# Patient Record
Sex: Female | Born: 1960
Health system: Southern US, Community
[De-identification: ages and names within clinical notes are randomized; demographics above are authoritative.]

## PROBLEM LIST (undated history)

## (undated) DIAGNOSIS — Z972 Presence of dental prosthetic device (complete) (partial): Secondary | ICD-10-CM

## (undated) DIAGNOSIS — K219 Gastro-esophageal reflux disease without esophagitis: Secondary | ICD-10-CM

## (undated) DIAGNOSIS — I10 Essential (primary) hypertension: Secondary | ICD-10-CM

## (undated) DIAGNOSIS — I6523 Occlusion and stenosis of bilateral carotid arteries: Secondary | ICD-10-CM

## (undated) DIAGNOSIS — W57XXXA Bitten or stung by nonvenomous insect and other nonvenomous arthropods, initial encounter: Secondary | ICD-10-CM

## (undated) DIAGNOSIS — F419 Anxiety disorder, unspecified: Secondary | ICD-10-CM

## (undated) DIAGNOSIS — R42 Dizziness and giddiness: Secondary | ICD-10-CM

## (undated) DIAGNOSIS — J439 Emphysema, unspecified: Secondary | ICD-10-CM

## (undated) DIAGNOSIS — E7800 Pure hypercholesterolemia, unspecified: Secondary | ICD-10-CM

## (undated) HISTORY — DX: Occlusion and stenosis of bilateral carotid arteries: I65.23

## (undated) HISTORY — PX: HEMORRHOID SURGERY: SHX153

## (undated) HISTORY — DX: Bitten or stung by nonvenomous insect and other nonvenomous arthropods, initial encounter: W57.XXXA

## (undated) HISTORY — DX: Anxiety disorder, unspecified: F41.9

## (undated) HISTORY — PX: ABDOMINAL HYSTERECTOMY: SHX81

## (undated) HISTORY — DX: Emphysema, unspecified: J43.9

---

## 2004-04-03 ENCOUNTER — Emergency Department: Payer: Self-pay | Admitting: Emergency Medicine

## 2006-01-17 ENCOUNTER — Ambulatory Visit: Payer: Self-pay | Admitting: Unknown Physician Specialty

## 2006-02-16 ENCOUNTER — Ambulatory Visit: Payer: Self-pay

## 2006-11-21 ENCOUNTER — Ambulatory Visit: Payer: Self-pay | Admitting: Emergency Medicine

## 2006-11-23 ENCOUNTER — Ambulatory Visit: Payer: Self-pay | Admitting: Internal Medicine

## 2006-12-03 ENCOUNTER — Ambulatory Visit: Payer: Self-pay | Admitting: Family Medicine

## 2007-02-21 ENCOUNTER — Ambulatory Visit: Payer: Self-pay | Admitting: Family Medicine

## 2009-09-30 ENCOUNTER — Ambulatory Visit: Payer: Self-pay | Admitting: Family Medicine

## 2010-03-17 ENCOUNTER — Emergency Department: Payer: Self-pay | Admitting: Emergency Medicine

## 2011-09-28 ENCOUNTER — Emergency Department: Payer: Self-pay | Admitting: *Deleted

## 2011-09-28 ENCOUNTER — Ambulatory Visit: Payer: Self-pay | Admitting: Family Medicine

## 2012-04-16 ENCOUNTER — Ambulatory Visit: Payer: Self-pay | Admitting: Family Medicine

## 2012-08-12 ENCOUNTER — Ambulatory Visit: Payer: Self-pay | Admitting: Internal Medicine

## 2012-08-13 ENCOUNTER — Ambulatory Visit: Payer: Self-pay | Admitting: Family Medicine

## 2013-11-14 ENCOUNTER — Ambulatory Visit: Payer: Self-pay | Admitting: Gastroenterology

## 2015-01-07 ENCOUNTER — Encounter: Payer: Self-pay | Admitting: *Deleted

## 2015-01-07 ENCOUNTER — Ambulatory Visit
Admission: EM | Admit: 2015-01-07 | Discharge: 2015-01-07 | Disposition: A | Discharge status: Home / Self Care | Payer: Medicare Other | Attending: Internal Medicine | Admitting: Internal Medicine

## 2015-01-07 DIAGNOSIS — L719 Rosacea, unspecified: Secondary | ICD-10-CM

## 2015-01-07 HISTORY — DX: Essential (primary) hypertension: I10

## 2015-01-07 MED ORDER — MUPIROCIN CALCIUM 2 % EX CREA: 1.0000 "application " | TOPICAL_CREAM | Freq: Every day | CUTANEOUS | Status: DC

## 2015-01-07 MED ORDER — METRONIDAZOLE 0.75 % EX LOTN
5.0000 mL | TOPICAL_LOTION | Freq: Every evening | CUTANEOUS | Status: DC | PRN
Start: 2015-01-07 — End: 2015-04-23

## 2015-01-07 MED ORDER — DOXYCYCLINE HYCLATE 100 MG PO CAPS: 100.0000 mg | ORAL_CAPSULE | Freq: Two times a day (BID) | ORAL | Status: DC

## 2015-01-07 NOTE — Discharge Instructions (Signed)
Rosacea Rosacea is a long-term (chronic) condition that affects the skin of the face (cheeks, nose, brow, and chin) and sometimes the eyes. Rosacea causes the blood vessels near the surface of the skin to enlarge, resulting in redness. This condition usually begins after age 55. It occurs most often in light-skinned women. Without treatment, rosacea tends to get worse over time. There is no cure for rosacea, but treatment can help control your symptoms. CAUSES  The cause is unknown. It is thought that some people may inherit a tendency to develop rosacea. Certain triggers can make your rosacea worse, including:  Hot baths.  Exercise.  Sunlight.  Very hot or cold temperatures.  Hot or spicy foods and drinks.  Drinking alcohol.  Stress.  Taking blood pressure medicine.  Long-term use of topical steroids on the face. SYMPTOMS   Redness of the face.  Red bumps or pimples on the face.  Red, enlarged nose (rhinophyma).  Blushing easily.  Red lines on the skin.  Irritated or burning feeling in the eyes.  Swollen eyelids. DIAGNOSIS  Your caregiver can usually tell what is wrong by asking about your symptoms and performing a physical exam. TREATMENT  Avoiding triggers is an important part of treatment. You will also need to see a skin specialist (dermatologist) who can develop a treatment plan for you. The goals of treatment are to control your condition and to improve the appearance of your skin. It may take several weeks or months of treatment before you notice an improvement in your skin. Even after your skin improves, you will likely need to continue treatment to prevent your rosacea from coming back. Treatment methods may include:  Using sunscreen or sunblock daily to protect the skin.  Antibiotic medicine, such as metronidazole, applied directly to the skin.  Antibiotics taken by mouth. This is usually prescribed if you have eye problems from your rosacea.  Laser surgery  to improve the appearance of the skin. This surgery can reduce the appearance of red lines on the skin and can remove excess tissue from the nose to reduce its size. HOME CARE INSTRUCTIONS  Avoid things that seem to trigger your flare-ups.  If you are given antibiotics, take them as directed. Finish them even if you start to feel better.  Use a gentle facial cleanser that does not contain alcohol.  You may use a mild facial moisturizer.  Use a sunscreen or sunblock with SPF 30 or greater.  Wear a green-tinted foundation powder to conceal redness, if needed. Choose cosmetics that are noncomedogenic. This means they do not block your pores.  If your eyelids are affected, apply warm compresses to the eyelids. Do this up to 4 times a day or as directed by your caregiver. SEEK MEDICAL CARE IF:  Your skin problems get worse.  You feel depressed.  You lose your appetite.  You have trouble concentrating.  You have problems with your eyes, such as redness or itching. MAKE SURE YOU:  Understand these instructions.  Will watch your condition.  Will get help right away if you are not doing well or get worse. Document Released: 07/20/2004 Document Revised: 12/12/2011 Document Reviewed: 05/23/2011 [facility] Patient Information 2015 [place], [place]. This information is not intended to replace advice given to you by your health care provider. Make sure you discuss any questions you have with your health care provider.

## 2015-01-07 NOTE — ED Provider Notes (Signed)
CSN: [id number]     Arrival date & time 01/07/15  1709 History   First MD Initiated Contact with Patient 01/07/15 1750     Chief Complaint  Patient presents with  . Rash   (Consider location/radiation/quality/duration/timing/severity/associated sxs/prior Treatment) HPI   54 yo F presents concerned about rash noted on her cheeks increasing over the past few weeks. Under a lot of stress with issues her daughter is coping with. Used to be a smoker but stopped years ago. Stays inside a lot. Has been":picking at the bumps" on her face, everything has increased as the daughter's stress has increased. Not drinking enough water Past Medical History  Diagnosis Date  . Hypertension    History reviewed. No pertinent past surgical history. History reviewed. No pertinent family history. History  Substance Use Topics  . Smoking status: Former Research scientist (life sciences)  . Smokeless tobacco: Not on file  . Alcohol Use: No   OB History    No data available     Review of Systems Constitutional -afebrile Eyes-denies visual changes ENT- normal voice,denies sore throat CV-denies chest pain Resp-denies SOB GI- negative for nausea,vomiting, diarrhea GU- negative for dysuria MSK- negative for back pain, ambulatory Skin- see HPI Neuro- negative headache,focal weakness or numbness   Allergies  Penicillins  Home Medications   Prior to Admission medications   Medication Sig Start Date End Date Taking? Authorizing Provider  busPIRone (BUSPAR) 5 MG tablet Take 5 mg by mouth as needed.   Yes Historical Provider, MD  doxycycline (VIBRAMYCIN) 100 MG capsule Take 1 capsule (100 mg total) by mouth 2 (two) times daily. 01/07/15   Jan Fireman, PA-C  METRONIDAZOLE, TOPICAL, 0.75 % LOTN Apply 5 mLs topically at bedtime as needed. 01/07/15   Jan Fireman, PA-C  mupirocin cream (BACTROBAN) 2 % Apply 1 application topically daily. Very small amount to areas of infection once daily- fill RX with ointment ! 01/07/15   Jan Fireman,  PA-C   BP 132/81 mmHg  Pulse 91  Temp(Src) 97.7 F (36.5 C) (Oral)  Ht 5\' 2"  (1.575 m)  Wt 125 lb (56.7 kg)  BMI 22.86 kg/m2  SpO2 98% Physical Exam   Constitutional -alert and oriented,well appearing and in no acute distress Head-atraumatic, normocephalic Eyes- conjunctiva normal, EOMI ,conjugate gaze Nose- no congestion or rhinorrhea Mouth/throat- mucous membranes moist , Neck- supple without glandular enlargement CV- regular rate, grossly normal heart sounds,  Resp-no distress, normal respiratory effort, GI- ,no distention GU- not examined MSK- no tender, normal ROM, all extremities, ambulatory, self-care Neuro- normal speech and language, no gross focal neurological deficit appreciated,  Skin-warm,dry ,intact; facial erythema, mild malar rash noted both cheeks, reports tiny pustules she has been squeezing, right nasolabial fold mild inflammation; has infected inclusion cyst left upper arm that has been pinched and stuck with needles Psych-mood and affect grossly normal; speech and behavior grossly normal- habit of frequently touching face ED Course  Procedures (including critical care time) Labs Review Labs Reviewed - No data to display  Imaging Review No results found.   MDM   1. Rosacea    Plan: 1.  diagnosis reviewed with patient-discussed Rosacea and adult acne aggravated by handtouch contamination and picking at it. Informational handouts reviewed and given to patient. Gentle face washing with non-irritating facial washes. May use mupirocin specifically on pustules and on lesion left upper arm.  2. Rx as per orders;oral Rx for a week and topical metronidazole lotion fot face with clean hands QHS risks, benefits, potential  side effects reviewed with patient 3. Recommend supportive treatment with increased water-no soda-improved sleep hygiene-wash pillowcases twice weekly-keep hands off face  4.Coordinate FU with PCP so referral can be made to dermatology and she can  have a chain of care. 4. F/u prn if symptoms worsen or don't improve   Discharge Medication List as of 01/07/2015  6:17 PM    START taking these medications   Details  doxycycline (VIBRAMYCIN) 100 MG capsule Take 1 capsule (100 mg total) by mouth 2 (two) times daily., Starting 01/07/2015, Until Discontinued, Print    METRONIDAZOLE, TOPICAL, 0.75 % LOTN Apply 5 mLs topically at bedtime as needed., Starting 01/07/2015, Until Discontinued, Print    mupirocin cream (BACTROBAN) 2 % Apply 1 application topically daily. Very small amount to areas of infection once daily- fill RX with ointment !, Starting 01/07/2015, Until Discontinued, Print         Jan Fireman, PA-C 01/09/15 1959

## 2015-01-07 NOTE — ED Notes (Signed)
Pt states "rash started about 1 week ago, on right cheek, felt a knot under skin before it came to surface"

## 2015-03-05 DIAGNOSIS — M531 Cervicobrachial syndrome: Secondary | ICD-10-CM | POA: Diagnosis not present

## 2015-03-05 DIAGNOSIS — M9902 Segmental and somatic dysfunction of thoracic region: Secondary | ICD-10-CM | POA: Diagnosis not present

## 2015-03-05 DIAGNOSIS — M9901 Segmental and somatic dysfunction of cervical region: Secondary | ICD-10-CM | POA: Diagnosis not present

## 2015-03-05 DIAGNOSIS — G540 Brachial plexus disorders: Secondary | ICD-10-CM | POA: Diagnosis not present

## 2015-03-12 DIAGNOSIS — M9902 Segmental and somatic dysfunction of thoracic region: Secondary | ICD-10-CM | POA: Diagnosis not present

## 2015-03-12 DIAGNOSIS — M9901 Segmental and somatic dysfunction of cervical region: Secondary | ICD-10-CM | POA: Diagnosis not present

## 2015-03-12 DIAGNOSIS — G540 Brachial plexus disorders: Secondary | ICD-10-CM | POA: Diagnosis not present

## 2015-03-12 DIAGNOSIS — M531 Cervicobrachial syndrome: Secondary | ICD-10-CM | POA: Diagnosis not present

## 2015-03-15 DIAGNOSIS — G540 Brachial plexus disorders: Secondary | ICD-10-CM | POA: Diagnosis not present

## 2015-03-15 DIAGNOSIS — M531 Cervicobrachial syndrome: Secondary | ICD-10-CM | POA: Diagnosis not present

## 2015-03-15 DIAGNOSIS — M9902 Segmental and somatic dysfunction of thoracic region: Secondary | ICD-10-CM | POA: Diagnosis not present

## 2015-03-15 DIAGNOSIS — M9901 Segmental and somatic dysfunction of cervical region: Secondary | ICD-10-CM | POA: Diagnosis not present

## 2015-03-17 DIAGNOSIS — M9901 Segmental and somatic dysfunction of cervical region: Secondary | ICD-10-CM | POA: Diagnosis not present

## 2015-03-17 DIAGNOSIS — M9902 Segmental and somatic dysfunction of thoracic region: Secondary | ICD-10-CM | POA: Diagnosis not present

## 2015-03-17 DIAGNOSIS — G540 Brachial plexus disorders: Secondary | ICD-10-CM | POA: Diagnosis not present

## 2015-03-17 DIAGNOSIS — M531 Cervicobrachial syndrome: Secondary | ICD-10-CM | POA: Diagnosis not present

## 2015-03-24 DIAGNOSIS — M531 Cervicobrachial syndrome: Secondary | ICD-10-CM | POA: Diagnosis not present

## 2015-03-24 DIAGNOSIS — G540 Brachial plexus disorders: Secondary | ICD-10-CM | POA: Diagnosis not present

## 2015-03-24 DIAGNOSIS — M9901 Segmental and somatic dysfunction of cervical region: Secondary | ICD-10-CM | POA: Diagnosis not present

## 2015-03-24 DIAGNOSIS — M9902 Segmental and somatic dysfunction of thoracic region: Secondary | ICD-10-CM | POA: Diagnosis not present

## 2015-04-23 ENCOUNTER — Ambulatory Visit (INDEPENDENT_AMBULATORY_CARE_PROVIDER_SITE_OTHER): Payer: Medicare Other | Admitting: Family Medicine

## 2015-04-23 ENCOUNTER — Encounter: Payer: Self-pay | Admitting: Family Medicine

## 2015-04-23 VITALS — BP 111/75 | HR 82 | Temp 98.2°F | Ht 60.25 in | Wt 143.2 lb

## 2015-04-23 DIAGNOSIS — B349 Viral infection, unspecified: Secondary | ICD-10-CM

## 2015-04-23 DIAGNOSIS — M542 Cervicalgia: Secondary | ICD-10-CM | POA: Diagnosis not present

## 2015-04-23 DIAGNOSIS — E785 Hyperlipidemia, unspecified: Secondary | ICD-10-CM | POA: Diagnosis not present

## 2015-04-23 DIAGNOSIS — Z720 Tobacco use: Secondary | ICD-10-CM

## 2015-04-23 DIAGNOSIS — Z5181 Encounter for therapeutic drug level monitoring: Secondary | ICD-10-CM

## 2015-04-23 DIAGNOSIS — I10 Essential (primary) hypertension: Secondary | ICD-10-CM

## 2015-04-23 DIAGNOSIS — R635 Abnormal weight gain: Secondary | ICD-10-CM

## 2015-04-23 DIAGNOSIS — M25551 Pain in right hip: Secondary | ICD-10-CM | POA: Diagnosis not present

## 2015-04-23 DIAGNOSIS — K5909 Other constipation: Secondary | ICD-10-CM | POA: Diagnosis not present

## 2015-04-23 MED ORDER — VARENICLINE TARTRATE 0.5 MG X 11 & 1 MG X 42 PO MISC: ORAL | Status: DC

## 2015-04-23 MED ORDER — NAPROXEN 375 MG PO TABS: 375.0000 mg | ORAL_TABLET | Freq: Two times a day (BID) | ORAL | Status: DC

## 2015-04-23 MED ORDER — BUSPIRONE HCL 5 MG PO TABS: 2.5000 mg | ORAL_TABLET | Freq: Three times a day (TID) | ORAL | Status: DC | PRN

## 2015-04-23 MED ORDER — LISINOPRIL-HYDROCHLOROTHIAZIDE 10-12.5 MG PO TABS: 0.5000 | ORAL_TABLET | Freq: Every day | ORAL | Status: DC

## 2015-04-23 NOTE — Patient Instructions (Addendum)
Try to use PLAIN allergy medicine without the decongestant Avoid: phenylephrine, phenylpropanolamine, and pseudoephredine Try vitamin C (orange juice if not diabetic or vitamin C tablets) and drink green tea to help your immune system during your illness Get plenty of rest and hydration  Return for a complete physical in about a month (or when convenient)  You can have the xray of your neck done at [facility]  Use the new anti-inflammatory for neck and hip discomfort; take it every day for the first week, and then just as needed  Try turmeric as a natural anti-inflammatory (for pain and arthritis). It comes in capsules where you buy aspirin and fish oil, but also as a spice where you buy pepper and garlic powder.  Do start Chantix and then pick a quit date about 7-8 days after and stick with it; when you get near the end of the starter pack, call us and we'll get you the continuing pack prescription (most people take Chantix for about 3 months)  Smoking Cessation, Tips for Success If you are ready to quit smoking, congratulations! You have chosen to help yourself be healthier. Cigarettes bring nicotine, tar, carbon monoxide, and other irritants into your body. Your lungs, heart, and blood vessels will be able to work better without these poisons. There are many different ways to quit smoking. Nicotine gum, nicotine patches, a nicotine inhaler, or nicotine nasal spray can help with physical craving. Hypnosis, support groups, and medicines help break the habit of smoking. WHAT THINGS CAN I DO TO MAKE QUITTING EASIER?  Here are some tips to help you quit for good:  Pick a date when you will quit smoking completely. Tell all of your friends and family about your plan to quit on that date.  Do not try to slowly cut down on the number of cigarettes you are smoking. Pick a quit date and quit smoking completely starting on that day.  Throw away all cigarettes.   Clean and remove  all ashtrays from your home, work, and car.  On a card, write down your reasons for quitting. Carry the card with you and read it when you get the urge to smoke.  Cleanse your body of nicotine. Drink enough water and fluids to keep your urine clear or pale yellow. Do this after quitting to flush the nicotine from your body.  Learn to predict your moods. Do not let a bad situation be your excuse to have a cigarette. Some situations in your life might tempt you into wanting a cigarette.  Never have "just one" cigarette. It leads to wanting another and another. Remind yourself of your decision to quit.  Change habits associated with smoking. If you smoked while driving or when feeling stressed, try other activities to replace smoking. Stand up when drinking your coffee. Brush your teeth after eating. Sit in a different chair when you read the paper. Avoid alcohol while trying to quit, and try to drink fewer caffeinated beverages. Alcohol and caffeine may urge you to smoke.  Avoid foods and drinks that can trigger a desire to smoke, such as sugary or spicy foods and alcohol.  Ask people who smoke not to smoke around you.  Have something planned to do right after eating or having a cup of coffee. For example, plan to take a walk or exercise.  Try a relaxation exercise to calm you down and decrease your stress. Remember, you may be tense and nervous for the first 2 weeks after  you quit, but this will pass.  Find new activities to keep your hands busy. Play with a pen, coin, or rubber band. Doodle or draw things on paper.  Brush your teeth right after eating. This will help cut down on the craving for the taste of tobacco after meals. You can also try mouthwash.   Use oral substitutes in place of cigarettes. Try using lemon drops, carrots, cinnamon sticks, or chewing gum. Keep them handy so they are available when you have the urge to smoke.  When you have the urge to smoke, try deep  breathing.  Designate your home as a nonsmoking area.  If you are a heavy smoker, ask your health care provider about a prescription for nicotine chewing gum. It can ease your withdrawal from nicotine.  Reward yourself. Set aside the cigarette money you save and buy yourself something nice.  Look for support from others. Join a support group or smoking cessation program. Ask someone at home or at work to help you with your plan to quit smoking.  Always ask yourself, "Do I need this cigarette or is this just a reflex?" Tell yourself, "Today, I choose not to smoke," or "I do not want to smoke." You are reminding yourself of your decision to quit.  Do not replace cigarette smoking with electronic cigarettes (commonly called e-cigarettes). The safety of e-cigarettes is unknown, and some may contain harmful chemicals.  If you relapse, do not give up! Plan ahead and think about what you will do the next time you get the urge to smoke. HOW WILL I FEEL WHEN I QUIT SMOKING? You may have symptoms of withdrawal because your body is used to nicotine (the addictive substance in cigarettes). You may crave cigarettes, be irritable, feel very hungry, cough often, get headaches, or have difficulty concentrating. The withdrawal symptoms are only temporary. They are strongest when you first quit but will go away within 10-14 days. When withdrawal symptoms occur, stay in control. Think about your reasons for quitting. Remind yourself that these are signs that your body is healing and getting used to being without cigarettes. Remember that withdrawal symptoms are easier to treat than the major diseases that smoking can cause.  Even after the withdrawal is over, expect periodic urges to smoke. However, these cravings are generally short lived and will go away whether you smoke or not. Do not smoke! WHAT RESOURCES ARE AVAILABLE TO HELP ME QUIT SMOKING? Your health care provider can direct you to community resources or  hospitals for support, which may include:  Group support.  Education.  Hypnosis.  Therapy.   This information is not intended to replace advice given to you by your health care provider. Make sure you discuss any questions you have with your health care provider.   Document Released: 03/10/2004 Document Revised: 07/03/2014 Document Reviewed: 11/28/2012 Elsevier Interactive Patient Education [employer].
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
## 2015-04-24 LAB — CBC WITH DIFFERENTIAL/PLATELET
BASOS: 1 %
Basophils Absolute: 0.1 10*3/uL (ref 0.0–0.2)
EOS (ABSOLUTE): 0.2 10*3/uL (ref 0.0–0.4)
EOS: 3 %
HEMATOCRIT: 44.2 % (ref 34.0–46.6)
HEMOGLOBIN: 15.4 g/dL (ref 11.1–15.9)
IMMATURE GRANS (ABS): 0 10*3/uL (ref 0.0–0.1)
IMMATURE GRANULOCYTES: 0 %
LYMPHS: 42 %
Lymphocytes Absolute: 3.9 10*3/uL — ABNORMAL HIGH (ref 0.7–3.1)
MCH: 28.9 pg (ref 26.6–33.0)
MCHC: 34.8 g/dL (ref 31.5–35.7)
MCV: 83 fL (ref 79–97)
MONOCYTES: 12 %
Monocytes Absolute: 1.1 10*3/uL — ABNORMAL HIGH (ref 0.1–0.9)
NEUTROS PCT: 42 %
Neutrophils Absolute: 3.8 10*3/uL (ref 1.4–7.0)
Platelets: 235 10*3/uL (ref 150–379)
RBC: 5.32 x10E6/uL — ABNORMAL HIGH (ref 3.77–5.28)
RDW: 14.2 % (ref 12.3–15.4)
WBC: 9.1 10*3/uL (ref 3.4–10.8)

## 2015-04-24 LAB — LIPID PANEL W/O CHOL/HDL RATIO
Cholesterol, Total: 277 mg/dL — ABNORMAL HIGH (ref 100–199)
HDL: 45 mg/dL (ref >39)
LDL CALC: 186 mg/dL — AB (ref 0–99)
Triglycerides: 231 mg/dL — ABNORMAL HIGH (ref 0–149)
VLDL CHOLESTEROL CAL: 46 mg/dL — AB (ref 5–40)

## 2015-04-24 LAB — TSH: TSH: 1.09 u[IU]/mL (ref 0.450–4.500)

## 2015-04-24 LAB — COMPREHENSIVE METABOLIC PANEL
ALT: 45 IU/L — AB (ref 0–32)
AST: 27 IU/L (ref 0–40)
Albumin/Globulin Ratio: 1.8 (ref 1.1–2.5)
Albumin: 4.3 g/dL (ref 3.5–5.5)
Alkaline Phosphatase: 132 IU/L — ABNORMAL HIGH (ref 39–117)
BUN/Creatinine Ratio: 22 (ref 9–23)
BUN: 17 mg/dL (ref 6–24)
Bilirubin Total: 0.4 mg/dL (ref 0.0–1.2)
CO2: 24 mmol/L (ref 18–29)
Calcium: 9.2 mg/dL (ref 8.7–10.2)
Chloride: 102 mmol/L (ref 97–106)
Creatinine, Ser: 0.79 mg/dL (ref 0.57–1.00)
GFR calc Af Amer: 97 mL/min/{1.73_m2} (ref >59)
GFR, EST NON AFRICAN AMERICAN: 85 mL/min/{1.73_m2} (ref >59)
Globulin, Total: 2.4 g/dL (ref 1.5–4.5)
Glucose: 91 mg/dL (ref 65–99)
Potassium: 4.1 mmol/L (ref 3.5–5.2)
Sodium: 141 mmol/L (ref 136–144)
Total Protein: 6.7 g/dL (ref 6.0–8.5)

## 2015-04-28 DIAGNOSIS — Z5181 Encounter for therapeutic drug level monitoring: Secondary | ICD-10-CM | POA: Insufficient documentation

## 2015-04-28 DIAGNOSIS — E785 Hyperlipidemia, unspecified: Secondary | ICD-10-CM | POA: Insufficient documentation

## 2015-04-28 DIAGNOSIS — R635 Abnormal weight gain: Secondary | ICD-10-CM | POA: Insufficient documentation

## 2015-04-28 DIAGNOSIS — Z720 Tobacco use: Secondary | ICD-10-CM | POA: Insufficient documentation

## 2015-04-28 DIAGNOSIS — K5900 Constipation, unspecified: Secondary | ICD-10-CM | POA: Insufficient documentation

## 2015-04-28 DIAGNOSIS — I10 Essential (primary) hypertension: Secondary | ICD-10-CM | POA: Insufficient documentation

## 2015-04-28 DIAGNOSIS — M25559 Pain in unspecified hip: Secondary | ICD-10-CM | POA: Insufficient documentation

## 2015-04-28 NOTE — Assessment & Plan Note (Signed)
Check thyroid studies 

## 2015-04-28 NOTE — Assessment & Plan Note (Signed)
Imaging ordered, suspect arthritis; no radiculopathy, trial of anti-inflammatory

## 2015-04-28 NOTE — Assessment & Plan Note (Signed)
She is interested in quitting smoking; discussed options; will start Chantix; Rx sent; see AVS

## 2015-04-28 NOTE — Assessment & Plan Note (Signed)
Fiber and hydration; check TSH

## 2015-04-28 NOTE — Assessment & Plan Note (Signed)
Check lipid panel; decrease sat fats

## 2015-04-28 NOTE — Assessment & Plan Note (Signed)
Check creatinine, K+ on the combo anti-hypertensives

## 2015-04-28 NOTE — Assessment & Plan Note (Signed)
DASH guidelines; continue combo ACE-I/thiazide; weight loss; healthy eating

## 2015-04-28 NOTE — Assessment & Plan Note (Signed)
Will start with anti-inflammatory; pass on xrays at this time; reassess; may be trochanteric bursitis

## 2015-04-30 ENCOUNTER — Ambulatory Visit
Admission: RE | Admit: 2015-04-30 | Discharge: 2015-04-30 | Disposition: A | Discharge status: Home / Self Care | Payer: Medicare Other | Source: Ambulatory Visit | Attending: Family Medicine | Admitting: Family Medicine

## 2015-04-30 DIAGNOSIS — M542 Cervicalgia: Secondary | ICD-10-CM | POA: Insufficient documentation

## 2015-05-03 ENCOUNTER — Telehealth: Payer: Self-pay | Admitting: Family Medicine

## 2015-05-03 DIAGNOSIS — M542 Cervicalgia: Secondary | ICD-10-CM

## 2015-05-03 DIAGNOSIS — E785 Hyperlipidemia, unspecified: Secondary | ICD-10-CM

## 2015-05-03 DIAGNOSIS — D72821 Monocytosis (symptomatic): Secondary | ICD-10-CM

## 2015-05-03 DIAGNOSIS — D72820 Lymphocytosis (symptomatic): Secondary | ICD-10-CM

## 2015-05-03 MED ORDER — ATORVASTATIN CALCIUM 20 MG PO TABS: 20.0000 mg | ORAL_TABLET | Freq: Every day | ORAL | Status: DC

## 2015-05-03 NOTE — Telephone Encounter (Signed)
Please let patient know lab results Her cholesterol is quite high; total 277 and LDL 186; for these numbers, I will recommend starting medicine; I sent in the Rx, but if she wants to wait and talk with me first, I understand; we'll see her later this month at appt A few of her white blood cells are up a little, so we'll recheck that at her appt; viruses, illness can cause those to go up Two liver enzymes are a little up, but my guess is likely fatty and related to her cholesterol; I hope that lowering her cholesterol will actually help her liver tests come back down; if she starts the cholesterol medicine and develops ANY abdominal pain, nausea, jaundice, then stop it right away and seek medical attention Her other tests are okay

## 2015-05-03 NOTE — Assessment & Plan Note (Signed)
LDL 186

## 2015-05-03 NOTE — Assessment & Plan Note (Signed)
With limited ROM; MRI C-spine

## 2015-05-03 NOTE — Telephone Encounter (Signed)
Patient notified. She is willing to start the cholesterol med.

## 2015-05-18 ENCOUNTER — Telehealth (HOSPITAL_COMMUNITY): Payer: Self-pay | Admitting: Family Medicine

## 2015-05-19 ENCOUNTER — Ambulatory Visit: Payer: Medicare Other

## 2015-05-25 ENCOUNTER — Ambulatory Visit (INDEPENDENT_AMBULATORY_CARE_PROVIDER_SITE_OTHER): Payer: Medicare Other | Admitting: Family Medicine

## 2015-05-25 ENCOUNTER — Encounter: Payer: Self-pay | Admitting: Family Medicine

## 2015-05-25 VITALS — BP 148/90 | HR 80 | Temp 98.0°F | Ht 60.5 in | Wt 145.0 lb

## 2015-05-25 DIAGNOSIS — Z720 Tobacco use: Secondary | ICD-10-CM | POA: Diagnosis not present

## 2015-05-25 DIAGNOSIS — I10 Essential (primary) hypertension: Secondary | ICD-10-CM | POA: Diagnosis not present

## 2015-05-25 DIAGNOSIS — M25551 Pain in right hip: Secondary | ICD-10-CM | POA: Diagnosis not present

## 2015-05-25 DIAGNOSIS — Z1239 Encounter for other screening for malignant neoplasm of breast: Secondary | ICD-10-CM | POA: Diagnosis not present

## 2015-05-25 DIAGNOSIS — I8393 Asymptomatic varicose veins of bilateral lower extremities: Secondary | ICD-10-CM

## 2015-05-25 DIAGNOSIS — I8390 Asymptomatic varicose veins of unspecified lower extremity: Secondary | ICD-10-CM | POA: Insufficient documentation

## 2015-05-25 DIAGNOSIS — E663 Overweight: Secondary | ICD-10-CM | POA: Diagnosis not present

## 2015-05-25 DIAGNOSIS — Z0000 Encounter for general adult medical examination without abnormal findings: Secondary | ICD-10-CM

## 2015-05-25 NOTE — Patient Instructions (Addendum)
Your goal blood pressure is less than 140 mmHg on top. Try to follow the DASH guidelines (DASH stands for Dietary Approaches to Stop Hypertension) Try to limit the sodium in your diet.  Ideally, consume less than 1.5 grams (less than 1,571m) per day. Do not add salt when cooking or at the table.  Check the sodium amount on labels when shopping, and choose items lower in sodium when given a choice. Avoid or limit foods that already contain a lot of sodium. Eat a diet rich in fruits and vegetables and whole grains. Monitor your pressure and call me if not to goal Please do call to schedule your mammogram; the number [facility] is [phone] I do recommend yearly flu shots; for individuals who don't want flu shots, try to practice excellent hand hygiene, and avoid nursing homes, day cares, and hospitals during peak flu season; taking vitamin C daily during flu/cold season may help boost your immune system too  Do take 800 to 1000 iu vitamin D3 daily  Stop Chantix  Pick a quit date to give up all cigarettes  We'll have you see the vascular doctor  We'll see what the MRI shows  If you decide to see physical therapy or an orthopaedist about your hip and leg, just let me know and there will be ann open invitation  Health Maintenance, Female Adopting a healthy lifestyle and getting preventive care can go a long way to promote health and wellness. Talk with your health care provider about what schedule of regular examinations is right for you. This is a good chance for you to check in with your provider about disease prevention and staying healthy. In between checkups, there are plenty of things you can do on your own. Experts have done a lot of research about which lifestyle changes and preventive measures are most likely to keep you healthy. Ask your health care provider for more information. WEIGHT AND DIET  Eat a healthy  diet  Be sure to include plenty of vegetables, fruits, low-fat dairy products, and lean protein.  Do not eat a lot of foods high in solid fats, added sugars, or salt.  Get regular exercise. This is one of the most important things you can do for your health.  Most adults should exercise for at least 150 minutes each week. The exercise should increase your heart rate and make you sweat (moderate-intensity exercise).  Most adults should also do strengthening exercises at least twice a week. This is in addition to the moderate-intensity exercise.  Maintain a healthy weight  Body mass index (BMI) is a measurement that can be used to identify possible weight problems. It estimates body fat based on height and weight. Your health care provider can help determine your BMI and help you achieve or maintain a healthy weight.  For females 55years of age and older:   A BMI below 18.5 is considered underweight.  A BMI of 18.5 to 24.9 is normal.  A BMI of 25 to 29.9 is considered overweight.  A BMI of 30 and above is considered obese.  Watch levels of cholesterol and blood lipids  You should start having your blood tested for lipids and cholesterol at 55years of age, then have this test every 5 years.  You may need to have your cholesterol levels checked more often if:  Your lipid or cholesterol levels are high.  You are older than 55  years of age.  You are at high risk for heart disease.  CANCER SCREENING   Lung Cancer  Lung cancer screening is recommended for adults 55-76 years old who are at high risk for lung cancer because of a history of smoking.  A yearly low-dose CT scan of the lungs is recommended for people who:  Currently smoke.  Have quit within the past 15 years.  Have at least a 30-pack-year history of smoking. A pack year is smoking an average of one pack of cigarettes a day for 1 year.  Yearly screening should continue until it has been 15 years since you  quit.  Yearly screening should stop if you develop a health problem that would prevent you from having lung cancer treatment.  Breast Cancer  Practice breast self-awareness. This means understanding how your breasts normally appear and feel.  It also means doing regular breast self-exams. Let your health care provider know about any changes, no matter how small.  If you are in your 55s or 30s, you should have a clinical breast exam (CBE) by a health care provider every 1-3 years as part of a regular health exam.  If you are 55 or older, have a CBE every year. Also consider having a breast X-ray (mammogram) every year.  If you have a family history of breast cancer, talk to your health care provider about genetic screening.  If you are at high risk for breast cancer, talk to your health care provider about having an MRI and a mammogram every year.  Breast cancer gene (BRCA) assessment is recommended for women who have family members with BRCA-related cancers. BRCA-related cancers include:  Breast.  Ovarian.  Tubal.  Peritoneal cancers.  Results of the assessment will determine the need for genetic counseling and BRCA1 and BRCA2 testing. Cervical Cancer Your health care provider may recommend that you be screened regularly for cancer of the pelvic organs (ovaries, uterus, and vagina). This screening involves a pelvic examination, including checking for microscopic changes to the surface of your cervix (Pap test). You may be encouraged to have this screening done every 3 years, beginning at age 55.  For women ages 55-65, health care providers may recommend pelvic exams and Pap testing every 3 years, or they may recommend the Pap and pelvic exam, combined with testing for human papilloma virus (HPV), every 5 years. Some types of HPV increase your risk of cervical cancer. Testing for HPV may also be done on women of any age with unclear Pap test results.  Other health care providers may  not recommend any screening for nonpregnant women who are considered low risk for pelvic cancer and who do not have symptoms. Ask your health care provider if a screening pelvic exam is right for you.  If you have had past treatment for cervical cancer or a condition that could lead to cancer, you need Pap tests and screening for cancer for at least 20 years after your treatment. If Pap tests have been discontinued, your risk factors (such as having a new sexual partner) need to be reassessed to determine if screening should resume. Some women have medical problems that increase the chance of getting cervical cancer. In these cases, your health care provider may recommend more frequent screening and Pap tests. Colorectal Cancer  This type of cancer can be detected and often prevented.  Routine colorectal cancer screening usually begins at 55 years of age and continues through 55 years of age.  Your health care  provider may recommend screening at an earlier age if you have risk factors for colon cancer.  Your health care provider may also recommend using home test kits to check for hidden blood in the stool.  A small camera at the end of a tube can be used to examine your colon directly (sigmoidoscopy or colonoscopy). This is done to check for the earliest forms of colorectal cancer.  Routine screening usually begins at age 43.  Direct examination of the colon should be repeated every 5-10 years through 55 years of age. However, you may need to be screened more often if early forms of precancerous polyps or small growths are found. Skin Cancer  Check your skin from head to toe regularly.  Tell your health care provider about any new moles or changes in moles, especially if there is a change in a mole's shape or color.  Also tell your health care provider if you have a mole that is larger than the size of a pencil eraser.  Always use sunscreen. Apply sunscreen liberally and repeatedly  throughout the day.  Protect yourself by wearing long sleeves, pants, a wide-brimmed hat, and sunglasses whenever you are outside. HEART DISEASE, DIABETES, AND HIGH BLOOD PRESSURE   High blood pressure causes heart disease and increases the risk of stroke. High blood pressure is more likely to develop in:  People who have blood pressure in the high end of the normal range (130-139/85-89 mm Hg).  People who are overweight or obese.  People who are African American.  If you are 66-87 years of age, have your blood pressure checked every 3-5 years. If you are 42 years of age or older, have your blood pressure checked every year. You should have your blood pressure measured twice--once when you are at a hospital or clinic, and once when you are not at a hospital or clinic. Record the average of the two measurements. To check your blood pressure when you are not at a hospital or clinic, you can use:  An automated blood pressure machine at a pharmacy.  A home blood pressure monitor.  If you are between 69 years and 19 years old, ask your health care provider if you should take aspirin to prevent strokes.  Have regular diabetes screenings. This involves taking a blood sample to check your fasting blood sugar level.  If you are at a normal weight and have a low risk for diabetes, have this test once every three years after 55 years of age.  If you are overweight and have a high risk for diabetes, consider being tested at a younger age or more often. PREVENTING INFECTION  Hepatitis B  If you have a higher risk for hepatitis B, you should be screened for this virus. You are considered at high risk for hepatitis B if:  You were born in a country where hepatitis B is common. Ask your health care provider which countries are considered high risk.  Your parents were born in a high-risk country, and you have not been immunized against hepatitis B (hepatitis B vaccine).  You have HIV or  AIDS.  You use needles to inject street drugs.  You live with someone who has hepatitis B.  You have had sex with someone who has hepatitis B.  You get hemodialysis treatment.  You take certain medicines for conditions, including cancer, organ transplantation, and autoimmune conditions. Hepatitis C  Blood testing is recommended for:  Everyone born from [birth year] through 1966.  Anyone with  known risk factors for hepatitis C. Sexually transmitted infections (STIs)  You should be screened for sexually transmitted infections (STIs) including gonorrhea and chlamydia if:  You are sexually active and are younger than 55 years of age.  You are older than 55 years of age and your health care provider tells you that you are at risk for this type of infection.  Your sexual activity has changed since you were last screened and you are at an increased risk for chlamydia or gonorrhea. Ask your health care provider if you are at risk.  If you do not have HIV, but are at risk, it may be recommended that you take a prescription medicine daily to prevent HIV infection. This is called pre-exposure prophylaxis (PrEP). You are considered at risk if:  You are sexually active and do not regularly use condoms or know the HIV status of your partner(s).  You take drugs by injection.  You are sexually active with a partner who has HIV. Talk with your health care provider about whether you are at high risk of being infected with HIV. If you choose to begin PrEP, you should first be tested for HIV. You should then be tested every 3 months for as long as you are taking PrEP.  PREGNANCY   If you are premenopausal and you may become pregnant, ask your health care provider about preconception counseling.  If you may become pregnant, take 400 to 800 micrograms (mcg) of folic acid every day.  If you want to prevent pregnancy, talk to your health care provider about birth control (contraception). OSTEOPOROSIS AND  MENOPAUSE   Osteoporosis is a disease in which the bones lose minerals and strength with aging. This can result in serious bone fractures. Your risk for osteoporosis can be identified using a bone density scan.  If you are 17 years of age or older, or if you are at risk for osteoporosis and fractures, ask your health care provider if you should be screened.  Ask your health care provider whether you should take a calcium or vitamin D supplement to lower your risk for osteoporosis.  Menopause may have certain physical symptoms and risks.  Hormone replacement therapy may reduce some of these symptoms and risks. Talk to your health care provider about whether hormone replacement therapy is right for you.  HOME CARE INSTRUCTIONS   Schedule regular health, dental, and eye exams.  Stay current with your immunizations.   Do not use any tobacco products including cigarettes, chewing tobacco, or electronic cigarettes.  If you are pregnant, do not drink alcohol.  If you are breastfeeding, limit how much and how often you drink alcohol.  Limit alcohol intake to no more than 1 drink per day for nonpregnant women. One drink equals 12 ounces of beer, 5 ounces of wine, or 1 ounces of hard liquor.  Do not use street drugs.  Do not share needles.  Ask your health care provider for help if you need support or information about quitting drugs.  Tell your health care provider if you often feel depressed.  Tell your health care provider if you have ever been abused or do not feel safe at home.   This information is not intended to replace advice given to you by your health care provider. Make sure you discuss any questions you have with your health care provider.   Document Released: 12/26/2010 Document Revised: 07/03/2014 Document Reviewed: 05/14/2013 Elsevier Interactive Patient Education [employer].

## 2015-05-25 NOTE — Assessment & Plan Note (Addendum)
Yearly chest CT, explained low dose CT scan for lung cancer screening; encouraged smoking cessation; not tolerating Chantix, so stop this

## 2015-05-25 NOTE — Progress Notes (Signed)
Patient ID: Allison Pham, female   DOB: [birth year], 54 y.o.   MRN: [id number]   Subjective:   Allison Pham is a 54 y.o. female here for a complete physical exam  Interim issues since last visit: no medical excitement  USPSTF grade A and B recommendations Alcohol: yes, just socially Depression:  Depression screen [facility] 2/9 05/25/2015 04/23/2015  Decreased Interest 1 2  Down, Depressed, Hopeless 1 0  PHQ - 2 Score 2 2  Altered sleeping - 2  Tired, decreased energy - 3  Change in appetite - 0  Feeling bad or failure about yourself  - 0  Trouble concentrating - 0  Moving slowly or fidgety/restless - 0  Suicidal thoughts - 0  PHQ-9 Score - 7  Difficult doing work/chores - Somewhat difficult  little bit of issues; on full disability because of her nerves; not crazy about medicine; tries to walk, takes a shower Hypertension: higher than ideal; did not take medicine today Obesity: gaining weight, not as active as before; some weight gain Tobacco use: now on Chantix, still smoking; others smoke in the home too HIV, hep B, hep C: declined today; has had testing in the past STD testing and prevention (chl/gon/syphilis): declined, asx Lipids: done Glucose: done Colorectal cancer: it's been a while, 2013 per chart Breast cancer: due BRCA gene screening: no ovarian cancer; no breast cancer in family Intimate partner violence:no Cervical cancer screening: still has ovaries, s/p hysterectomy (bleeding) Lung cancer: ordered chest CT Osteoporosis: lower risk Fall prevention/vitamin D: recommend 800 to 1000 iu vit D AAA: n/a Aspirin: recommended Diet: recommended healthy diet Exercise: not active Skin cancer: avoid tanning beds  Pain in the right hip down the leg; no back pain; hurts in the left leg from varicose veins; real bad; she went to the specialist, but had trouble with the receptionist She used to have parking sticker for varicose veins; she says it is true that she cannot  walk 200 feet without stopping to rest She also has issues with her stomach She also has something on the tip of her index finger  Past Medical History  Diagnosis Date  . Hypertension   . Anxiety    Past Surgical History  Procedure Laterality Date  . Abdominal hysterectomy      Partial  . Hemorrhoid surgery     Family History  Problem Relation Age of Onset  . Cancer Mother     Colon  . Hypertension Mother   . Hypertension Father   . Heart attack Father   . Heart disease Father   . Hypertension Sister   . COPD Sister   . Hypertension Brother   . Heart disease Brother   . Cancer Maternal Aunt   . Cancer Maternal Uncle   . Diabetes Neg Hx   . Stroke Neg Hx    Social History  Substance Use Topics  . Smoking status: Current Some Day Smoker -- 1.00 packs/day    Types: Cigarettes  . Smokeless tobacco: Never Used  . Alcohol Use: No   Review of Systems  Constitutional: Positive for unexpected weight change.  HENT: Negative for hearing loss.   Eyes: Negative for visual disturbance.  Respiratory: Positive for cough (just a little little bit).   Cardiovascular: Positive for chest pain (brief electric shocks in the chest, just a second).  Gastrointestinal: Negative for blood in stool.  Endocrine: Negative for polydipsia and polyuria.  Genitourinary: Negative for hematuria.  Neurological: Negative for tremors.  Hematological: Negative  for adenopathy. Does not bruise/bleed easily.  Psychiatric/Behavioral: Negative for self-injury. The patient is nervous/anxious.     Objective:   Filed Vitals:   05/25/15 1003  BP: 148/90  Pulse: 80  Temp: 98 F (36.7 C)  Height: 5' 0.5" (1.537 m)  Weight: 145 lb (65.772 kg)  SpO2: 99%   Body mass index is 27.84 kg/(m^2). Wt Readings from Last 3 Encounters:  05/25/15 145 lb (65.772 kg)  04/23/15 143 lb 3.2 oz (64.955 kg)  01/07/15 125 lb (56.7 kg)   Physical Exam  Constitutional: She appears well-developed and well-nourished.   HENT:  Head: Normocephalic and atraumatic.  Right Ear: Hearing, tympanic membrane, external ear and ear canal normal.  Left Ear: Hearing, tympanic membrane, external ear and ear canal normal.  Eyes: Conjunctivae and EOM are normal. Right eye exhibits no hordeolum. Left eye exhibits no hordeolum. No scleral icterus.  Neck: Carotid bruit is not present. No thyromegaly present.  Cardiovascular: Normal rate, regular rhythm, S1 normal, S2 normal and normal heart sounds.   No extrasystoles are present.  Pulmonary/Chest: Effort normal and breath sounds normal. No respiratory distress. Right breast exhibits no inverted nipple, no mass, no nipple discharge, no skin change and no tenderness. Left breast exhibits no inverted nipple, no mass, no nipple discharge, no skin change and no tenderness. Breasts are symmetrical.  Abdominal: Soft. Normal appearance and bowel sounds are normal. She exhibits no distension, no abdominal bruit, no pulsatile midline mass and no mass. There is no hepatosplenomegaly. There is no tenderness. No hernia.  Musculoskeletal: Normal range of motion. She exhibits no edema.  Lymphadenopathy:       Head (right side): No submandibular adenopathy present.       Head (left side): No submandibular adenopathy present.    She has no cervical adenopathy.    She has no axillary adenopathy.  Neurological: She is alert. She displays no tremor. No cranial nerve deficit. She exhibits normal muscle tone. Gait normal.  Reflex Scores:      Patellar reflexes are 2+ on the right side and 2+ on the left side. Skin: Skin is warm and dry. Lesion (verrucous lesion tip of index finger) noted. No bruising and no ecchymosis noted. No cyanosis. No pallor.  Psychiatric: Her speech is normal and behavior is normal. Thought content normal. Her mood appears not anxious. She does not exhibit a depressed mood.    Assessment/Plan:   Problem List Items Addressed This Visit      Cardiovascular and Mediastinum    Essential hypertension, benign    Not quite to goal today; encouraged smoking cessation, weight loss, DASH guidelines; patient to monitor her BP and notify me if not under threshold      Varicose vein of leg    Refer back to vascular specialist      Relevant Orders   Ambulatory referral to Vascular Surgery     Other   Tobacco abuse    Yearly chest CT, explained low dose CT scan for lung cancer screening; encouraged smoking cessation; not tolerating Chantix, so stop this      Relevant Orders   CT CHEST LUNG CA SCREEN LOW DOSE W/O CM   Hip pain    And leg pain; she declined offer for work-up, referral to ortho for this; she wants to wait for the MRI to come back and then decide what to do      Breast cancer screening    SBE taught and encouraged; CBE done today; yearly mammograms (  or patient may opt for mammo every two years)      Relevant Orders   MM DIGITAL SCREENING BILATERAL   Preventative health care - Primary    USPSTF grade A and B recommendations reviewed with patient; age-appropriate recommendations, preventive care, screening tests, etc discussed and encouraged; healthy living encouraged; see AVS for patient education given to patient      Overweight (BMI 25.0-29.9)    Encouraged modest weight loss         Follow up plan: Return in about 1 year (around 05/24/2016) for complete physical, and also 12 weeks after Nov 7th visit and fasting labs.  An after-visit summary was printed and given to the patient at [facility].  Please see the patient instructions which may contain other information and recommendations beyond what is mentioned above in the assessment and plan.  Orders Placed This Encounter  Procedures  . MM DIGITAL SCREENING BILATERAL  . CT CHEST LUNG CA SCREEN LOW DOSE W/O CM  . Ambulatory referral to Vascular Surgery

## 2015-05-29 DIAGNOSIS — E663 Overweight: Secondary | ICD-10-CM | POA: Insufficient documentation

## 2015-05-29 DIAGNOSIS — Z0000 Encounter for general adult medical examination without abnormal findings: Secondary | ICD-10-CM | POA: Insufficient documentation

## 2015-05-29 NOTE — Assessment & Plan Note (Signed)
Not quite to goal today; encouraged smoking cessation, weight loss, DASH guidelines; patient to monitor her BP and notify me if not under threshold

## 2015-05-29 NOTE — Assessment & Plan Note (Signed)
And leg pain; she declined offer for work-up, referral to ortho for this; she wants to wait for the MRI to come back and then decide what to do

## 2015-05-29 NOTE — Assessment & Plan Note (Signed)
Encouraged modest weight loss 

## 2015-05-29 NOTE — Assessment & Plan Note (Signed)
Refer back to vascular specialist

## 2015-05-29 NOTE — Assessment & Plan Note (Signed)
USPSTF grade A and B recommendations reviewed with patient; age-appropriate recommendations, preventive care, screening tests, etc discussed and encouraged; healthy living encouraged; see AVS for patient education given to patient  

## 2015-05-29 NOTE — Assessment & Plan Note (Signed)
SBE taught and encouraged; CBE done today; yearly mammograms (or patient may opt for mammo every two years)

## 2015-06-09 ENCOUNTER — Other Ambulatory Visit: Payer: Self-pay | Admitting: Family Medicine

## 2015-06-09 DIAGNOSIS — M25551 Pain in right hip: Secondary | ICD-10-CM

## 2015-06-09 DIAGNOSIS — Z5181 Encounter for therapeutic drug level monitoring: Secondary | ICD-10-CM

## 2015-06-09 DIAGNOSIS — E785 Hyperlipidemia, unspecified: Secondary | ICD-10-CM

## 2015-06-09 DIAGNOSIS — M542 Cervicalgia: Secondary | ICD-10-CM

## 2015-06-09 NOTE — Telephone Encounter (Signed)
Pt came in stated she needs refill on Lisinopril, Atorvastatin,Naproxen, Buspirone. Pharm is [facility]. Thanks.

## 2015-06-09 NOTE — Telephone Encounter (Signed)
Routing to provider. Patient does not need rx for Atorvastatin, it was refilled on 05/03/15 for 90 with 1 refill.

## 2015-06-10 ENCOUNTER — Ambulatory Visit: Payer: Medicare Other

## 2015-06-10 MED ORDER — BUSPIRONE HCL 5 MG PO TABS: 2.5000 mg | ORAL_TABLET | Freq: Three times a day (TID) | ORAL | Status: DC | PRN

## 2015-06-10 MED ORDER — NAPROXEN 375 MG PO TABS: 375.0000 mg | ORAL_TABLET | Freq: Two times a day (BID) | ORAL | Status: DC

## 2015-06-10 NOTE — Telephone Encounter (Signed)
Patient notified

## 2015-06-10 NOTE — Telephone Encounter (Signed)
Please ask the patient to come by this week or next for fasting lipids and liver enzyme check (this should be done about 6 weeks after we started the cholesterol medicine)

## 2015-06-10 NOTE — Assessment & Plan Note (Signed)
Recheck liver function after 6 weeks of statin use

## 2015-06-10 NOTE — Assessment & Plan Note (Signed)
Check lipids after she's bee on statin for 6 weeks

## 2015-06-28 ENCOUNTER — Telehealth: Payer: Self-pay | Admitting: Family Medicine

## 2015-06-28 NOTE — Telephone Encounter (Signed)
I received a note from Burgess Estelle, RN at the cancer center regarding the low dose chest CT As follows: Allison Pham 11-06-2060, only has a 15 pack year history. (insurance requires at least 80). --------------------------- Alwyn Ren, Can you teach me how to cancel this test? Thank you

## 2015-06-30 ENCOUNTER — Ambulatory Visit
Admission: RE | Admit: 2015-06-30 | Discharge: 2015-06-30 | Disposition: A | Discharge status: Home / Self Care | Payer: Medicare Other | Source: Ambulatory Visit | Attending: Family Medicine | Admitting: Family Medicine

## 2015-06-30 DIAGNOSIS — M542 Cervicalgia: Secondary | ICD-10-CM | POA: Diagnosis not present

## 2015-06-30 DIAGNOSIS — M5021 Other cervical disc displacement,  high cervical region: Secondary | ICD-10-CM | POA: Insufficient documentation

## 2015-06-30 DIAGNOSIS — M47812 Spondylosis without myelopathy or radiculopathy, cervical region: Secondary | ICD-10-CM | POA: Diagnosis not present

## 2015-07-08 ENCOUNTER — Telehealth: Payer: Self-pay

## 2015-07-08 DIAGNOSIS — M47812 Spondylosis without myelopathy or radiculopathy, cervical region: Secondary | ICD-10-CM | POA: Insufficient documentation

## 2015-07-08 MED ORDER — GABAPENTIN 100 MG PO CAPS: ORAL_CAPSULE | ORAL | Status: DC

## 2015-07-08 NOTE — Telephone Encounter (Signed)
Discussed MRI; offered PT, pain clinic for consideration of injections, and gabapentin; she'll take me up on the gabapentin, but not PT or pain clinic for now; open invitation in the chart; she may call for that and we'll make referrals when she is ready She is trying to quit smoking; encouragement given

## 2015-07-08 NOTE — Telephone Encounter (Signed)
She would like her MRI results from last week.

## 2015-07-26 ENCOUNTER — Other Ambulatory Visit: Payer: Self-pay

## 2015-07-26 MED ORDER — LISINOPRIL-HYDROCHLOROTHIAZIDE 10-12.5 MG PO TABS: 0.5000 | ORAL_TABLET | Freq: Every day | ORAL | Status: DC

## 2015-07-26 NOTE — Telephone Encounter (Signed)
approved

## 2015-07-26 NOTE — Telephone Encounter (Signed)
Patient would like rx changed to a 90 day supply.

## 2015-07-27 ENCOUNTER — Ambulatory Visit: Payer: Medicare Other | Admitting: Family Medicine

## 2015-08-04 ENCOUNTER — Other Ambulatory Visit: Payer: Self-pay | Admitting: Family Medicine

## 2015-08-04 MED ORDER — GABAPENTIN 300 MG PO CAPS: 300.0000 mg | ORAL_CAPSULE | Freq: Every day | ORAL | Status: DC

## 2015-08-04 NOTE — Telephone Encounter (Signed)
Sending new Rx for 300 mg strength

## 2015-08-05 ENCOUNTER — Other Ambulatory Visit: Payer: Self-pay

## 2015-08-05 DIAGNOSIS — M542 Cervicalgia: Secondary | ICD-10-CM

## 2015-08-05 DIAGNOSIS — M25551 Pain in right hip: Secondary | ICD-10-CM

## 2015-08-05 MED ORDER — NAPROXEN 375 MG PO TABS: 375.0000 mg | ORAL_TABLET | Freq: Two times a day (BID) | ORAL | Status: DC

## 2015-08-05 NOTE — Telephone Encounter (Signed)
Patient requested to get a 90 day supply on her Naproxen. A qty of 180.

## 2015-08-05 NOTE — Telephone Encounter (Signed)
Rx approved

## 2015-09-01 ENCOUNTER — Other Ambulatory Visit: Payer: Self-pay | Admitting: Family Medicine

## 2015-09-01 NOTE — Telephone Encounter (Signed)
rx approved

## 2015-09-14 ENCOUNTER — Telehealth: Payer: Self-pay | Admitting: Family Medicine

## 2015-09-14 NOTE — Telephone Encounter (Signed)
Please let Allison Pham know that we'd like to see patient for a lab appointment here in the office to recheck cholesterol and liver enzymes  Please schedule a visit with lab in the next week Fasting?  Yes please Thank you, Dr. Sanda Klein

## 2015-09-16 ENCOUNTER — Encounter: Payer: Self-pay | Admitting: Family Medicine

## 2015-09-16 NOTE — Telephone Encounter (Signed)
3 vm no answer sending out letter today.

## 2015-10-14 ENCOUNTER — Encounter: Payer: Self-pay | Admitting: Emergency Medicine

## 2015-10-14 ENCOUNTER — Emergency Department
Admission: EM | Admit: 2015-10-14 | Discharge: 2015-10-14 | Disposition: A | Discharge status: Home / Self Care | Payer: Medicare Other | Attending: Emergency Medicine | Admitting: Emergency Medicine

## 2015-10-14 DIAGNOSIS — R21 Rash and other nonspecific skin eruption: Secondary | ICD-10-CM | POA: Diagnosis present

## 2015-10-14 DIAGNOSIS — S30861A Insect bite (nonvenomous) of abdominal wall, initial encounter: Secondary | ICD-10-CM

## 2015-10-14 DIAGNOSIS — Z8679 Personal history of other diseases of the circulatory system: Secondary | ICD-10-CM | POA: Insufficient documentation

## 2015-10-14 DIAGNOSIS — Y999 Unspecified external cause status: Secondary | ICD-10-CM | POA: Diagnosis not present

## 2015-10-14 DIAGNOSIS — Y929 Unspecified place or not applicable: Secondary | ICD-10-CM | POA: Diagnosis not present

## 2015-10-14 DIAGNOSIS — Y939 Activity, unspecified: Secondary | ICD-10-CM | POA: Diagnosis not present

## 2015-10-14 DIAGNOSIS — Z79899 Other long term (current) drug therapy: Secondary | ICD-10-CM | POA: Diagnosis not present

## 2015-10-14 DIAGNOSIS — W57XXXA Bitten or stung by nonvenomous insect and other nonvenomous arthropods, initial encounter: Secondary | ICD-10-CM | POA: Insufficient documentation

## 2015-10-14 DIAGNOSIS — F17210 Nicotine dependence, cigarettes, uncomplicated: Secondary | ICD-10-CM | POA: Diagnosis not present

## 2015-10-14 DIAGNOSIS — I10 Essential (primary) hypertension: Secondary | ICD-10-CM | POA: Diagnosis not present

## 2015-10-14 DIAGNOSIS — E785 Hyperlipidemia, unspecified: Secondary | ICD-10-CM | POA: Insufficient documentation

## 2015-10-14 HISTORY — DX: Pure hypercholesterolemia, unspecified: E78.00

## 2015-10-14 MED ORDER — DOXYCYCLINE HYCLATE 100 MG PO TABS
100.0000 mg | ORAL_TABLET | Freq: Once | ORAL | Status: AC
  Administered 2015-10-14: 100 mg via ORAL
  Filled 2015-10-14: qty 1

## 2015-10-14 MED ORDER — DOXYCYCLINE HYCLATE 100 MG PO TABS: 100.0000 mg | ORAL_TABLET | Freq: Two times a day (BID) | ORAL | Status: AC

## 2015-10-14 NOTE — Discharge Instructions (Signed)
Tick Bite Information Ticks are insects that attach themselves to the skin and draw blood for food. There are various types of ticks. Common types include wood ticks and deer ticks. Most ticks live in shrubs and grassy areas. Ticks can climb onto your body when you make contact with leaves or grass where the tick is waiting. The most common places on the body for ticks to attach themselves are the scalp, neck, armpits, waist, and groin. Most tick bites are harmless, but sometimes ticks carry germs that cause diseases. These germs can be spread to a person during the tick's feeding process. The chance of a disease spreading through a tick bite depends on:   The type of tick.  Time of year.   How long the tick is attached.   Geographic location.  HOW CAN YOU PREVENT TICK BITES? Take these steps to help prevent tick bites when you are outdoors:  Wear protective clothing. Long sleeves and long pants are best.   Wear white clothes so you can see ticks more easily.  Tuck your pant legs into your socks.   If walking on a trail, stay in the middle of the trail to avoid brushing against bushes.  Avoid walking through areas with long grass.  Put insect repellent on all exposed skin and along boot tops, pant legs, and sleeve cuffs.   Check clothing, hair, and skin repeatedly and before going inside.   Brush off any ticks that are not attached.  Take a shower or bath as soon as possible after being outdoors.  WHAT IS THE PROPER WAY TO REMOVE A TICK? Ticks should be removed as soon as possible to help prevent diseases caused by tick bites. 1. If latex gloves are available, put them on before trying to remove a tick.  2. Using fine-point tweezers, grasp the tick as close to the skin as possible. You may also use curved forceps or a tick removal tool. Grasp the tick as close to its head as possible. Avoid grasping the tick on its body. 3. Pull gently with steady upward pressure until  the tick lets go. Do not twist the tick or jerk it suddenly. This may break off the tick's head or mouth parts. 4. Do not squeeze or crush the tick's body. This could force disease-carrying fluids from the tick into your body.  5. After the tick is removed, wash the bite area and your hands with soap and water or other disinfectant such as alcohol. 6. Apply a small amount of antiseptic cream or ointment to the bite site.  7. Wash and disinfect any instruments that were used.  Do not try to remove a tick by applying a hot match, petroleum jelly, or fingernail polish to the tick. These methods do not work and may increase the chances of disease being spread from the tick bite.  WHEN SHOULD YOU SEEK MEDICAL CARE? Contact your health care provider if you are unable to remove a tick from your skin or if a part of the tick breaks off and is stuck in the skin.  After a tick bite, you need to be aware of signs and symptoms that could be related to diseases spread by ticks. Contact your health care provider if you develop any of the following in the days or weeks after the tick bite:  Unexplained fever.  Rash. A circular rash that appears days or weeks after the tick bite may indicate the possibility of Lyme disease. The rash may resemble   a target with a bull's-eye and may occur at a different part of your body than the tick bite.  Redness and swelling in the area of the tick bite.   Tender, swollen lymph glands.   Diarrhea.   Weight loss.   Cough.   Fatigue.   Muscle, joint, or bone pain.   Abdominal pain.   Headache.   Lethargy or a change in your level of consciousness.  Difficulty walking or moving your legs.   Numbness in the legs.   Paralysis.  Shortness of breath.   Confusion.   Repeated vomiting.    This information is not intended to replace advice given to you by your health care provider. Make sure you discuss any questions you have with your health  care provider.   Document Released: 06/09/2000 Document Revised: 07/03/2014 Document Reviewed: 11/20/2012 Elsevier Interactive Patient Education 2016 [employer]  

## 2015-10-14 NOTE — ED Notes (Signed)
During her son's MS assessment, she brought forth to his attention she had a rash on her belly and per MD input is seeking treatment for possible [place] spotted fever.  Pt is presenting AOx4 and in no pain.

## 2015-10-14 NOTE — ED Provider Notes (Signed)
[facility] Emergency Department Provider Note  ____________________________________________  Time seen: 2:00 AM  I have reviewed the triage vital signs and the nursing notes.   HISTORY  Chief Complaint Rash      HPI Allison Pham is a 55 y.o. female Lib[place] Maw with history of recent tick bite approximately 2 days ago that she removed a tick from her abdominal wall with resultant rash to the area. Patient denies any fever no joint pain no headache or dizziness.     Past Medical History  Diagnosis Date  . Hypertension   . Anxiety   . Hypercholesteremia     Patient Active Problem List   Diagnosis Date Noted  . Spondylosis of cervical spine 07/08/2015  . Preventative health care 05/29/2015  . Overweight (BMI 25.0-29.9) 05/29/2015  . Breast cancer screening 05/25/2015  . Varicose vein of leg 05/25/2015  . Lymphocytosis 05/03/2015  . Monocytosis 05/03/2015  . Essential hypertension, benign 04/28/2015  . Tobacco abuse 04/28/2015  . Hip pain 04/28/2015  . Medication monitoring encounter 04/28/2015  . Abnormal weight gain 04/28/2015  . Constipation 04/28/2015  . Dyslipidemia 04/28/2015  . Cervical spine pain 04/23/2015    Past Surgical History  Procedure Laterality Date  . Abdominal hysterectomy      Partial  . Hemorrhoid surgery      Current Outpatient Rx  Name  Route  Sig  Dispense  Refill  . atorvastatin (LIPITOR) 20 MG tablet   Oral   Take 1 tablet (20 mg total) by mouth at bedtime.   90 tablet   3   . busPIRone (BUSPAR) 5 MG tablet      Take 0.5-1 tablets (2.5-5 mg total) by mouth 3 (three) times daily asneeded.   90 tablet   2   . doxycycline (VIBRA-TABS) 100 MG tablet   Oral   Take 1 tablet (100 mg total) by mouth 2 (two) times daily.   20 tablet   0   . gabapentin (NEURONTIN) 300 MG capsule   Oral   Take 1 capsule (300 mg total) by mouth at bedtime.   30 capsule   2   . lisinopril-hydrochlorothiazide  (PRINZIDE,ZESTORETIC) 10-12.5 MG tablet   Oral   Take 0.5 tablets by mouth daily.   45 tablet   1   . naproxen (NAPROSYN) 375 MG tablet   Oral   Take 1 tablet (375 mg total) by mouth 2 (two) times daily with a meal. If needed for neck or hip pain   180 tablet   0     90 day supply is okay     Allergies Penicillins  Family History  Problem Relation Age of Onset  . Cancer Mother     Colon  . Hypertension Mother   . Hypertension Father   . Heart attack Father   . Heart disease Father   . Hypertension Sister   . COPD Sister   . Hypertension Brother   . Heart disease Brother   . Cancer Maternal Aunt   . Cancer Maternal Uncle   . Diabetes Neg Hx   . Stroke Neg Hx     Social History Social History  Substance Use Topics  . Smoking status: Current Some Day Smoker -- 1.00 packs/day    Types: Cigarettes  . Smokeless tobacco: Never Used  . Alcohol Use: No    Review of Systems  Constitutional: Negative for fever. Eyes: Negative for visual changes. ENT: Negative for sore throat. Cardiovascular: Negative for chest pain.  Respiratory: Negative for shortness of breath. Gastrointestinal: Negative for abdominal pain, vomiting and diarrhea. Genitourinary: Negative for dysuria. Musculoskeletal: Negative for back pain. Skin: Positive for rash. Neurological: Negative for headaches, focal weakness or numbness.   10-point ROS otherwise negative.  ____________________________________________   PHYSICAL EXAM:  VITAL SIGNS: ED Triage Vitals  Enc Vitals Group     BP 10/14/15 0233 152/108 mmHg     Pulse Rate 10/14/15 0233 77     Resp 10/14/15 0233 19     Temp 10/14/15 0233 98.3 F (36.8 C)     Temp Source 10/14/15 0233 Oral     SpO2 10/14/15 0233 97 %     Weight --      Height --      Head Cir --      Peak Flow --      Pain Score --      Pain Loc --      Pain Edu? --      Excl. in [place]? --      Constitutional: Alert and oriented. Well appearing and in no  distress. Eyes: Conjunctivae are normal. PERRL. Normal extraocular movements. ENT   Head: Normocephalic and atraumatic.   Nose: No congestion/rhinnorhea.   Mouth/Throat: Mucous membranes are moist.   Neck: No stridor. Hematological/Lymphatic/Immunilogical: No cervical lymphadenopathy. Cardiovascular: Normal rate, regular rhythm. Normal and symmetric distal pulses are present in all extremities. No murmurs, rubs, or gallops. Respiratory: Normal respiratory effort without tachypnea nor retractions. Breath sounds are clear and equal bilaterally. No wheezes/rales/rhonchi. Gastrointestinal: Soft and nontender. No distention. There is no CVA tenderness. Genitourinary: deferred Musculoskeletal: Nontender with normal range of motion in all extremities. No joint effusions.  No lower extremity tenderness nor edema. Neurologic:  Normal speech and language. No gross focal neurologic deficits are appreciated. Speech is normal.  Skin:  Skin is warm, dry and intact. Discreet maculopapular rash noted to the patient's abdomen diffusely. Psychiatric: Mood and affect are normal. Speech and behavior are normal. Patient exhibits appropriate insight and judgment.     INITIAL IMPRESSION / ASSESSMENT AND PLAN / ED COURSE  Pertinent labs & imaging results that were available during my care of the patient were reviewed by me and considered in my medical decision making (see chart for details).  Strip physical exam concerning for possible [facility] spotted fever such patient received doxycycline emergency department will be prescribed same for home  ____________________________________________   FINAL CLINICAL IMPRESSION(S) / ED DIAGNOSES  Final diagnoses:  Tick bite of abdomen, initial encounter  [facility] spotted fever    Gregor Hams, MD 10/14/15 [phone]

## 2015-10-29 ENCOUNTER — Ambulatory Visit
Admission: RE | Admit: 2015-10-29 | Discharge: 2015-10-29 | Disposition: A | Discharge status: Home / Self Care | Payer: Medicare Other | Source: Ambulatory Visit | Attending: Family Medicine | Admitting: Family Medicine

## 2015-10-29 ENCOUNTER — Encounter: Payer: Self-pay | Admitting: Family Medicine

## 2015-10-29 ENCOUNTER — Ambulatory Visit (INDEPENDENT_AMBULATORY_CARE_PROVIDER_SITE_OTHER): Payer: Medicare Other | Admitting: Family Medicine

## 2015-10-29 VITALS — BP 120/80 | HR 95 | Temp 98.7°F | Resp 14 | Wt 147.7 lb

## 2015-10-29 DIAGNOSIS — W57XXXD Bitten or stung by nonvenomous insect and other nonvenomous arthropods, subsequent encounter: Secondary | ICD-10-CM | POA: Diagnosis not present

## 2015-10-29 DIAGNOSIS — M79645 Pain in left finger(s): Secondary | ICD-10-CM | POA: Diagnosis not present

## 2015-10-29 DIAGNOSIS — R209 Unspecified disturbances of skin sensation: Secondary | ICD-10-CM | POA: Diagnosis not present

## 2015-10-29 DIAGNOSIS — L309 Dermatitis, unspecified: Secondary | ICD-10-CM

## 2015-10-29 DIAGNOSIS — R208 Other disturbances of skin sensation: Secondary | ICD-10-CM

## 2015-10-29 DIAGNOSIS — S30861D Insect bite (nonvenomous) of abdominal wall, subsequent encounter: Secondary | ICD-10-CM | POA: Diagnosis not present

## 2015-10-29 DIAGNOSIS — R234 Changes in skin texture: Secondary | ICD-10-CM

## 2015-10-29 DIAGNOSIS — W57XXXA Bitten or stung by nonvenomous insect and other nonvenomous arthropods, initial encounter: Secondary | ICD-10-CM

## 2015-10-29 DIAGNOSIS — S30861A Insect bite (nonvenomous) of abdominal wall, initial encounter: Secondary | ICD-10-CM | POA: Insufficient documentation

## 2015-10-29 MED ORDER — TRIAMCINOLONE ACETONIDE 0.5 % EX OINT: 1.0000 "application " | TOPICAL_OINTMENT | Freq: Two times a day (BID) | CUTANEOUS | Status: DC

## 2015-10-29 NOTE — Assessment & Plan Note (Signed)
Unusual, symmetric; no known cause; will check for scleroderma

## 2015-10-29 NOTE — Progress Notes (Signed)
BP 120/80 mmHg  Pulse 95  Temp(Src) 98.7 F (37.1 C) (Oral)  Resp 14  Wt 147 lb 11.2 oz (66.996 kg)  SpO2 96%   Subjective:    Patient ID: Allison Pham, female    DOB: Oct 07, 1960, 55 y.o.   MRN: [id number]  HPI: Allison Pham is a 55 y.o. female  Chief Complaint  Patient presents with  . Insect Bite    tick bite 2 weeks ago, went to er and was given antibiotic doxy.  Pt still has rash that itches   She was bitten by a tick on April 14th; went to the ER; they gave her doxy, no blood testing She took the doxycycline really made her dizzy; finished out the pills; still has bad rash; red and fine bumps on   Both index fingers have hardening at the pads; she says it hurts real bad; no recollection of injury; no autoimmune disease in the family; legs hurt, but not bad; neck eased up; medicine helped; she thinks that there is something in the tips of her fingers  Depression screen [facility] 2/9 10/29/2015 05/25/2015 04/23/2015  Decreased Interest 0 1 2  Down, Depressed, Hopeless 0 1 0  PHQ - 2 Score 0 2 2  Altered sleeping - - 2  Tired, decreased energy - - 3  Change in appetite - - 0  Feeling bad or failure about yourself  - - 0  Trouble concentrating - - 0  Moving slowly or fidgety/restless - - 0  Suicidal thoughts - - 0  PHQ-9 Score - - 7  Difficult doing work/chores - - Somewhat difficult    GAD 7 : Generalized Anxiety Score 04/23/2015  Nervous, Anxious, on Edge 1  Control/stop worrying 2  Worry too much - different things 2  Trouble relaxing 2  Restless 0  Easily annoyed or irritable 0  Afraid - awful might happen 0  Total GAD 7 Score 7  Anxiety Difficulty Not difficult at all    Relevant past medical, surgical, family and social history reviewed Past Medical History  Diagnosis Date  . Hypertension   . Anxiety   . Hypercholesteremia   . Tick bite    Past Surgical History  Procedure Laterality Date  . Abdominal hysterectomy      Partial  . Hemorrhoid surgery      Family History  Problem Relation Age of Onset  . Cancer Mother     Colon  . Hypertension Mother   . Hypertension Father   . Heart attack Father   . Heart disease Father   . Hypertension Sister   . COPD Sister   . Hypertension Brother   . Heart disease Brother   . Cancer Maternal Aunt   . Cancer Maternal Uncle   . Diabetes Neg Hx   . Stroke Neg Hx    Interim medical history since last visit reviewed. Allergies and medications reviewed  Review of Systems Per HPI unless specifically indicated above     Objective:    BP 120/80 mmHg  Pulse 95  Temp(Src) 98.7 F (37.1 C) (Oral)  Resp 14  Wt 147 lb 11.2 oz (66.996 kg)  SpO2 96%  Wt Readings from Last 3 Encounters:  10/29/15 147 lb 11.2 oz (66.996 kg)  05/25/15 145 lb (65.772 kg)  04/23/15 143 lb 3.2 oz (64.955 kg)    Physical Exam  Constitutional: She appears well-developed and well-nourished.  HENT:  Head: Normocephalic and atraumatic.  Cardiovascular: Normal rate and regular  rhythm.   Pulmonary/Chest: Effort normal and breath sounds normal.  Musculoskeletal:       Right hand: She exhibits deformity (thickening, callus appearance to tip/pad of index finger).       Left hand: She exhibits deformity (thickening, callus appearance to tip/pad of index finger).  Skin: Rash (fine erythematous rash on the abdomen; eschar lateral aspect of rash mid-abdomen; no vesicles; no drainage; not confluent) noted.  Psychiatric: She has a normal mood and affect.      Assessment & Plan:   Problem List Items Addressed This Visit      Musculoskeletal and Integument   Tick bite of abdomen - Primary    Patient desiring labs; will get get bloodwork; she has completed course of doxy; important to use tick repellent      Relevant Orders   Rocky mtn spotted fvr abs pnl(IgG+IgM)   Lyme Disease, IgM, Early Test w/ Rflx   Lyme Aby, Western Blot IgG & IgM w/bands   Dermatitis    Not typical target lesion; will treat with TAC 0.5%  ointment        Other   Skin texture changes    Unusual, symmetric; no known cause; will check for scleroderma      Relevant Orders   Anti-Scleroderma Antibody   ANA w/Reflex if Positive    Other Visit Diagnoses    Sensation of foreign body in finger        xrays to r/o foreign body, also testing for scleroderma    Relevant Orders    DG Finger Index Left    DG Finger Index Right       Follow up plan: No Follow-up on file.  An after-visit summary was printed and given to the patient at [facility].  Please see the patient instructions which may contain other information and recommendations beyond what is mentioned above in the assessment and plan.  Meds ordered this encounter  Medications  . triamcinolone ointment (KENALOG) 0.5 %    Sig: Apply 1 application topically 2 (two) times daily. If needed on rash; too strong for face, under arms, and groin    Dispense:  30 g    Refill:  0   Orders Placed This Encounter  Procedures  . DG Finger Index Left  . DG Finger Index Right  . Rocky mtn spotted fvr abs pnl(IgG+IgM)  . Lyme Disease, IgM, Early Test w/ Rflx  . Lyme Aby, Western Blot IgG & IgM w/bands  . Anti-Scleroderma Antibody  . ANA w/Reflex if Positive

## 2015-10-29 NOTE — Assessment & Plan Note (Signed)
Patient desiring labs; will get get bloodwork; she has completed course of doxy; important to use tick repellent

## 2015-10-29 NOTE — Patient Instructions (Addendum)
Please have labs and xrays done today Use the ointment on the belly if needed Use claritin or allegra or benadryl if needed for itching Tick and mosquito repellent when outdoors Make sure pets are treated for ticks too

## 2015-10-29 NOTE — Assessment & Plan Note (Signed)
Not typical target lesion; will treat with TAC 0.5% ointment

## 2015-11-01 ENCOUNTER — Other Ambulatory Visit: Payer: Self-pay | Admitting: Family Medicine

## 2015-11-01 ENCOUNTER — Telehealth: Payer: Self-pay | Admitting: Family Medicine

## 2015-11-01 ENCOUNTER — Ambulatory Visit
Admission: RE | Admit: 2015-11-01 | Discharge: 2015-11-01 | Disposition: A | Discharge status: Home / Self Care | Payer: Medicare Other | Source: Ambulatory Visit | Attending: Family Medicine | Admitting: Family Medicine

## 2015-11-01 DIAGNOSIS — Z1231 Encounter for screening mammogram for malignant neoplasm of breast: Secondary | ICD-10-CM | POA: Diagnosis not present

## 2015-11-01 DIAGNOSIS — R928 Other abnormal and inconclusive findings on diagnostic imaging of breast: Secondary | ICD-10-CM

## 2015-11-01 NOTE — Telephone Encounter (Signed)
Abnormal mammo; additional images ordered as requested

## 2015-11-02 LAB — ANA W/REFLEX IF POSITIVE: Anti Nuclear Antibody(ANA): NEGATIVE

## 2015-11-02 LAB — LYME AB/WESTERN BLOT REFLEX
LYME DISEASE AB, QUANT, IGM: <0.8 index (ref 0.00–0.79)
Lyme IgG/IgM Ab: <0.91 {ISR} (ref 0.00–0.90)

## 2015-11-02 LAB — ANTI-SCLERODERMA ANTIBODY: Scleroderma SCL-70: <0.2 AI (ref 0.0–0.9)

## 2015-11-02 LAB — ROCKY MTN SPOTTED FVR ABS PNL(IGG+IGM)
RMSF IGG: NEGATIVE
RMSF IgM: 0.58 index (ref 0.00–0.89)

## 2015-11-03 ENCOUNTER — Other Ambulatory Visit: Payer: Self-pay

## 2015-11-03 DIAGNOSIS — S60559A Superficial foreign body of unspecified hand, initial encounter: Secondary | ICD-10-CM

## 2015-11-15 ENCOUNTER — Ambulatory Visit
Admission: RE | Admit: 2015-11-15 | Discharge: 2015-11-15 | Disposition: A | Discharge status: Home / Self Care | Payer: Medicare Other | Source: Ambulatory Visit | Attending: Family Medicine | Admitting: Family Medicine

## 2015-11-15 DIAGNOSIS — N63 Unspecified lump in breast: Secondary | ICD-10-CM | POA: Insufficient documentation

## 2015-11-15 DIAGNOSIS — R928 Other abnormal and inconclusive findings on diagnostic imaging of breast: Secondary | ICD-10-CM | POA: Diagnosis not present

## 2015-11-22 ENCOUNTER — Telehealth: Payer: Self-pay | Admitting: Family Medicine

## 2015-11-22 NOTE — Telephone Encounter (Signed)
Please call pt; she has some overdue labs that are getting ready to expire; fasting cholesterol and liver tests; please ask her to have these done in the next week

## 2015-11-23 ENCOUNTER — Other Ambulatory Visit: Payer: Self-pay

## 2015-11-23 NOTE — Telephone Encounter (Signed)
Pt.notified

## 2015-11-30 ENCOUNTER — Telehealth: Payer: Self-pay | Admitting: Family Medicine

## 2015-11-30 ENCOUNTER — Other Ambulatory Visit: Payer: Self-pay | Admitting: Family Medicine

## 2015-11-30 DIAGNOSIS — Z5181 Encounter for therapeutic drug level monitoring: Secondary | ICD-10-CM | POA: Diagnosis not present

## 2015-11-30 DIAGNOSIS — E785 Hyperlipidemia, unspecified: Secondary | ICD-10-CM | POA: Diagnosis not present

## 2015-11-30 MED ORDER — GABAPENTIN 300 MG PO CAPS
300.0000 mg | ORAL_CAPSULE | Freq: Every day | ORAL | Status: DC
Start: 2015-11-30 — End: 2016-05-23

## 2015-11-30 MED ORDER — BUSPIRONE HCL 5 MG PO TABS: 2.5000 mg | ORAL_TABLET | Freq: Three times a day (TID) | ORAL | Status: DC

## 2015-11-30 MED ORDER — LISINOPRIL-HYDROCHLOROTHIAZIDE 10-12.5 MG PO TABS: 0.5000 | ORAL_TABLET | Freq: Every day | ORAL | Status: DC

## 2015-11-30 NOTE — Telephone Encounter (Signed)
Pt states got it done this am

## 2015-11-30 NOTE — Telephone Encounter (Signed)
I will not be able to send the cholesterol medicine until I see the lab results; need to make sure liver is okay and dose is correct; please ask her to have those done tomorrow if not done already I sent the other refills as requested Thank you

## 2015-11-30 NOTE — Telephone Encounter (Signed)
Requesting refill on atorvastatin 20mg , lisinopril 10-12.5mg , busprone 5mg , and gabapentin. Please send to [facility]. Requesting a 110m supply.

## 2015-12-01 ENCOUNTER — Other Ambulatory Visit: Payer: Self-pay | Admitting: Family Medicine

## 2015-12-01 DIAGNOSIS — E785 Hyperlipidemia, unspecified: Secondary | ICD-10-CM

## 2015-12-01 DIAGNOSIS — Z5181 Encounter for therapeutic drug level monitoring: Secondary | ICD-10-CM

## 2015-12-01 LAB — HEPATIC FUNCTION PANEL
ALBUMIN: 4.3 g/dL (ref 3.5–5.5)
ALT: 25 IU/L (ref 0–32)
AST: 20 IU/L (ref 0–40)
Alkaline Phosphatase: 100 IU/L (ref 39–117)
BILIRUBIN, DIRECT: 0.16 mg/dL (ref 0.00–0.40)
Bilirubin Total: 0.7 mg/dL (ref 0.0–1.2)
TOTAL PROTEIN: 7.1 g/dL (ref 6.0–8.5)

## 2015-12-01 LAB — LIPID PANEL W/O CHOL/HDL RATIO
Cholesterol, Total: 285 mg/dL — ABNORMAL HIGH (ref 100–199)
HDL: 56 mg/dL (ref >39)
LDL CALC: 204 mg/dL — AB (ref 0–99)
Triglycerides: 127 mg/dL (ref 0–149)
VLDL CHOLESTEROL CAL: 25 mg/dL (ref 5–40)

## 2015-12-01 NOTE — Assessment & Plan Note (Signed)
Start atorvastatin 40 mg and recheck labs in 6 weeks

## 2015-12-01 NOTE — Assessment & Plan Note (Signed)
Check sgpt in 6 weeks 

## 2015-12-09 ENCOUNTER — Telehealth: Payer: Self-pay | Admitting: Family Medicine

## 2015-12-09 NOTE — Telephone Encounter (Signed)
Checking on lab results. Please return call

## 2015-12-09 NOTE — Telephone Encounter (Signed)
Result Notes     Notes Recorded by Arnetha Courser, MD on 12/01/2015 at 4:45 PM Please let pt know her labs are back; we really appreciate her having these done; her liver enzymes are normal (great news); her cholesterol, though, is really high; her LDL (the "bad" cholesterol) should be under 130 and hers is 204; I'd like to increase the atorvastatin to 40 mg and will send in that prescription; please recheck lipids (fasting) and recheck one liver test in 6 weeks; of course, decrease saturated fats (bacon, sausage, cheese, hamburgers, steak, hot dogs, bologna, etc.); thank you         Reviewed by List     Lolita Rieger, RMA on 12/08/2015 10:30 AM    Arnetha Courser, MD on 12/01/2015 4:47 PM

## 2015-12-10 NOTE — Telephone Encounter (Signed)
Left voice mail

## 2016-01-26 ENCOUNTER — Ambulatory Visit
Admission: EM | Admit: 2016-01-26 | Discharge: 2016-01-26 | Disposition: A | Discharge status: Home / Self Care | Payer: Medicare Other | Attending: Emergency Medicine | Admitting: Emergency Medicine

## 2016-01-26 ENCOUNTER — Encounter: Payer: Self-pay | Admitting: *Deleted

## 2016-01-26 DIAGNOSIS — H8113 Benign paroxysmal vertigo, bilateral: Secondary | ICD-10-CM | POA: Diagnosis not present

## 2016-01-26 DIAGNOSIS — H6593 Unspecified nonsuppurative otitis media, bilateral: Secondary | ICD-10-CM | POA: Diagnosis not present

## 2016-01-26 MED ORDER — MECLIZINE HCL 25 MG PO TABS: 25.0000 mg | ORAL_TABLET | Freq: Four times a day (QID) | ORAL | 0 refills | Status: AC | PRN

## 2016-01-26 MED ORDER — FLUTICASONE PROPIONATE 50 MCG/ACT NA SUSP: 1.0000 | Freq: Two times a day (BID) | NASAL | 0 refills | Status: DC

## 2016-01-26 NOTE — ED Triage Notes (Signed)
Patient started having symptoms of left ear pain one week ago. Additional symptom of dizziness is also present. No history of chronic ear problems.

## 2016-01-26 NOTE — ED Provider Notes (Signed)
CSN: [id number]     Arrival date & time 01/26/16  1448 History   First MD Initiated Contact with Patient 01/26/16 1523     Chief Complaint  Patient presents with  . Otalgia   (Consider location/radiation/quality/duration/timing/severity/associated sxs/prior Treatment) Single caucasian female symptoms started 1 week ago worsening unable to get appt with PCM.  Denied pool/lake swimming, travel, colds, allergy flare.  Vomiting x 1 last week with dizzyness nausea prior to vomiting.      Past Medical History:  Diagnosis Date  . Anxiety   . Hypercholesteremia   . Hypertension   . Tick bite    Past Surgical History:  Procedure Laterality Date  . ABDOMINAL HYSTERECTOMY     Partial  . HEMORRHOID SURGERY     Family History  Problem Relation Age of Onset  . Cancer Mother     Colon  . Hypertension Mother   . Hypertension Father   . Heart attack Father   . Heart disease Father   . Hypertension Sister   . COPD Sister   . Hypertension Brother   . Heart disease Brother   . Cancer Maternal Aunt   . Cancer Maternal Uncle   . Diabetes Neg Hx   . Stroke Neg Hx    Social History  Substance Use Topics  . Smoking status: Current Some Day Smoker    Packs/day: 1.00    Types: Cigarettes  . Smokeless tobacco: Never Used  . Alcohol use No   OB History    No data available     Review of Systems  Constitutional: Negative for activity change, appetite change, chills, diaphoresis, fatigue, fever and unexpected weight change.  HENT: Positive for congestion and ear pain. Negative for dental problem, drooling, ear discharge, facial swelling, hearing loss, mouth sores, nosebleeds, postnasal drip, rhinorrhea, sinus pressure, sneezing, sore throat, tinnitus, trouble swallowing and voice change.   Eyes: Negative for photophobia, pain, discharge, redness, itching and visual disturbance.  Respiratory: Negative for cough, choking, chest tightness, shortness of breath, wheezing and stridor.    Cardiovascular: Negative for chest pain, palpitations and leg swelling.  Gastrointestinal: Positive for nausea and vomiting. Negative for abdominal distention, abdominal pain, blood in stool, constipation and diarrhea.  Endocrine: Negative for cold intolerance and heat intolerance.  Genitourinary: Negative for difficulty urinating, dysuria and hematuria.  Musculoskeletal: Negative for arthralgias, back pain, gait problem, joint swelling, myalgias, neck pain and neck stiffness.  Skin: Negative for color change, pallor, rash and wound.  Allergic/Immunologic: Positive for environmental allergies. Negative for food allergies.  Neurological: Positive for dizziness. Negative for tremors, seizures, syncope, facial asymmetry, speech difficulty, weakness, light-headedness, numbness and headaches.  Hematological: Negative for adenopathy. Does not bruise/bleed easily.  Psychiatric/Behavioral: Negative for agitation, behavioral problems, confusion and sleep disturbance.    Allergies  Penicillins  Home Medications   Prior to Admission medications   Medication Sig Start Date End Date Taking? Authorizing Provider  busPIRone (BUSPAR) 5 MG tablet Take 0.5-1 tablets (2.5-5 mg total) by mouth 3 (three) times daily. 11/30/15  Yes Arnetha Courser, MD  gabapentin (NEURONTIN) 300 MG capsule Take 1 capsule (300 mg total) by mouth at bedtime. 11/30/15  Yes Arnetha Courser, MD  lisinopril-hydrochlorothiazide (PRINZIDE,ZESTORETIC) 10-12.5 MG tablet Take 0.5 tablets by mouth daily. 11/30/15  Yes Arnetha Courser, MD  naproxen (NAPROSYN) 375 MG tablet Take 1 tablet (375 mg total) by mouth 2 (two) times daily with a meal. If needed for neck or hip pain 11/01/15  Yes Melinda P  Lada, MD  fluticasone (FLONASE) 50 MCG/ACT nasal spray Place 1 spray into both nostrils 2 (two) times daily. 01/26/16   Olen Cordial, NP  meclizine (ANTIVERT) 25 MG tablet Take 1 tablet (25 mg total) by mouth 4 (four) times daily as needed for dizziness.  01/26/16 02/26/16  Olen Cordial, NP   Meds Ordered and Administered this Visit  Medications - No data to display  BP 117/76 (BP Location: Left Arm)   Pulse 79   Temp 97.7 F (36.5 C) (Oral)   Resp 18   Ht 5\' 2"  (1.575 m)   Wt 160 lb (72.6 kg)   SpO2 99%   BMI 29.26 kg/m  No data found.   Physical Exam  Constitutional: She is oriented to person, place, and time. She appears well-developed and well-nourished. She is active and cooperative.  Non-toxic appearance. She does not have a sickly appearance. She does not appear ill. No distress.  HENT:  Head: Normocephalic and atraumatic.  Right Ear: Hearing, external ear and ear canal normal. A middle ear effusion is present.  Left Ear: Hearing, external ear and ear canal normal. A middle ear effusion is present.  Nose: Mucosal edema and rhinorrhea present. No nose lacerations, sinus tenderness, nasal deformity, septal deviation or nasal septal hematoma. No epistaxis.  No foreign bodies. Right sinus exhibits no maxillary sinus tenderness and no frontal sinus tenderness. Left sinus exhibits no maxillary sinus tenderness and no frontal sinus tenderness.  Mouth/Throat: Uvula is midline and mucous membranes are normal. Mucous membranes are not pale, not dry and not cyanotic. She does not have dentures. No oral lesions. No trismus in the jaw. Normal dentition. No dental abscesses, uvula swelling, lacerations or dental caries. Posterior oropharyngeal edema and posterior oropharyngeal erythema present. No oropharyngeal exudate or tonsillar abscesses.  Left greater than right air fluid level with opacity; bilateral nasal turbinates edema/erythema with yellow discharge; bilateral allergic shiners; cobblestoning posterior pharynx  Eyes: Conjunctivae, EOM and lids are normal. Pupils are equal, round, and reactive to light. Right eye exhibits no chemosis, no discharge, no exudate and no hordeolum. No foreign body present in the right eye. Left eye exhibits no  chemosis, no discharge, no exudate and no hordeolum. No foreign body present in the left eye. Right conjunctiva is not injected. Right conjunctiva has no hemorrhage. Left conjunctiva is not injected. Left conjunctiva has no hemorrhage. No scleral icterus. Right eye exhibits normal extraocular motion and no nystagmus. Left eye exhibits normal extraocular motion and no nystagmus. Right pupil is round and reactive. Left pupil is round and reactive. Pupils are equal.  Neck: Trachea normal and normal range of motion. Neck supple. No tracheal tenderness, no spinous process tenderness and no muscular tenderness present. No neck rigidity. No tracheal deviation, no edema, no erythema and normal range of motion present. No thyroid mass and no thyromegaly present.  Cardiovascular: Normal rate, regular rhythm, S1 normal, S2 normal, normal heart sounds and intact distal pulses.  PMI is not displaced.  Exam reveals no gallop and no friction rub.   No murmur heard. Pulses:      Radial pulses are 2+ on the right side, and 2+ on the left side.  Pulmonary/Chest: Effort normal and breath sounds normal. No accessory muscle usage or stridor. No respiratory distress. She has no decreased breath sounds. She has no wheezes. She has no rhonchi. She has no rales. She exhibits no tenderness.  Abdominal: Soft. She exhibits no distension.  Musculoskeletal: Normal range of motion. She  exhibits no edema or tenderness.       Right shoulder: Normal.       Left shoulder: Normal.       Right hip: Normal.       Left hip: Normal.       Right knee: Normal.       Left knee: Normal.       Cervical back: Normal.       Right hand: Normal.       Left hand: Normal.  Lymphadenopathy:       Head (right side): No submental, no submandibular, no tonsillar, no preauricular, no posterior auricular and no occipital adenopathy present.       Head (left side): No submental, no submandibular, no tonsillar, no preauricular, no posterior auricular and  no occipital adenopathy present.    She has no cervical adenopathy.       Right cervical: No superficial cervical, no deep cervical and no posterior cervical adenopathy present.      Left cervical: No superficial cervical, no deep cervical and no posterior cervical adenopathy present.  Neurological: She is alert and oriented to person, place, and time. She has normal strength. She is not disoriented. She displays no atrophy and no tremor. No cranial nerve deficit or sensory deficit. She exhibits normal muscle tone. She displays no seizure activity. Coordination and gait normal. GCS eye subscore is 4. GCS verbal subscore is 5. GCS motor subscore is 6.  Skin: Skin is warm, dry and intact. No abrasion, no bruising, no burn, no ecchymosis, no laceration, no lesion, no petechiae and no rash noted. She is not diaphoretic. No cyanosis or erythema. No pallor. Nails show no clubbing.  Psychiatric: She has a normal mood and affect. Her speech is normal and behavior is normal. Judgment and thought content normal. Cognition and memory are normal.  Nursing note and vitals reviewed.   Urgent Care Course   Clinical Course    Procedures (including critical care time)  Labs Review Labs Reviewed - No data to display  Imaging Review No results found.     MDM   1. Otitis media with effusion, bilateral   2. Vertigo, benign paroxysmal, bilateral    Supportive treatment.   No evidence of invasive bacterial infection, non toxic and well hydrated.  This is most likely self limiting viral infection.  I do not see where any further testing or imaging is necessary at this time.   I will suggest supportive care, rest, good hygiene and encourage the patient to take adequate fluids.  The patient is to return to clinic or EMERGENCY ROOM if symptoms worsen or change significantly e.g. ear pain, fever, purulent discharge from ears or bleeding.  Exitcare handout on otitis media with effusion given to patient.  If  drainage from ear(s), fever greater than 100.21F over the next week patient to contact me and I would prescribe cefdinir 300mg  po BID x 10 days #20 RF0  Patient verbalized agreement and understanding of treatment plan.    Discussed with patient otitis media with effusion probably causing vertigo but could also be age.  Meclizine has been taken previously 3 years ago with good results.  Rx given to patient for 25mg  po QID prn.  Avoid driving and alcohol as neurontin and meclizine can cause drowsiness.  Patient did not want work note.  Discussed avoid driving until vertigo resolved as sudden head movements could cause vertigo and unable to control vehicle.   Supportive treatment may take up to  4 doses meclizine per day max 100mg  per 24 hours.   Follow up if aphasia, dysphasia, visual changes, weakness, fall, worst headache of life, incoordination, fever, ear discharge.  Consider ENT evaluation/follow up with PCM if worsening symptoms not controlled with meclizine or needs Rx refill.  Patient verbalized understanding of information/agreed with plan of care and had no further questions at this time.  Patient may use normal saline nasal spray as needed.  Consider antihistamine (meclizine) or nasal steroid use (flonase 1 spray each nostril BID)  Nasal saline 2 sprays each nostril q2h prn congestion.  Avoid triggers if possible.  Shower prior to bedtime if exposed to triggers.  If allergic dust/dust mites recommend mattress/pillow covers/encasements; washing linens, vacuuming, sweeping, dusting weekly.  Call or return to clinic as needed if these symptoms worsen or fail to improve as anticipated.   Exitcare handout on allergic rhinitis given to patient.  Patient verbalized understanding of instructions, agreed with plan of care and had no further questions at this time.  P2:  Avoidance and hand washing.   Olen Cordial, NP 01/26/16 1547

## 2016-01-27 ENCOUNTER — Other Ambulatory Visit: Payer: Self-pay | Admitting: Family Medicine

## 2016-02-13 DIAGNOSIS — L539 Erythematous condition, unspecified: Secondary | ICD-10-CM | POA: Diagnosis not present

## 2016-02-13 DIAGNOSIS — I10 Essential (primary) hypertension: Secondary | ICD-10-CM | POA: Diagnosis not present

## 2016-02-13 DIAGNOSIS — T63444A Toxic effect of venom of bees, undetermined, initial encounter: Secondary | ICD-10-CM | POA: Diagnosis not present

## 2016-02-13 DIAGNOSIS — M7989 Other specified soft tissue disorders: Secondary | ICD-10-CM | POA: Diagnosis not present

## 2016-02-13 DIAGNOSIS — T63441A Toxic effect of venom of bees, accidental (unintentional), initial encounter: Secondary | ICD-10-CM | POA: Diagnosis not present

## 2016-03-07 DIAGNOSIS — L281 Prurigo nodularis: Secondary | ICD-10-CM | POA: Diagnosis not present

## 2016-03-07 DIAGNOSIS — L720 Epidermal cyst: Secondary | ICD-10-CM | POA: Diagnosis not present

## 2016-03-20 ENCOUNTER — Encounter: Payer: Self-pay | Admitting: Family Medicine

## 2016-04-24 ENCOUNTER — Other Ambulatory Visit: Payer: Self-pay | Admitting: Family Medicine

## 2016-04-24 NOTE — Telephone Encounter (Signed)
Patient notified

## 2016-04-24 NOTE — Telephone Encounter (Signed)
Please remind patient to have labs done; thank you

## 2016-04-28 ENCOUNTER — Telehealth: Payer: Self-pay | Admitting: Family Medicine

## 2016-04-28 DIAGNOSIS — R928 Other abnormal and inconclusive findings on diagnostic imaging of breast: Secondary | ICD-10-CM

## 2016-04-28 NOTE — Telephone Encounter (Signed)
6 month breast imaging ordered

## 2016-04-28 NOTE — Assessment & Plan Note (Signed)
Additional images due Nov 2017

## 2016-05-02 ENCOUNTER — Telehealth: Payer: Self-pay | Admitting: Family Medicine

## 2016-05-02 NOTE — Telephone Encounter (Signed)
Patient is requesting a return call from Dr Sanda Klein, she does not wish to speak with the nurse. Patient got test results back from a different dr office and really need to speak to you about this matter.

## 2016-05-02 NOTE — Telephone Encounter (Signed)
Please have the patient STOP the atorvastatin for now until she comes in for her appointment We need to get labs to evaluate her liver and this is cleared by the liver

## 2016-05-02 NOTE — Telephone Encounter (Signed)
Life insurance company says something is wrong with her liver; I explained that I have not seen her for over 6 months and really would like to see her for this to discuss, feel her liver, talk with her, and order labs; she understood

## 2016-05-03 NOTE — Telephone Encounter (Signed)
Patient notified

## 2016-05-04 ENCOUNTER — Encounter: Payer: Self-pay | Admitting: Family Medicine

## 2016-05-15 ENCOUNTER — Ambulatory Visit (INDEPENDENT_AMBULATORY_CARE_PROVIDER_SITE_OTHER): Payer: Medicare Other | Admitting: Family Medicine

## 2016-05-15 ENCOUNTER — Encounter: Payer: Self-pay | Admitting: Family Medicine

## 2016-05-15 VITALS — BP 116/64 | HR 78 | Temp 98.3°F | Resp 14 | Wt 152.0 lb

## 2016-05-15 DIAGNOSIS — R748 Abnormal levels of other serum enzymes: Secondary | ICD-10-CM

## 2016-05-15 DIAGNOSIS — K5909 Other constipation: Secondary | ICD-10-CM

## 2016-05-15 DIAGNOSIS — Z5181 Encounter for therapeutic drug level monitoring: Secondary | ICD-10-CM | POA: Diagnosis not present

## 2016-05-15 DIAGNOSIS — M47812 Spondylosis without myelopathy or radiculopathy, cervical region: Secondary | ICD-10-CM | POA: Diagnosis not present

## 2016-05-15 DIAGNOSIS — I10 Essential (primary) hypertension: Secondary | ICD-10-CM | POA: Diagnosis not present

## 2016-05-15 DIAGNOSIS — Z87891 Personal history of nicotine dependence: Secondary | ICD-10-CM | POA: Insufficient documentation

## 2016-05-15 DIAGNOSIS — D72820 Lymphocytosis (symptomatic): Secondary | ICD-10-CM | POA: Diagnosis not present

## 2016-05-15 DIAGNOSIS — E785 Hyperlipidemia, unspecified: Secondary | ICD-10-CM | POA: Diagnosis not present

## 2016-05-15 LAB — CBC WITH DIFFERENTIAL/PLATELET
BASOS ABS: 53 {cells}/uL (ref 0–200)
Basophils Relative: 1 %
EOS ABS: 265 {cells}/uL (ref 15–500)
EOS PCT: 5 %
HCT: 42.7 % (ref 35.0–45.0)
HEMOGLOBIN: 14.6 g/dL (ref 11.7–15.5)
LYMPHS ABS: 2332 {cells}/uL (ref 850–3900)
Lymphocytes Relative: 44 %
MCH: 28.7 pg (ref 27.0–33.0)
MCHC: 34.2 g/dL (ref 32.0–36.0)
MCV: 84.1 fL (ref 80.0–100.0)
MONO ABS: 530 {cells}/uL (ref 200–950)
MPV: 9.9 fL (ref 7.5–12.5)
Monocytes Relative: 10 %
NEUTROS ABS: 2120 {cells}/uL (ref 1500–7800)
Neutrophils Relative %: 40 %
Platelets: 223 10*3/uL (ref 140–400)
RBC: 5.08 MIL/uL (ref 3.80–5.10)
RDW: 13.9 % (ref 11.0–15.0)
WBC: 5.3 10*3/uL (ref 3.8–10.8)

## 2016-05-15 LAB — COMPLETE METABOLIC PANEL WITH GFR
ALT: 36 U/L — ABNORMAL HIGH (ref 6–29)
AST: 25 U/L (ref 10–35)
Albumin: 4.6 g/dL (ref 3.6–5.1)
Alkaline Phosphatase: 100 U/L (ref 33–130)
BUN: 19 mg/dL (ref 7–25)
CO2: 30 mmol/L (ref 20–31)
Calcium: 9.9 mg/dL (ref 8.6–10.4)
Chloride: 101 mmol/L (ref 98–110)
Creat: 0.8 mg/dL (ref 0.50–1.05)
GFR, EST NON AFRICAN AMERICAN: 83 mL/min (ref >=60)
GFR, Est African American: >89 mL/min (ref >=60)
GLUCOSE: 96 mg/dL (ref 65–99)
POTASSIUM: 4.2 mmol/L (ref 3.5–5.3)
SODIUM: 139 mmol/L (ref 135–146)
Total Bilirubin: 0.8 mg/dL (ref 0.2–1.2)
Total Protein: 7.4 g/dL (ref 6.1–8.1)

## 2016-05-15 LAB — LIPID PANEL
CHOL/HDL RATIO: 4.8 ratio (ref <5.0)
CHOLESTEROL: 260 mg/dL — AB (ref <200)
HDL: 54 mg/dL (ref >50)
LDL Cholesterol: 177 mg/dL — ABNORMAL HIGH (ref <100)
Triglycerides: 147 mg/dL (ref <150)
VLDL: 29 mg/dL (ref <30)

## 2016-05-15 LAB — GAMMA GT: GGT: 205 U/L — AB (ref 7–51)

## 2016-05-15 LAB — TSH: TSH: 1.1 mIU/L

## 2016-05-15 MED ORDER — LISINOPRIL 5 MG PO TABS: 5.0000 mg | ORAL_TABLET | Freq: Every day | ORAL | 1 refills | Status: DC

## 2016-05-15 NOTE — Assessment & Plan Note (Addendum)
Excellent; check liver enzymes; stopping the HCTZ component since she reports dizziness at times; recheck BP in 1-2 weeks

## 2016-05-15 NOTE — Assessment & Plan Note (Signed)
Patient wishes to have cotinine checked; quit smoking 8 months ago

## 2016-05-15 NOTE — Assessment & Plan Note (Signed)
Check fasting labs 

## 2016-05-15 NOTE — Assessment & Plan Note (Signed)
Check CBC 

## 2016-05-15 NOTE — Assessment & Plan Note (Signed)
Last level was elevated in Sept; recheck today

## 2016-05-15 NOTE — Assessment & Plan Note (Signed)
Check electrolytes and creatinine on the BP medicine

## 2016-05-15 NOTE — Patient Instructions (Addendum)
Let's get labs today We'll have you see the cervical spine doctor  If you have not heard anything from my staff in a week about any orders/referrals/studies from today, please contact us here to follow-up [phone] Return in 1-2 weeks for other issues such as constipation, menopause, ear

## 2016-05-15 NOTE — Assessment & Plan Note (Signed)
Check level today along with alk phos isoenzymes

## 2016-05-15 NOTE — Progress Notes (Signed)
BP 116/64   Pulse 78   Temp 98.3 F (36.8 C) (Oral)   Resp 14   Wt 152 lb (68.9 kg)   SpO2 96%   BMI 27.80 kg/m    Subjective:    Patient ID: Allison Pham, female    DOB: 1960-10-13, 55 y.o.   MRN: [id number]  HPI: Allison Pham is a 55 y.o. female  Chief Complaint  Patient presents with  . Paperwork    life insurance  . Neck Pain    referral  . Constipation  . Ear Pain    right   She had some labs done through health insurance company, in Sept; elevated alk phos and GGT Reviewed together in lab section, scanned already No abdominal pain; no nausea, no jaundice Appetite is good No pruritus No blood in the stool, no blood in the urine Stools are normal brown, not tan colored Stools do not have unusual smell; is constipated She has neck pain, that's real bad, otherwise does not feel like her bones ache deeply; some some knee arthritis Fam hx of liver problems: none known Nephew might have had diabetes, but that's the only one No fam hx of Mckeough's disease or need for chelation therapy No known fam hx of celiac disease Mother died from colon cancer She uses naproxen, but does not use any tylenol No exposures to chemicals Drinks alcohol if she goes to hear a band; not something she has to have; never drank heavily previously in her life; no drugs GGT 150, alk phos 150 No hx of liver imaging Has been on gabapentin; she does not take it often, just once in a while; she doesn't want anything that will trigger her; does not like drugs  She went to have her skin on her finger checked out; the derm couldn't get the xrays from here; she used some cream which had a steroid in it, and patient wonders if that affected her blood work; none in the last two months  Depression screen [facility] 2/9 05/15/2016 10/29/2015 05/25/2015 04/23/2015  Decreased Interest 0 0 1 2  Down, Depressed, Hopeless 1 0 1 0  PHQ - 2 Score 1 0 2 2  Altered sleeping - - - 2  Tired, decreased energy - - - 3   Change in appetite - - - 0  Feeling bad or failure about yourself  - - - 0  Trouble concentrating - - - 0  Moving slowly or fidgety/restless - - - 0  Suicidal thoughts - - - 0  PHQ-9 Score - - - 7  Difficult doing work/chores - - - Somewhat difficult   Relevant past medical, surgical, family and social history reviewed Past Medical History:  Diagnosis Date  . Anxiety   . Hypercholesteremia   . Hypertension   . Tick bite    Past Surgical History:  Procedure Laterality Date  . ABDOMINAL HYSTERECTOMY     Partial  . HEMORRHOID SURGERY     Family History  Problem Relation Age of Onset  . Cancer Mother     Colon  . Hypertension Mother   . Hypertension Father   . Heart attack Father   . Heart disease Father   . Hypertension Sister   . COPD Sister   . Hypertension Brother   . Heart disease Brother   . Cancer Maternal Aunt   . Cancer Maternal Uncle   . Diabetes Neg Hx   . Stroke Neg Hx    Social History  Substance Use Topics  . Smoking status: Former Smoker    Packs/day: 1.00    Types: Cigarettes    Quit date: 09/13/2015  . Smokeless tobacco: Never Used  . Alcohol use No   Interim medical history since last visit reviewed. Allergies and medications reviewed  Review of Systems Per HPI unless specifically indicated above     Objective:    BP 116/64   Pulse 78   Temp 98.3 F (36.8 C) (Oral)   Resp 14   Wt 152 lb (68.9 kg)   SpO2 96%   BMI 27.80 kg/m   Wt Readings from Last 3 Encounters:  05/15/16 152 lb (68.9 kg)  01/26/16 160 lb (72.6 kg)  10/29/15 147 lb 11.2 oz (67 kg)    Physical Exam  Constitutional: She appears well-developed and well-nourished. No distress.  HENT:  Head: Normocephalic and atraumatic.  Eyes: EOM are normal. No scleral icterus.  Neck: No thyromegaly present.  Cardiovascular: Normal rate, regular rhythm and normal heart sounds.   No murmur heard. Pulmonary/Chest: Effort normal and breath sounds normal. No respiratory distress.  She has no wheezes.  Abdominal: Soft. Bowel sounds are normal. She exhibits no distension and no mass. There is no tenderness. There is no guarding.  Musculoskeletal: Normal range of motion. She exhibits no edema.  Neurological: She is alert.  Skin: Skin is warm and dry. She is not diaphoretic. No pallor.  Psychiatric: She has a normal mood and affect. Her behavior is normal. Judgment and thought content normal.   Results for orders placed or performed in visit on 11/30/15  Lipid Panel w/o Chol/HDL Ratio  Result Value Ref Range   Cholesterol, Total 285 (H) 100 - 199 mg/dL   Triglycerides 127 0 - 149 mg/dL   HDL 56 >39 mg/dL   VLDL Cholesterol Cal 25 5 - 40 mg/dL   LDL Calculated 204 (H) 0 - 99 mg/dL   Comment: Comment   Hepatic function panel  Result Value Ref Range   Total Protein 7.1 6.0 - 8.5 g/dL   Albumin 4.3 3.5 - 5.5 g/dL   Bilirubin Total 0.7 0.0 - 1.2 mg/dL   Bilirubin, Direct 0.16 0.00 - 0.40 mg/dL   Alkaline Phosphatase 100 39 - 117 IU/L   AST 20 0 - 40 IU/L   ALT 25 0 - 32 IU/L      Assessment & Plan:   Problem List Items Addressed This Visit      Cardiovascular and Mediastinum   Essential hypertension, benign    Excellent; check liver enzymes; stopping the HCTZ component since she reports dizziness at times; recheck BP in 1-2 weeks      Relevant Medications   lisinopril (PRINIVIL,ZESTRIL) 5 MG tablet     Digestive   Constipation    Check TSH; further discussion at f/u      Relevant Orders   TSH     Musculoskeletal and Integument   Spondylosis of cervical spine    Refer to spine surgeon at patient's request      Relevant Orders   Ambulatory referral to Orthopedic Surgery     Other   Smoking history    Patient wishes to have cotinine checked; quit smoking 8 months ago      Medication monitoring encounter    Check electrolytes and creatinine on the BP medicine      Lymphocytosis    Check CBC      Relevant Orders   CBC with  Differential/Platelet   Elevated   serum GGT level    Last level was elevated in Sept; recheck today      Relevant Orders   Gamma GT   Elevated serum alkaline phosphatase level - Primary    Check level today along with alk phos isoenzymes      Relevant Orders   COMPLETE METABOLIC PANEL WITH GFR   Alkaline Phosphatase Isoenzymes   Dyslipidemia    Check fasting labs      Relevant Orders   Lipid panel       Follow up plan: Return 1-2 weeks, for another visit.  An after-visit summary was printed and given to the patient at check-out.  Please see the patient instructions which may contain other information and recommendations beyond what is mentioned above in the assessment and plan.  Meds ordered this encounter  Medications  . lisinopril (PRINIVIL,ZESTRIL) 5 MG tablet    Sig: Take 1 tablet (5 mg total) by mouth daily. For blood pressure    Dispense:  90 tablet    Refill:  1    STOP the lisinopril-HCTZ, just going to plain ACE-I now    Orders Placed This Encounter  Procedures  . Gamma GT  . COMPLETE METABOLIC PANEL WITH GFR  . CBC with Differential/Platelet  . Lipid panel  . Alkaline Phosphatase Isoenzymes  . TSH  . Ambulatory referral to Orthopedic Surgery   Return for ear and constipation and menopausea   

## 2016-05-15 NOTE — Assessment & Plan Note (Addendum)
Refer to spine surgeon at patient's request

## 2016-05-15 NOTE — Assessment & Plan Note (Addendum)
Check TSH; further discussion at f/u

## 2016-05-16 ENCOUNTER — Other Ambulatory Visit: Payer: Self-pay | Admitting: Family Medicine

## 2016-05-16 DIAGNOSIS — R748 Abnormal levels of other serum enzymes: Secondary | ICD-10-CM

## 2016-05-16 NOTE — Progress Notes (Signed)
Order liver US 

## 2016-05-16 NOTE — Assessment & Plan Note (Signed)
Check liver US

## 2016-05-22 ENCOUNTER — Other Ambulatory Visit: Payer: Self-pay | Admitting: Family Medicine

## 2016-05-22 ENCOUNTER — Telehealth: Payer: Self-pay | Admitting: Family Medicine

## 2016-05-22 LAB — ALKALINE PHOSPHATASE ISOENZYMES
Alkaline Phonsphatase: 113 U/L (ref 33–130)
Bone Isoenzymes: 12 % — ABNORMAL LOW (ref 28–66)
INTESTINAL ISOENZYMES (ALP ISO): 1 % (ref 1–24)
Liver Isoenzymes: 66 % (ref 25–69)
Macrohepatic isoenzymes: 21 % — ABNORMAL HIGH

## 2016-05-22 NOTE — Telephone Encounter (Signed)
Pt said that no one has called her about her lab results. PLease call.

## 2016-05-22 NOTE — Telephone Encounter (Signed)
Left detailed voicemail of labs

## 2016-05-23 ENCOUNTER — Other Ambulatory Visit: Payer: Self-pay | Admitting: Family Medicine

## 2016-05-23 ENCOUNTER — Other Ambulatory Visit: Payer: Self-pay

## 2016-05-23 ENCOUNTER — Ambulatory Visit
Admission: RE | Admit: 2016-05-23 | Discharge: 2016-05-23 | Disposition: A | Discharge status: Home / Self Care | Payer: Medicare Other | Source: Ambulatory Visit | Attending: Family Medicine | Admitting: Family Medicine

## 2016-05-23 DIAGNOSIS — R748 Abnormal levels of other serum enzymes: Secondary | ICD-10-CM | POA: Diagnosis not present

## 2016-05-23 DIAGNOSIS — R7989 Other specified abnormal findings of blood chemistry: Secondary | ICD-10-CM | POA: Diagnosis not present

## 2016-05-24 NOTE — Telephone Encounter (Signed)
Gabapentin cleared [place]ally; rx approved

## 2016-05-25 ENCOUNTER — Ambulatory Visit (INDEPENDENT_AMBULATORY_CARE_PROVIDER_SITE_OTHER): Payer: Medicare Other | Admitting: Family Medicine

## 2016-05-25 ENCOUNTER — Encounter: Payer: Self-pay | Admitting: Family Medicine

## 2016-05-25 VITALS — BP 130/78 | HR 82 | Temp 98.1°F | Resp 14 | Ht 60.5 in | Wt 152.6 lb

## 2016-05-25 DIAGNOSIS — R748 Abnormal levels of other serum enzymes: Secondary | ICD-10-CM | POA: Diagnosis not present

## 2016-05-25 DIAGNOSIS — R740 Nonspecific elevation of levels of transaminase and lactic acid dehydrogenase [LDH]: Secondary | ICD-10-CM | POA: Diagnosis not present

## 2016-05-25 DIAGNOSIS — Z0000 Encounter for general adult medical examination without abnormal findings: Secondary | ICD-10-CM | POA: Diagnosis not present

## 2016-05-25 DIAGNOSIS — R102 Pelvic and perineal pain: Secondary | ICD-10-CM | POA: Diagnosis not present

## 2016-05-25 DIAGNOSIS — R109 Unspecified abdominal pain: Secondary | ICD-10-CM | POA: Diagnosis not present

## 2016-05-25 NOTE — Telephone Encounter (Signed)
Duplicate - already sent.

## 2016-05-25 NOTE — Patient Instructions (Addendum)
We'll get labs today We'll get US of the pelvis We'll have you see the gastroenterologist about your liver If you have not heard anything from my staff in a week about any orders/referrals/studies from today, please contact us here to follow-up [phone] Try to limit saturated fats in your diet (bologna, hot dogs, barbeque, cheeseburgers, hamburgers, steak, bacon, sausage, cheese, etc.) and get more fresh fruits, vegetables, and whole grains Call for your mammogram studies Health Maintenance, Female Introduction Adopting a healthy lifestyle and getting preventive care can go a long way to promote health and wellness. Talk with your health care provider about what schedule of regular examinations is right for you. This is a good chance for you to check in with your provider about disease prevention and staying healthy. In between checkups, there are plenty of things you can do on your own. Experts have done a lot of research about which lifestyle changes and preventive measures are most likely to keep you healthy. Ask your health care provider for more information. Weight and diet Eat a healthy diet  Be sure to include plenty of vegetables, fruits, low-fat dairy products, and lean protein.  Do not eat a lot of foods high in solid fats, added sugars, or salt.  Get regular exercise. This is one of the most important things you can do for your health.  Most adults should exercise for at least 150 minutes each week. The exercise should increase your heart rate and make you sweat (moderate-intensity exercise).  Most adults should also do strengthening exercises at least twice a week. This is in addition to the moderate-intensity exercise. Maintain a healthy weight  Body mass index (BMI) is a measurement that can be used to identify possible weight problems. It estimates body fat based on height and weight. Your health care provider can help determine your BMI and help you achieve or maintain a  healthy weight.  For females 37 years of age and older:  A BMI below 18.5 is considered underweight.  A BMI of 18.5 to 24.9 is normal.  A BMI of 25 to 29.9 is considered overweight.  A BMI of 30 and above is considered obese. Watch levels of cholesterol and blood lipids  You should start having your blood tested for lipids and cholesterol at 56 years of age, then have this test every 5 years.  You may need to have your cholesterol levels checked more often if:  Your lipid or cholesterol levels are high.  You are older than 56 years of age.  You are at high risk for heart disease. Cancer screening Lung Cancer  Lung cancer screening is recommended for adults 66-77 years old who are at high risk for lung cancer because of a history of smoking.  A yearly low-dose CT scan of the lungs is recommended for people who:  Currently smoke.  Have quit within the past 15 years.  Have at least a 30-pack-year history of smoking. A pack year is smoking an average of one pack of cigarettes a day for 1 year.  Yearly screening should continue until it has been 15 years since you quit.  Yearly screening should stop if you develop a health problem that would prevent you from having lung cancer treatment. Breast Cancer  Practice breast self-awareness. This means understanding how your breasts normally appear and feel.  It also means doing regular breast self-exams. Let your health care provider know about any changes, no matter how small.  If you are in  your 20s or 30s, you should have a clinical breast exam (CBE) by a health care provider every 1-3 years as part of a regular health exam.  If you are 35 or older, have a CBE every year. Also consider having a breast X-ray (mammogram) every year.  If you have a family history of breast cancer, talk to your health care provider about genetic screening.  If you are at high risk for breast cancer, talk to your health care provider about having  an MRI and a mammogram every year.  Breast cancer gene (BRCA) assessment is recommended for women who have family members with BRCA-related cancers. BRCA-related cancers include:  Breast.  Ovarian.  Tubal.  Peritoneal cancers.  Results of the assessment will determine the need for genetic counseling and BRCA1 and BRCA2 testing. Cervical Cancer  Your health care provider may recommend that you be screened regularly for cancer of the pelvic organs (ovaries, uterus, and vagina). This screening involves a pelvic examination, including checking for microscopic changes to the surface of your cervix (Pap test). You may be encouraged to have this screening done every 3 years, beginning at age 67.  For women ages 11-65, health care providers may recommend pelvic exams and Pap testing every 3 years, or they may recommend the Pap and pelvic exam, combined with testing for human papilloma virus (HPV), every 5 years. Some types of HPV increase your risk of cervical cancer. Testing for HPV may also be done on women of any age with unclear Pap test results.  Other health care providers may not recommend any screening for nonpregnant women who are considered low risk for pelvic cancer and who do not have symptoms. Ask your health care provider if a screening pelvic exam is right for you.  If you have had past treatment for cervical cancer or a condition that could lead to cancer, you need Pap tests and screening for cancer for at least 20 years after your treatment. If Pap tests have been discontinued, your risk factors (such as having a new sexual partner) need to be reassessed to determine if screening should resume. Some women have medical problems that increase the chance of getting cervical cancer. In these cases, your health care provider may recommend more frequent screening and Pap tests. Colorectal Cancer  This type of cancer can be detected and often prevented.  Routine colorectal cancer screening  usually begins at 56 years of age and continues through 56 years of age.  Your health care provider may recommend screening at an earlier age if you have risk factors for colon cancer.  Your health care provider may also recommend using home test kits to check for hidden blood in the stool.  A small camera at the end of a tube can be used to examine your colon directly (sigmoidoscopy or colonoscopy). This is done to check for the earliest forms of colorectal cancer.  Routine screening usually begins at age 78.  Direct examination of the colon should be repeated every 5-10 years through 56 years of age. However, you may need to be screened more often if early forms of precancerous polyps or small growths are found. Skin Cancer  Check your skin from head to toe regularly.  Tell your health care provider about any new moles or changes in moles, especially if there is a change in a mole's shape or color.  Also tell your health care provider if you have a mole that is larger than the size of a  pencil eraser.  Always use sunscreen. Apply sunscreen liberally and repeatedly throughout the day.  Protect yourself by wearing long sleeves, pants, a wide-brimmed hat, and sunglasses whenever you are outside. Heart disease, diabetes, and high blood pressure  High blood pressure causes heart disease and increases the risk of stroke. High blood pressure is more likely to develop in:  People who have blood pressure in the high end of the normal range (130-139/85-89 mm Hg).  People who are overweight or obese.  People who are African American.  If you are 74-5 years of age, have your blood pressure checked every 3-5 years. If you are 27 years of age or older, have your blood pressure checked every year. You should have your blood pressure measured twice-once when you are at a hospital or clinic, and once when you are not at a hospital or clinic. Record the average of the two measurements. To check your  blood pressure when you are not at a hospital or clinic, you can use:  An automated blood pressure machine at a pharmacy.  A home blood pressure monitor.  If you are between 48 years and 61 years old, ask your health care provider if you should take aspirin to prevent strokes.  Have regular diabetes screenings. This involves taking a blood sample to check your fasting blood sugar level.  If you are at a normal weight and have a low risk for diabetes, have this test once every three years after 56 years of age.  If you are overweight and have a high risk for diabetes, consider being tested at a younger age or more often. Preventing infection Hepatitis B  If you have a higher risk for hepatitis B, you should be screened for this virus. You are considered at high risk for hepatitis B if:  You were born in a country where hepatitis B is common. Ask your health care provider which countries are considered high risk.  Your parents were born in a high-risk country, and you have not been immunized against hepatitis B (hepatitis B vaccine).  You have HIV or AIDS.  You use needles to inject street drugs.  You live with someone who has hepatitis B.  You have had sex with someone who has hepatitis B.  You get hemodialysis treatment.  You take certain medicines for conditions, including cancer, organ transplantation, and autoimmune conditions. Hepatitis C  Blood testing is recommended for:  Everyone born from [birth year] through 1966.  Anyone with known risk factors for hepatitis C. Sexually transmitted infections (STIs)  You should be screened for sexually transmitted infections (STIs) including gonorrhea and chlamydia if:  You are sexually active and are younger than 56 years of age.  You are older than 56 years of age and your health care provider tells you that you are at risk for this type of infection.  Your sexual activity has changed since you were last screened and you are at an  increased risk for chlamydia or gonorrhea. Ask your health care provider if you are at risk.  If you do not have HIV, but are at risk, it may be recommended that you take a prescription medicine daily to prevent HIV infection. This is called pre-exposure prophylaxis (PrEP). You are considered at risk if:  You are sexually active and do not regularly use condoms or know the HIV status of your partner(s).  You take drugs by injection.  You are sexually active with a partner who has HIV. Talk with your  health care provider about whether you are at high risk of being infected with HIV. If you choose to begin PrEP, you should first be tested for HIV. You should then be tested every 3 months for as long as you are taking PrEP. Pregnancy  If you are premenopausal and you may become pregnant, ask your health care provider about preconception counseling.  If you may become pregnant, take 400 to 800 micrograms (mcg) of folic acid every day.  If you want to prevent pregnancy, talk to your health care provider about birth control (contraception). Osteoporosis and menopause  Osteoporosis is a disease in which the bones lose minerals and strength with aging. This can result in serious bone fractures. Your risk for osteoporosis can be identified using a bone density scan.  If you are 87 years of age or older, or if you are at risk for osteoporosis and fractures, ask your health care provider if you should be screened.  Ask your health care provider whether you should take a calcium or vitamin D supplement to lower your risk for osteoporosis.  Menopause may have certain physical symptoms and risks.  Hormone replacement therapy may reduce some of these symptoms and risks. Talk to your health care provider about whether hormone replacement therapy is right for you. Follow these instructions at home:  Schedule regular health, dental, and eye exams.  Stay current with your immunizations.  Do not use  any tobacco products including cigarettes, chewing tobacco, or electronic cigarettes.  If you are pregnant, do not drink alcohol.  If you are breastfeeding, limit how much and how often you drink alcohol.  Limit alcohol intake to no more than 1 drink per day for nonpregnant women. One drink equals 12 ounces of beer, 5 ounces of wine, or 1 ounces of hard liquor.  Do not use street drugs.  Do not share needles.  Ask your health care provider for help if you need support or information about quitting drugs.  Tell your health care provider if you often feel depressed.  Tell your health care provider if you have ever been abused or do not feel safe at home. This information is not intended to replace advice given to you by your health care provider. Make sure you discuss any questions you have with your health care provider. Document Released: 12/26/2010 Document Revised: 11/18/2015 Document Reviewed: 03/16/2015  2017 [employer]vier

## 2016-05-25 NOTE — Progress Notes (Signed)
Patient ID: Allison Pham, female   DOB: 27-Jul-1960, 55 y.o.   MRN: [id number]   Subjective:   Allison Pham is a 55 y.o. female here for a complete physical exam  Interim issues since last visit: patient has had labs regarding her liver and is worried about them; her sister is here with her today  USPSTF grade A and B recommendations Alcohol: no Depression: Depression screen [facility] 2/9 05/25/2016 05/15/2016 10/29/2015 05/25/2015 04/23/2015  Decreased Interest 0 0 0 1 2  Down, Depressed, Hopeless 0 1 0 1 0  PHQ - 2 Score 0 1 0 2 2  Altered sleeping - - - - 2  Tired, decreased energy - - - - 3  Change in appetite - - - - 0  Feeling bad or failure about yourself  - - - - 0  Trouble concentrating - - - - 0  Moving slowly or fidgety/restless - - - - 0  Suicidal thoughts - - - - 0  PHQ-9 Score - - - - 7  Difficult doing work/chores - - - - Somewhat difficult    Hypertension: controlled Obesity: losing weight, trying Tobacco use: quit this spring HIV, hep B, hep C: test hepatitis, already had hiv testing STD testing and prevention (chl/gon/syphilis): no sx Lipids: brought down almost 30 points LDL last check Glucose: normal Colorectal cancer: 2013; mother passed from colon cancer, next due 2018 Breast cancer: done 2017, do yearly BRCA gene screening: no breast or ovarian Intimate partner violence:  no Cervical cancer screening: no cervix Lung cancer: not 30 pack year, quit Osteoporosis: start 53 Fall prevention/vitamin D: was taking it, will get back on 1000 iu vit D3 daily Aspirin: taking 81 mg aspirin daily most days Diet: better eater than before, not much, only one time a day Exercise: not regularly now; does not like exercise Skin cancer: nothing worrisome  Went to dermatologist for something in finger  Past Medical History:  Diagnosis Date  . Anxiety   . Hypercholesteremia   . Hypertension   . Tick bite    Past Surgical History:  Procedure Laterality Date  .  ABDOMINAL HYSTERECTOMY     Partial  . HEMORRHOID SURGERY     Family History  Problem Relation Age of Onset  . Cancer Mother     Colon  . Hypertension Mother   . Hypertension Father   . Heart attack Father   . Heart disease Father   . Hypertension Sister   . COPD Sister   . Hypertension Brother   . Heart disease Brother   . Cancer Maternal Aunt   . Cancer Maternal Uncle   . Diabetes Neg Hx   . Stroke Neg Hx   anemia runs in the family  Social History  Substance Use Topics  . Smoking status: Former Smoker    Packs/day: 1.00    Types: Cigarettes    Quit date: 09/13/2015  . Smokeless tobacco: Never Used  . Alcohol use No   Review of Systems  Objective:   Vitals:   05/25/16 1010  BP: 130/78  Pulse: 82  Resp: 14  Temp: 98.1 F (36.7 C)  TempSrc: Oral  SpO2: 96%  Weight: 152 lb 9 oz (69.2 kg)  Height: 5' 0.5" (1.537 m)   Body mass index is 29.3 kg/m. Wt Readings from Last 3 Encounters:  05/25/16 152 lb 9 oz (69.2 kg)  05/15/16 152 lb (68.9 kg)  01/26/16 160 lb (72.6 kg)   Physical Exam  Constitutional: She appears well-developed and well-nourished.  HENT:  Head: Normocephalic and atraumatic.  Right Ear: Hearing, tympanic membrane, external ear and ear canal normal.  Left Ear: Hearing, tympanic membrane, external ear and ear canal normal.  Eyes: Conjunctivae and EOM are normal. Right eye exhibits no hordeolum. Left eye exhibits no hordeolum. No scleral icterus.  Neck: Carotid bruit is not present. No thyromegaly present.  Cardiovascular: Normal rate, regular rhythm, S1 normal, S2 normal and normal heart sounds.   No extrasystoles are present.  Pulmonary/Chest: Effort normal and breath sounds normal. No respiratory distress. Right breast exhibits no inverted nipple, no mass, no nipple discharge, no skin change and no tenderness. Left breast exhibits no inverted nipple, no mass, no nipple discharge, no skin change and no tenderness. Breasts are symmetrical.   Abdominal: Soft. Normal appearance and bowel sounds are normal. She exhibits no distension, no abdominal bruit, no pulsatile midline mass and no mass. There is no hepatosplenomegaly. There is no tenderness. No hernia.  Musculoskeletal: Normal range of motion. She exhibits no edema.  Lymphadenopathy:       Head (right side): No submandibular adenopathy present.       Head (left side): No submandibular adenopathy present.    She has no cervical adenopathy.    She has no axillary adenopathy.  Neurological: She is alert. She displays no tremor. No cranial nerve deficit. She exhibits normal muscle tone. Gait normal.  Reflex Scores:      Patellar reflexes are 2+ on the right side and 2+ on the left side. Skin: Skin is warm and dry. No bruising and no ecchymosis noted. No cyanosis. No pallor.  Psychiatric: Her speech is normal and behavior is normal. Thought content normal. Her mood appears not anxious. She does not exhibit a depressed mood.   Assessment/Plan:   Problem List Items Addressed This Visit      Other   Preventative health care - Primary    USPSTF grade A and B recommendations reviewed with patient; age-appropriate recommendations, preventive care, screening tests, etc discussed and encouraged; healthy living encouraged; see AVS for patient education given to patient      Pelvic pain in female    This was brought up at the end of the visit; pelvic not done; will check pelvic US      Relevant Orders   US Pelvis Complete   US Transvaginal Non-OB   Urinalysis w microscopic + reflex cultur (Completed)   Elevated serum GGT level    Check labs today; reviewed [place] and other labs      Relevant Orders   Anti-Smooth Muscle Antibody, IGG (Completed)   ANA,IFA RA Diag Pnl w/rflx Tit/Patn (Completed)   Hepatitis panel, acute (Completed)   Ceruloplasmin (Completed)   Cytomegalovirus antibody, IgG (Completed)   PTT   Protime-INR   Ferritin   Ambulatory referral to Gastroenterology    Elevated serum alkaline phosphatase level    Check labs and refer to GI      Relevant Orders   Anti-Smooth Muscle Antibody, IGG (Completed)   ANA,IFA RA Diag Pnl w/rflx Tit/Patn (Completed)   Hepatitis panel, acute (Completed)   Ceruloplasmin (Completed)   Cytomegalovirus antibody, IgG (Completed)   PTT   Protime-INR   Ferritin   Ambulatory referral to Gastroenterology      No orders of the defined types were placed in this encounter.  Orders Placed This Encounter  Procedures  . US Pelvis Complete    Order Specific Question:   Reason for Exam (  SYMPTOM  OR DIAGNOSIS REQUIRED)    Answer:   right side pelvic pain; hx of ovarian cyst    Order Specific Question:   Preferred imaging location?    Answer:   [facility]  . US Transvaginal Non-OB    Order Specific Question:   Reason for Exam (SYMPTOM  OR DIAGNOSIS REQUIRED)    Answer:   right sided pelvic pain; hx of ovarian cyst    Order Specific Question:   Preferred imaging location?    Answer:   [facility]  . Anti-Smooth Muscle Antibody, IGG  . ANA,IFA RA Diag Pnl w/rflx Tit/Patn  . Hepatitis panel, acute  . Ceruloplasmin  . Cytomegalovirus antibody, IgG  . PTT  . Protime-INR  . Ferritin  . Urinalysis w microscopic + reflex cultur  . APTT  . Ferritin  . Ambulatory referral to Gastroenterology    Referral Priority:   Routine    Referral Type:   Consultation    Referral Reason:   Specialty Services Required    Number of Visits Requested:   1    Follow up plan: Return in about 1 year (around 05/25/2017) for complete physical, but I am here for you any time.  An After Visit Summary was printed and given to the patient.

## 2016-05-25 NOTE — Assessment & Plan Note (Signed)
Check labs and refer to GI

## 2016-05-25 NOTE — Assessment & Plan Note (Addendum)
This was brought up at the end of the visit; pelvic not done; will check pelvic US

## 2016-05-25 NOTE — Assessment & Plan Note (Signed)
Check labs today; reviewed [place] and other labs

## 2016-05-26 ENCOUNTER — Other Ambulatory Visit: Payer: Self-pay

## 2016-05-26 DIAGNOSIS — R7989 Other specified abnormal findings of blood chemistry: Secondary | ICD-10-CM

## 2016-05-26 LAB — URINALYSIS W MICROSCOPIC + REFLEX CULTURE
BACTERIA UA: NONE SEEN [HPF]
BILIRUBIN URINE: NEGATIVE
Casts: NONE SEEN [LPF]
Crystals: NONE SEEN [HPF]
GLUCOSE, UA: NEGATIVE
Hgb urine dipstick: NEGATIVE
KETONES UR: NEGATIVE
LEUKOCYTES UA: NEGATIVE
Nitrite: NEGATIVE
Protein, ur: NEGATIVE
SPECIFIC GRAVITY, URINE: 1.01 (ref 1.001–1.035)
Yeast: NONE SEEN [HPF]
pH: 6 (ref 5.0–8.0)

## 2016-05-26 LAB — APTT: aPTT: 27 s (ref 22–34)

## 2016-05-26 LAB — HEPATITIS PANEL, ACUTE
HCV Ab: NEGATIVE
Hep A IgM: NONREACTIVE
Hep B C IgM: NONREACTIVE
Hepatitis B Surface Ag: NEGATIVE

## 2016-05-26 LAB — FERRITIN: Ferritin: 287 ng/mL — ABNORMAL HIGH (ref 10–232)

## 2016-05-26 LAB — CYTOMEGALOVIRUS ANTIBODY, IGG

## 2016-05-26 LAB — ANTI-SMOOTH MUSCLE ANTIBODY, IGG

## 2016-05-27 ENCOUNTER — Telehealth: Payer: Self-pay | Admitting: Family Medicine

## 2016-05-27 NOTE — Telephone Encounter (Signed)
Please follow-up with patient about her six month breast ultrasound; I ordered it on 04/28/16; due around 05/17/16 I don't see that it's been scheduled yet; thank you ------------------------ Copied from May 22nd report -- RECOMMENDATION: Right breast ultrasound suggested in 6 months to assess stability.

## 2016-05-28 NOTE — Assessment & Plan Note (Signed)
USPSTF grade A and B recommendations reviewed with patient; age-appropriate recommendations, preventive care, screening tests, etc discussed and encouraged; healthy living encouraged; see AVS for patient education given to patient  

## 2016-05-29 ENCOUNTER — Telehealth: Payer: Self-pay | Admitting: Family Medicine

## 2016-05-29 DIAGNOSIS — R7989 Other specified abnormal findings of blood chemistry: Secondary | ICD-10-CM | POA: Diagnosis not present

## 2016-05-29 LAB — ANA,IFA RA DIAG PNL W/RFLX TIT/PATN
ANA: NEGATIVE
Cyclic Citrullin Peptide Ab: <16 Units

## 2016-05-29 LAB — CERULOPLASMIN: CERULOPLASMIN: 29 mg/dL (ref 18–53)

## 2016-05-29 NOTE — Telephone Encounter (Signed)
Patient has been informed via voice mail of her appt on 06/13/16 @ 9:20am and that if she could not make the appt to call [place] to reschedule

## 2016-05-29 NOTE — Telephone Encounter (Signed)
Patient has been scheduled for 06/13/16 @ 9:20am

## 2016-05-29 NOTE — Telephone Encounter (Signed)
Please check on the PT/INR that was ordered; that is usually back by now; thank you

## 2016-05-30 ENCOUNTER — Encounter: Payer: Self-pay | Admitting: Family Medicine

## 2016-05-30 DIAGNOSIS — R7401 Elevation of levels of liver transaminase levels: Secondary | ICD-10-CM | POA: Insufficient documentation

## 2016-05-30 DIAGNOSIS — R109 Unspecified abdominal pain: Secondary | ICD-10-CM | POA: Insufficient documentation

## 2016-05-30 DIAGNOSIS — R740 Nonspecific elevation of levels of transaminase and lactic acid dehydrogenase [LDH]: Secondary | ICD-10-CM

## 2016-05-30 NOTE — Telephone Encounter (Signed)
I looked at the lab orders. It seems that It wasn't order right so it didn't print out with the other labs. I will talk to angie about adding it. :)

## 2016-05-30 NOTE — Telephone Encounter (Signed)
Allison Pham talked with me; cannot be added on; I ordered clinic collect instead of lab collect, my error No need for her to come back in Thank you

## 2016-05-31 ENCOUNTER — Telehealth: Payer: Self-pay

## 2016-05-31 ENCOUNTER — Ambulatory Visit
Admission: RE | Admit: 2016-05-31 | Discharge: 2016-05-31 | Disposition: A | Discharge status: Home / Self Care | Payer: Medicare Other | Source: Ambulatory Visit | Attending: Family Medicine | Admitting: Family Medicine

## 2016-05-31 DIAGNOSIS — R102 Pelvic and perineal pain: Secondary | ICD-10-CM | POA: Insufficient documentation

## 2016-05-31 NOTE — Telephone Encounter (Signed)
Please review labs. 

## 2016-06-01 NOTE — Telephone Encounter (Signed)
Patient notified

## 2016-06-01 NOTE — Telephone Encounter (Signed)
I sent her a MyChart note for the ceruloplasmin, but I have nothing new to review. The other test is still pending. Please let her know to check her MyChart account over the next several days, as that's how I'll let her know about the last test when it comes back. It may take up to a week, though.

## 2016-06-03 LAB — HEMOCHROMATOSIS DNA-PCR(C282Y,H63D)

## 2016-06-13 ENCOUNTER — Ambulatory Visit (INDEPENDENT_AMBULATORY_CARE_PROVIDER_SITE_OTHER): Payer: Medicare Other | Admitting: Gastroenterology

## 2016-06-13 ENCOUNTER — Ambulatory Visit: Payer: Medicare Other

## 2016-06-13 ENCOUNTER — Encounter: Payer: Self-pay | Admitting: Gastroenterology

## 2016-06-13 VITALS — BP 107/74 | HR 82 | Temp 97.8°F | Ht 60.5 in | Wt 152.0 lb

## 2016-06-13 DIAGNOSIS — R748 Abnormal levels of other serum enzymes: Secondary | ICD-10-CM | POA: Diagnosis not present

## 2016-06-13 NOTE — Progress Notes (Signed)
Primary Care Physician: Enid Derry, MD  Primary Gastroenterologist:  Dr. [name]  Chief Complaint  Patient presents with  . Elevated Hepatic Enzymes    HPI: Allison Pham is a 55 y.o. female here With elevated liver enzymes.  The patient had elevated liver his enzymes approximately a year ago and then had repeat labs that were normal 6 months ago.  The patient  Now had repeat labs with only an isolated ALTs slightly elevated at 36.  The patient had a history of increased alkaline phosphatase and had the isoenzyme of the alkaline phosphatase sent off with Macrohepatic Isoenzyme being elevated.  The total alk phosphatase of this blood draw was normal at 113. The normal level should be 0 and it was 21%. The patient had an ultrasound of the liver that was normal.  She had a slightly elevated ferritin and had a negative hemachromatosis gene.  The patient was on cholesterol medication  Which was thought to possibly being the cause of her increased liver enzymes and  That was stopped. The rest of her workup for the abnormal liver enzymes were negative.  The patient denies any change in bowel habits or rectal bleeding.  She also denies any unexplained weight loss fevers or chills. The patient had a colonoscopy 2 years ago with for hyperplastic polyps  but has a family history of colon cancer.  Current Outpatient Prescriptions  Medication Sig Dispense Refill  . busPIRone (BUSPAR) 5 MG tablet Take 0.5-1 tablets (2.5-5 mg total) by mouth 3 (three) times daily. 270 tablet 1  . fluticasone (FLONASE) 50 MCG/ACT nasal spray Place 1 spray into both nostrils 2 (two) times daily. 16 g 0  . gabapentin (NEURONTIN) 300 MG capsule Take 1 capsule (300 mg total) by mouth at bedtime. 90 capsule 0  . lisinopril (PRINIVIL,ZESTRIL) 5 MG tablet Take 1 tablet (5 mg total) by mouth daily. For blood pressure 90 tablet 1  . naproxen (NAPROSYN) 375 MG tablet Take 1 tablet (375 mg total) by mouth 2 (two) times daily with a  meal. If needed for neck or hip pain 180 tablet 0   No current facility-administered medications for this visit.     Allergies as of 06/13/2016 - Review Complete 06/13/2016  Allergen Reaction Noted  . Penicillins Rash 01/07/2015    ROS:  General: Negative for anorexia, weight loss, fever, chills, fatigue, weakness. ENT: Negative for hoarseness, difficulty swallowing , nasal congestion. CV: Negative for chest pain, angina, palpitations, dyspnea on exertion, peripheral edema.  Respiratory: Negative for dyspnea at rest, dyspnea on exertion, cough, sputum, wheezing.  GI: See history of present illness. GU:  Negative for dysuria, hematuria, urinary incontinence, urinary frequency, nocturnal urination.  Endo: Negative for unusual weight change.    Physical Examination:   BP 107/74   Pulse 82   Temp 97.8 F (36.6 C) (Oral)   Ht 5' 0.5" (1.537 m)   Wt 152 lb (68.9 kg)   BMI 29.20 kg/m   General: Well-nourished, well-developed in no acute distress.  Eyes: No icterus. Conjunctivae pink. Mouth: Oropharyngeal mucosa moist and pink , no lesions erythema or exudate. Lungs: Clear to auscultation bilaterally. Non-labored. Heart: Regular rate and rhythm, no murmurs rubs or gallops.  Abdomen: Bowel sounds are normal, nontender, nondistended, no hepatosplenomegaly or masses, no abdominal bruits or hernia , no rebound or guarding.   Extremities: No lower extremity edema. No clubbing or deformities. Neuro: Alert and oriented x 3.  Grossly intact. Skin: Warm and dry, no jaundice.  Psych: Alert and cooperative, normal mood and affect.  Labs:    Imaging Studies: US Transvaginal Non-ob  Result Date: 05/31/2016 CLINICAL DATA:  55 year old female with right side pelvic pain. Symptoms for 4 months. Prior hysterectomy, the patient still has ovaries. Postmenopausal not on hormone replacement therapy. Initial encounter. EXAM: TRANSABDOMINAL AND TRANSVAGINAL ULTRASOUND OF PELVIS TECHNIQUE: Both  transabdominal and transvaginal ultrasound examinations of the pelvis were performed. Transabdominal technique was performed for global imaging of the pelvis including uterus, ovaries, adnexal regions, and pelvic cul-de-sac. It was necessary to proceed with endovaginal exam following the transabdominal exam to visualize the ovaries. COMPARISON:  Right upper quadrant ultrasound 05/23/2016. FINDINGS: Uterus Measurements: Surgically absent. Endometrium Thickness: Surgically absent. Right ovary Measurements: Not visualized despite trans abdominal and transvaginal attempts. Normal appearing bowel loops and vascular structures are identified in the right hemipelvis. Left ovary Measurements: Not visualized despite trans abdominal and transvaginal attempts. Normal appearing bowel loops and vascular structures are identified in the left hemipelvis. Other findings No pelvic free fluid.  Unremarkable urinary bladder. IMPRESSION: Ovaries could not be identified. No pelvic mass, fluid, or abnormality is evident by ultrasound. Electronically Signed   By: Genevie Ann M.D.   On: 05/31/2016 13:06   US Pelvis Complete  Result Date: 05/31/2016 CLINICAL DATA:  55 year old female with right side pelvic pain. Symptoms for 4 months. Prior hysterectomy, the patient still has ovaries. Postmenopausal not on hormone replacement therapy. Initial encounter. EXAM: TRANSABDOMINAL AND TRANSVAGINAL ULTRASOUND OF PELVIS TECHNIQUE: Both transabdominal and transvaginal ultrasound examinations of the pelvis were performed. Transabdominal technique was performed for global imaging of the pelvis including uterus, ovaries, adnexal regions, and pelvic cul-de-sac. It was necessary to proceed with endovaginal exam following the transabdominal exam to visualize the ovaries. COMPARISON:  Right upper quadrant ultrasound 05/23/2016. FINDINGS: Uterus Measurements: Surgically absent. Endometrium Thickness: Surgically absent. Right ovary Measurements: Not  visualized despite trans abdominal and transvaginal attempts. Normal appearing bowel loops and vascular structures are identified in the right hemipelvis. Left ovary Measurements: Not visualized despite trans abdominal and transvaginal attempts. Normal appearing bowel loops and vascular structures are identified in the left hemipelvis. Other findings No pelvic free fluid.  Unremarkable urinary bladder. IMPRESSION: Ovaries could not be identified. No pelvic mass, fluid, or abnormality is evident by ultrasound. Electronically Signed   By: Genevie Ann M.D.   On: 05/31/2016 13:06   US Abdomen Limited Ruq  Result Date: 05/23/2016 CLINICAL DATA:  Elevated liver function tests EXAM: US ABDOMEN LIMITED - RIGHT UPPER QUADRANT COMPARISON:  None. FINDINGS: Gallbladder: The gallbladder is visualized and no gallstones are noted. There is no pain over the gallbladder with compression. Common bile duct: Diameter: The common bile duct is normal measuring 4 mm in diameter. Liver: The liver has a normal echogenic pattern. No focal hepatic abnormality is seen IMPRESSION: Negative limited ultrasound of the right upper quadrant. Electronically Signed   By: Ivar Drape M.D.   On: 05/23/2016 10:37    Assessment and Plan:   Allison Pham is a 56 y.o. y/o female with an isolated  AST elevation and a history of increased alkaline phosphatase.   The patient had  The alkaline phosphatase checked 4 weeks ago which was  Normal except the fractionation showed the macro hepatic isoenzyme to be elevated.   The patient will have her  Labs sent off to check her liver enzymes again.  If they are elevated she may need an MRI of the liver.  She will also have her iron  checked because of her history of increased ferritin.    [name], MD. Marval Regal   Note: This dictation was prepared with Dragon dictation along with smaller phrase technology. Any transcriptional errors that result from this process are unintentional.

## 2016-06-14 LAB — HEPATIC FUNCTION PANEL
ALK PHOS: 114 IU/L (ref 39–117)
ALT: 33 IU/L — AB (ref 0–32)
AST: 27 IU/L (ref 0–40)
Albumin: 4.8 g/dL (ref 3.5–5.5)
BILIRUBIN, DIRECT: 0.14 mg/dL (ref 0.00–0.40)
Bilirubin Total: 0.5 mg/dL (ref 0.0–1.2)
Total Protein: 7.3 g/dL (ref 6.0–8.5)

## 2016-06-14 LAB — IRON AND TIBC
Iron Saturation: 27 % (ref 15–55)
Iron: 79 ug/dL (ref 27–159)
TIBC: 295 ug/dL (ref 250–450)
UIBC: 216 ug/dL (ref 131–425)

## 2016-06-15 ENCOUNTER — Ambulatory Visit: Payer: Medicare Other | Admitting: Gastroenterology

## 2016-06-16 ENCOUNTER — Telehealth: Payer: Self-pay | Admitting: Gastroenterology

## 2016-06-16 NOTE — Telephone Encounter (Signed)
results

## 2016-06-21 ENCOUNTER — Telehealth: Payer: Self-pay | Admitting: Gastroenterology

## 2016-06-21 ENCOUNTER — Other Ambulatory Visit: Payer: Self-pay

## 2016-06-21 DIAGNOSIS — R748 Abnormal levels of other serum enzymes: Secondary | ICD-10-CM

## 2016-06-21 NOTE — Telephone Encounter (Signed)
Pt scheduled for an MRI of the liver w/wo contrast at [facility] on Tuesday, Jan 9th @ 1:00pm. Pt has been notified of the date, time, location and instructions.  Pt has been notified of lab results.

## 2016-06-21 NOTE — Telephone Encounter (Signed)
-----   Message from [name], MD sent at 06/20/2016  8:23 AM EST ----- Let the patient know that the liver enzymes were lower than before. She should already be set up for an MRI of the liver with contrast

## 2016-06-21 NOTE — Telephone Encounter (Signed)
-----   Message from [name], MD sent at 06/15/2016  7:57 AM EST ----- Let the patient know the iron was normal and that the liver enzymes were better but still up. She should be set up for an MRI of the liver because of the labs.

## 2016-06-21 NOTE — Telephone Encounter (Signed)
Duplicate message.  See other message.

## 2016-06-21 NOTE — Telephone Encounter (Signed)
results

## 2016-07-04 ENCOUNTER — Ambulatory Visit
Admission: RE | Admit: 2016-07-04 | Discharge: 2016-07-04 | Disposition: A | Discharge status: Home / Self Care | Payer: Medicare Other | Source: Ambulatory Visit | Attending: Gastroenterology | Admitting: Gastroenterology

## 2016-07-04 DIAGNOSIS — N281 Cyst of kidney, acquired: Secondary | ICD-10-CM | POA: Insufficient documentation

## 2016-07-04 DIAGNOSIS — R928 Other abnormal and inconclusive findings on diagnostic imaging of breast: Secondary | ICD-10-CM | POA: Insufficient documentation

## 2016-07-04 DIAGNOSIS — R7989 Other specified abnormal findings of blood chemistry: Secondary | ICD-10-CM | POA: Diagnosis not present

## 2016-07-04 DIAGNOSIS — R748 Abnormal levels of other serum enzymes: Secondary | ICD-10-CM

## 2016-07-04 LAB — POCT I-STAT CREATININE: Creatinine, Ser: 0.7 mg/dL (ref 0.44–1.00)

## 2016-07-04 MED ORDER — GADOBENATE DIMEGLUMINE 529 MG/ML IV SOLN
15.0000 mL | Freq: Once | INTRAVENOUS | Status: AC | PRN
  Administered 2016-07-04: 13 mL via INTRAVENOUS

## 2016-07-05 ENCOUNTER — Ambulatory Visit
Admission: RE | Admit: 2016-07-05 | Discharge: 2016-07-05 | Disposition: A | Discharge status: Home / Self Care | Payer: Medicare Other | Source: Ambulatory Visit | Attending: Family Medicine | Admitting: Family Medicine

## 2016-07-05 DIAGNOSIS — N6320 Unspecified lump in the left breast, unspecified quadrant: Secondary | ICD-10-CM | POA: Diagnosis not present

## 2016-07-05 DIAGNOSIS — R928 Other abnormal and inconclusive findings on diagnostic imaging of breast: Secondary | ICD-10-CM | POA: Diagnosis not present

## 2016-07-05 DIAGNOSIS — N281 Cyst of kidney, acquired: Secondary | ICD-10-CM | POA: Diagnosis not present

## 2016-07-11 ENCOUNTER — Telehealth: Payer: Self-pay | Admitting: Gastroenterology

## 2016-07-11 NOTE — Telephone Encounter (Signed)
-----   Message from [name], MD sent at 07/09/2016  8:23 AM EST ----- That the patient know that the liver and the bile ducts were all normal without any signs of cancer or precancerous lesions.

## 2016-07-11 NOTE — Telephone Encounter (Signed)
Left vm letting pt know results.  

## 2016-07-11 NOTE — Telephone Encounter (Signed)
Patient is returning your phone call regarding results. She works from home and can't answer when she's online. She stated you may leave a detailed message with results on the answering machine

## 2016-07-20 ENCOUNTER — Other Ambulatory Visit: Payer: Self-pay | Admitting: Family Medicine

## 2016-07-20 NOTE — Telephone Encounter (Signed)
Last Cr and K+ reviewed; Rxs approved 

## 2016-07-26 ENCOUNTER — Telehealth: Payer: Self-pay | Admitting: Family Medicine

## 2016-07-26 ENCOUNTER — Ambulatory Visit
Admission: EM | Admit: 2016-07-26 | Discharge: 2016-07-26 | Disposition: A | Discharge status: Home / Self Care | Payer: Medicare Other | Attending: Family Medicine | Admitting: Family Medicine

## 2016-07-26 ENCOUNTER — Encounter: Payer: Self-pay | Admitting: *Deleted

## 2016-07-26 ENCOUNTER — Ambulatory Visit (INDEPENDENT_AMBULATORY_CARE_PROVIDER_SITE_OTHER): Payer: Medicare Other

## 2016-07-26 DIAGNOSIS — T148XXA Other injury of unspecified body region, initial encounter: Secondary | ICD-10-CM | POA: Diagnosis not present

## 2016-07-26 DIAGNOSIS — S161XXA Strain of muscle, fascia and tendon at neck level, initial encounter: Secondary | ICD-10-CM

## 2016-07-26 DIAGNOSIS — M546 Pain in thoracic spine: Secondary | ICD-10-CM | POA: Diagnosis not present

## 2016-07-26 DIAGNOSIS — S299XXA Unspecified injury of thorax, initial encounter: Secondary | ICD-10-CM | POA: Diagnosis not present

## 2016-07-26 DIAGNOSIS — S199XXA Unspecified injury of neck, initial encounter: Secondary | ICD-10-CM | POA: Diagnosis not present

## 2016-07-26 MED ORDER — CYCLOBENZAPRINE HCL 5 MG PO TABS: 5.0000 mg | ORAL_TABLET | Freq: Every evening | ORAL | 0 refills | Status: DC | PRN

## 2016-07-26 NOTE — Telephone Encounter (Signed)
Pt would like a call back

## 2016-07-26 NOTE — Discharge Instructions (Signed)
Take medication as prescribed. Rest. Drink plenty of fluids. Alternate heat and ice for comfort.   Follow up with your primary care physician this week as needed. Return to Urgent care for new or worsening concerns.
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
## 2016-08-23 ENCOUNTER — Other Ambulatory Visit: Payer: Self-pay | Admitting: Family Medicine

## 2016-08-23 NOTE — Telephone Encounter (Signed)
9 month supply of gabapentin approved

## 2016-09-18 ENCOUNTER — Other Ambulatory Visit: Payer: Self-pay | Admitting: Family Medicine

## 2016-09-19 ENCOUNTER — Other Ambulatory Visit: Payer: Self-pay

## 2016-09-19 MED ORDER — BUSPIRONE HCL 5 MG PO TABS: 2.5000 mg | ORAL_TABLET | Freq: Three times a day (TID) | ORAL | 1 refills | Status: DC

## 2016-12-28 ENCOUNTER — Telehealth: Payer: Self-pay | Admitting: Family Medicine

## 2016-12-28 DIAGNOSIS — R928 Other abnormal and inconclusive findings on diagnostic imaging of breast: Secondary | ICD-10-CM

## 2016-12-28 NOTE — Assessment & Plan Note (Signed)
Order additional images

## 2016-12-28 NOTE — Telephone Encounter (Signed)
Please order and schedule; thank you

## 2016-12-28 NOTE — Telephone Encounter (Signed)
-----   Message from Arnetha Courser, MD sent at 07/13/2016  3:56 PM EST ----- Regarding: breast imaging due January 02, 2017 RECOMMENDATION: Bilateral diagnostic mammogram and right breast ultrasound in 6 Months. Due January 02, 2017

## 2017-01-01 ENCOUNTER — Other Ambulatory Visit: Payer: Self-pay

## 2017-01-01 DIAGNOSIS — N6310 Unspecified lump in the right breast, unspecified quadrant: Secondary | ICD-10-CM

## 2017-01-01 NOTE — Telephone Encounter (Signed)
Left detailled voicemail

## 2017-01-01 NOTE — Telephone Encounter (Signed)
Ordered, patient notified august 8 3:20

## 2017-01-31 ENCOUNTER — Other Ambulatory Visit: Payer: Self-pay

## 2017-01-31 ENCOUNTER — Inpatient Hospital Stay: Admission: RE | Admit: 2017-01-31 | Payer: Self-pay | Source: Ambulatory Visit

## 2017-02-19 ENCOUNTER — Other Ambulatory Visit: Payer: Self-pay | Admitting: Family Medicine

## 2017-02-19 DIAGNOSIS — I10 Essential (primary) hypertension: Secondary | ICD-10-CM

## 2017-02-19 NOTE — Telephone Encounter (Signed)
Last cr reviewed; will need appt for further refills but I approved 90 day supply to pharmacy I'd like to see her for hypertension, elevated cholesterol, and elevated liver enzymes in the next month or so please She'll need a separate appt for her physical due in November It looks like she no-showed for breast imaging; please f/u on that too; thank you

## 2017-02-20 NOTE — Telephone Encounter (Signed)
Patient notified, will schedule an appt an would like to talk to you about breast imaging.

## 2017-03-21 ENCOUNTER — Encounter: Payer: Self-pay | Admitting: Family Medicine

## 2017-03-21 ENCOUNTER — Ambulatory Visit (INDEPENDENT_AMBULATORY_CARE_PROVIDER_SITE_OTHER): Payer: Medicare Other | Admitting: Family Medicine

## 2017-03-21 VITALS — BP 134/82 | HR 76 | Temp 98.3°F | Resp 14 | Wt 155.3 lb

## 2017-03-21 DIAGNOSIS — R748 Abnormal levels of other serum enzymes: Secondary | ICD-10-CM

## 2017-03-21 DIAGNOSIS — D72821 Monocytosis (symptomatic): Secondary | ICD-10-CM

## 2017-03-21 DIAGNOSIS — R928 Other abnormal and inconclusive findings on diagnostic imaging of breast: Secondary | ICD-10-CM

## 2017-03-21 DIAGNOSIS — E663 Overweight: Secondary | ICD-10-CM

## 2017-03-21 DIAGNOSIS — R112 Nausea with vomiting, unspecified: Secondary | ICD-10-CM

## 2017-03-21 DIAGNOSIS — I10 Essential (primary) hypertension: Secondary | ICD-10-CM

## 2017-03-21 DIAGNOSIS — R740 Nonspecific elevation of levels of transaminase and lactic acid dehydrogenase [LDH]: Secondary | ICD-10-CM

## 2017-03-21 DIAGNOSIS — E785 Hyperlipidemia, unspecified: Secondary | ICD-10-CM | POA: Diagnosis not present

## 2017-03-21 DIAGNOSIS — Z1211 Encounter for screening for malignant neoplasm of colon: Secondary | ICD-10-CM | POA: Insufficient documentation

## 2017-03-21 DIAGNOSIS — R42 Dizziness and giddiness: Secondary | ICD-10-CM

## 2017-03-21 DIAGNOSIS — R197 Diarrhea, unspecified: Secondary | ICD-10-CM

## 2017-03-21 DIAGNOSIS — R7401 Elevation of levels of liver transaminase levels: Secondary | ICD-10-CM

## 2017-03-21 LAB — CBC WITH DIFFERENTIAL/PLATELET
BASOS ABS: 59 {cells}/uL (ref 0–200)
Basophils Relative: 0.9 %
Eosinophils Absolute: 211 cells/uL (ref 15–500)
Eosinophils Relative: 3.2 %
HCT: 45 % (ref 35.0–45.0)
Hemoglobin: 15.3 g/dL (ref 11.7–15.5)
LYMPHS ABS: 2785 {cells}/uL (ref 850–3900)
MCH: 28.1 pg (ref 27.0–33.0)
MCHC: 34 g/dL (ref 32.0–36.0)
MCV: 82.6 fL (ref 80.0–100.0)
MPV: 11 fL (ref 7.5–12.5)
Monocytes Relative: 9 %
NEUTROS PCT: 44.7 %
Neutro Abs: 2950 cells/uL (ref 1500–7800)
PLATELETS: 228 10*3/uL (ref 140–400)
RBC: 5.45 10*6/uL — AB (ref 3.80–5.10)
RDW: 13.2 % (ref 11.0–15.0)
Total Lymphocyte: 42.2 %
WBC: 6.6 10*3/uL (ref 3.8–10.8)
WBCMIX: 594 {cells}/uL (ref 200–950)

## 2017-03-21 LAB — LIPID PANEL
Cholesterol: 294 mg/dL — ABNORMAL HIGH (ref <200)
HDL: 49 mg/dL — AB (ref >50)
LDL Cholesterol (Calc): 214 mg/dL (calc) — ABNORMAL HIGH
NON-HDL CHOLESTEROL (CALC): 245 mg/dL — AB (ref <130)
Total CHOL/HDL Ratio: 6 (calc) — ABNORMAL HIGH (ref <5.0)
Triglycerides: 149 mg/dL (ref <150)

## 2017-03-21 LAB — COMPLETE METABOLIC PANEL WITH GFR
AG RATIO: 1.7 (calc) (ref 1.0–2.5)
ALKALINE PHOSPHATASE (APISO): 95 U/L (ref 33–130)
ALT: 23 U/L (ref 6–29)
AST: 20 U/L (ref 10–35)
Albumin: 4.3 g/dL (ref 3.6–5.1)
BILIRUBIN TOTAL: 0.6 mg/dL (ref 0.2–1.2)
BUN: 18 mg/dL (ref 7–25)
CHLORIDE: 105 mmol/L (ref 98–110)
CO2: 28 mmol/L (ref 20–32)
Calcium: 9.6 mg/dL (ref 8.6–10.4)
Creat: 0.85 mg/dL (ref 0.50–1.05)
GFR, EST NON AFRICAN AMERICAN: 76 mL/min/{1.73_m2} (ref >=60)
GFR, Est African American: 88 mL/min/{1.73_m2} (ref >=60)
GLOBULIN: 2.5 g/dL (ref 1.9–3.7)
Glucose, Bld: 84 mg/dL (ref 65–139)
POTASSIUM: 4.5 mmol/L (ref 3.5–5.3)
SODIUM: 139 mmol/L (ref 135–146)
Total Protein: 6.8 g/dL (ref 6.1–8.1)

## 2017-03-21 MED ORDER — RANITIDINE HCL 150 MG PO TABS: 150.0000 mg | ORAL_TABLET | Freq: Every day | ORAL | 5 refills | Status: DC

## 2017-03-21 NOTE — Assessment & Plan Note (Signed)
Additional imaging ordered

## 2017-03-21 NOTE — Progress Notes (Signed)
BP 134/82   Pulse 76   Temp 98.3 F (36.8 C) (Oral)   Resp 14   Wt 155 lb 4.8 oz (70.4 kg)   SpO2 94%   BMI 29.34 kg/m    Subjective:    Patient ID: Allison Pham, female    DOB: [birth year], 56 y.o.   MRN: [id number]  HPI: Allison Pham is a 56 y.o. female  Chief Complaint  Patient presents with  . Follow-up  . Dizziness  . Gastroesophageal Reflux    HPI Patient is here for follow-up She has high cholesterol; she is using an air fryer now which takes care of the grease, cooks with air She just got it and is supposed to lose weight with it Lab Results  Component Value Date   CHOL 294 (H) 03/21/2017   HDL 49 (L) 03/21/2017   LDLCALC 177 (H) 05/15/2016   TRIG 149 03/21/2017   CHOLHDL 6.0 (H) 03/21/2017   She has GERD; trigger foods include: orange juice, stuff from [employer] b/c of grease; her sister suggested something  She has had diarrhea and gas for two weeks, gas is just rolling in her stomach; limits her appetite; not sure if a virus; no fevers; sister had similar symptoms, she is still sick; water is city water; no sick pets; no pet turtles; we went to restaurant on Sept 18th, had seafood, then threw up  One liver enzyme was high; saw specialist, not alcoholic, nothing to worry about was what they said Saw Dr. Allen Norris, had MRI of liver IMPRESSION: 1. Normal liver and biliary tree. 2. Normal pancreas. 3. Bilateral benign-appearing Bosniak 1 renal cysts.   Electronically Signed   By: Suzy Bouchard M.D.   On: 07/04/2016 16:43  She is c/o some dizziness; she starts by saying that she is on full disability for her "nerves"; she panics a lot; as a young girl she never got to go to school because she was a nervous child; tried to go to school, but couldn't; full disability; her sister used to be her payee; Vikki Ports is her daughter and is her payee; about a month ago, she went to the dentist; had her teeth cleaned; when they picked her up out of the chair, she  was dizzy; walked sideways going to the car; then threw up at home; laid down and then getting up, she will throw up; thinks it is vertigo; she thinks it is related to her ears; patient feels like she is spinning, not the room; she has not tried anything; this happened years ago and she gave her medicine to take like when you get on an airplane she says, but it was a quick fix; sometimes triggered by rolling over in bed  She did not go back for f/u breast imaging; she cannot feel any lump Last imaging from January 2018: RECOMMENDATION: Bilateral diagnostic mammogram and right breast ultrasound in 6 Months.   Depression screen [facility] 2/9 03/21/2017 05/25/2016 05/15/2016 10/29/2015 05/25/2015  Decreased Interest 0 0 0 0 1  Down, Depressed, Hopeless 0 0 1 0 1  PHQ - 2 Score 0 0 1 0 2  Altered sleeping - - - - -  Tired, decreased energy - - - - -  Change in appetite - - - - -  Feeling bad or failure about yourself  - - - - -  Trouble concentrating - - - - -  Moving slowly or fidgety/restless - - - - -  Suicidal thoughts - - - - -  PHQ-9 Score - - - - -  Difficult doing work/chores - - - - -    Relevant past medical, surgical, family and social history reviewed Past Medical History:  Diagnosis Date  . Anxiety   . Hypercholesteremia   . Hypertension   . Tick bite    Past Surgical History:  Procedure Laterality Date  . ABDOMINAL HYSTERECTOMY     Partial  . HEMORRHOID SURGERY     Family History  Problem Relation Age of Onset  . Cancer Mother        Colon  . Hypertension Mother   . Hypertension Father   . Heart attack Father   . Heart disease Father   . Hypertension Sister   . COPD Sister   . Hypertension Brother   . Heart disease Brother   . Cancer Maternal Aunt   . Cancer Maternal Uncle   . Diabetes Neg Hx   . Stroke Neg Hx    Social History   Social History  . Marital status: Single    Spouse name: N/A  . Number of children: N/A  . Years of education: N/A    Occupational History  . Not on file.   Social History Main Topics  . Smoking status: Former Smoker    Packs/day: 1.00    Types: Cigarettes    Quit date: 09/13/2015  . Smokeless tobacco: Never Used  . Alcohol use No  . Drug use: No  . Sexual activity: No   Other Topics Concern  . Not on file   Social History Narrative  . No narrative on file    Interim medical history since last visit reviewed. Allergies and medications reviewed  Review of Systems Per HPI unless specifically indicated above     Objective:    BP 134/82   Pulse 76   Temp 98.3 F (36.8 C) (Oral)   Resp 14   Wt 155 lb 4.8 oz (70.4 kg)   SpO2 94%   BMI 29.34 kg/m   Wt Readings from Last 3 Encounters:  03/21/17 155 lb 4.8 oz (70.4 kg)  07/26/16 153 lb (69.4 kg)  06/13/16 152 lb (68.9 kg)    Physical Exam  Constitutional: She appears well-developed and well-nourished. No distress.  HENT:  Head: Normocephalic and atraumatic.  Eyes: EOM are normal. No scleral icterus.  Neck: No thyromegaly present.  Cardiovascular: Normal rate, regular rhythm and normal heart sounds.   No murmur heard. Pulmonary/Chest: Effort normal and breath sounds normal. No respiratory distress. She has no wheezes.  Abdominal: Soft. Bowel sounds are normal. She exhibits no distension and no mass. There is no tenderness. There is no guarding.  Musculoskeletal: Normal range of motion. She exhibits no edema.  Neurological: She is alert.  Skin: Skin is warm and dry. She is not diaphoretic. No pallor.  Psychiatric: She has a normal mood and affect. Her behavior is normal. Judgment and thought content normal.       Assessment & Plan:   Problem List Items Addressed This Visit      Cardiovascular and Mediastinum   Essential hypertension, benign    Fair control today        Other   Screen for colon cancer    Refer back to Dr. Allen Norris for screening colonoscopy      Relevant Orders   Ambulatory referral to Gastroenterology    Overweight (BMI 25.0-29.9)    Encouragement given to lose 20-25 pounds over 6-12 months      Monocytosis  Check CBC      Relevant Orders   CBC with Differential/Platelet (Completed)   Dyslipidemia    Check lipids; try to limit saturated fat      Relevant Orders   Lipid panel (Completed)   Dizziness    Refer to ENT      Relevant Orders   Ambulatory referral to ENT   Abnormal mammogram of right breast    Additional imaging ordered      Relevant Orders   MM Digital Diagnostic Bilat   US BREAST COMPLETE UNI RIGHT INC AXILLA   RESOLVED: Elevated serum alkaline phosphatase level    Check lab today; saw specialist already      Relevant Orders   COMPLETE METABOLIC PANEL WITH GFR (Completed)   RESOLVED: Elevated alanine aminotransferase (ALT) level    Check today      Relevant Orders   COMPLETE METABOLIC PANEL WITH GFR (Completed)    Other Visit Diagnoses    Non-intractable vomiting with nausea, unspecified vomiting type    -  Primary   Relevant Orders   COMPLETE METABOLIC PANEL WITH GFR (Completed)   Diarrhea of presumed infectious origin       Relevant Orders   Gastrointestinal Pathogen Panel PCR   CBC with Differential/Platelet (Completed)   COMPLETE METABOLIC PANEL WITH GFR (Completed)      Follow up plan: Return in about 4 weeks (around 04/18/2017) for follow-up visit with Dr. Sanda Klein.  An after-visit summary was printed and given to the patient at [place].  Please see the patient instructions which may contain other information and recommendations beyond what is mentioned above in the assessment and plan.  Meds ordered this encounter  Medications  . cholecalciferol (VITAMIN D) 1000 units tablet    Sig: Take 1,000 Units by mouth daily.  Marland Kitchen loratadine (CLARITIN) 10 MG tablet    Sig: Take 10 mg by mouth daily as needed for allergies.  . ranitidine (ZANTAC) 150 MG tablet    Sig: Take 1 tablet (150 mg total) by mouth at bedtime.    Dispense:  30 tablet     Refill:  5    Orders Placed This Encounter  Procedures  . MM Digital Diagnostic Bilat  . US BREAST COMPLETE UNI RIGHT INC AXILLA  . Gastrointestinal Pathogen Panel PCR  . CBC with Differential/Platelet  . COMPLETE METABOLIC PANEL WITH GFR  . Lipid panel  . Ambulatory referral to ENT  . Ambulatory referral to Gastroenterology

## 2017-03-21 NOTE — Assessment & Plan Note (Signed)
Check CBC 

## 2017-03-21 NOTE — Assessment & Plan Note (Signed)
Encouragement given to lose 20-25 pounds over 6-12 months

## 2017-03-21 NOTE — Assessment & Plan Note (Signed)
Check lipids; try to limit saturated fat

## 2017-03-21 NOTE — Assessment & Plan Note (Signed)
Refer back to Dr. Allen Norris for screening colonoscopy

## 2017-03-21 NOTE — Assessment & Plan Note (Signed)
Refer to ENT

## 2017-03-21 NOTE — Assessment & Plan Note (Signed)
Fair control today 

## 2017-03-21 NOTE — Patient Instructions (Addendum)
Stop naproxen Try turmeric as a natural anti-inflammatory (for pain and arthritis). It comes in capsules where you buy aspirin and fish oil, but also as a spice where you buy pepper and garlic powder. If you need something for aches or pains, try to use Tylenol (acetaminophen) instead of non-steroidals (which include Aleve, ibuprofen, Advil, Motrin, and naproxen); non-steroidals can cause long-term kidney damage Let's get labs today Start ranitidine to help with acid We'll have Dr. Allen Norris see you for a colonoscopy We'll have the Ear Nose Throat doctor see you for your dizzines Try to limit saturated fats in your diet (bologna, hot dogs, barbeque, cheeseburgers, hamburgers, steak, bacon, sausage, cheese, etc.) and get more fresh fruits, vegetables, and whole grains

## 2017-03-21 NOTE — Assessment & Plan Note (Signed)
Check today 

## 2017-03-21 NOTE — Assessment & Plan Note (Signed)
Check lab today; saw specialist already

## 2017-03-26 ENCOUNTER — Other Ambulatory Visit: Payer: Self-pay | Admitting: Family Medicine

## 2017-03-26 DIAGNOSIS — E785 Hyperlipidemia, unspecified: Secondary | ICD-10-CM

## 2017-03-26 DIAGNOSIS — Z5181 Encounter for therapeutic drug level monitoring: Secondary | ICD-10-CM

## 2017-03-26 MED ORDER — PRAVASTATIN SODIUM 40 MG PO TABS: 40.0000 mg | ORAL_TABLET | Freq: Every day | ORAL | 1 refills | Status: DC

## 2017-03-26 NOTE — Progress Notes (Signed)
Patient has already been on lipitor Will try pravastatin Recheck lipids and sgpt in 6 weeks

## 2017-03-27 DIAGNOSIS — R42 Dizziness and giddiness: Secondary | ICD-10-CM | POA: Diagnosis not present

## 2017-04-10 ENCOUNTER — Telehealth: Payer: Self-pay | Admitting: Family Medicine

## 2017-04-10 ENCOUNTER — Telehealth: Payer: Self-pay | Admitting: Gastroenterology

## 2017-04-10 MED ORDER — PRAVASTATIN SODIUM 40 MG PO TABS: 40.0000 mg | ORAL_TABLET | Freq: Every day | ORAL | 1 refills | Status: DC

## 2017-04-10 MED ORDER — RANITIDINE HCL 150 MG PO TABS: 150.0000 mg | ORAL_TABLET | Freq: Every day | ORAL | 5 refills | Status: DC

## 2017-04-10 NOTE — Telephone Encounter (Signed)
I updated pharmacy and gave her # to [facility] to setup diagnostic mammo and u/s.  Patient states you told her about some type of herb for her neck pain to try? Was it tumeric?

## 2017-04-10 NOTE — Telephone Encounter (Signed)
Patient ready to schedule her procedure with Dr. Allen Norris in [place].

## 2017-04-10 NOTE — Telephone Encounter (Signed)
Requesting return call from [facility]. States her prescriptions has been sent to the wrong pharmacy. Please change in the system to [facility]. Also, she never received a call pertaining to her [facility]. States the doctor was switching places for her. [phone]

## 2017-04-10 NOTE — Telephone Encounter (Signed)
I resent two prescriptions to [place], turmeric is what I usually recommend; it's in her after visit summary

## 2017-04-11 NOTE — Telephone Encounter (Signed)
Patient notified

## 2017-04-12 ENCOUNTER — Telehealth: Payer: Self-pay | Admitting: Gastroenterology

## 2017-04-12 NOTE — Telephone Encounter (Signed)
Left vm for pt to return my call to schedule colonoscopy. 

## 2017-04-12 NOTE — Telephone Encounter (Signed)
Raisa called and is ready to schedule her colonoscopy.

## 2017-04-13 ENCOUNTER — Other Ambulatory Visit: Payer: Self-pay

## 2017-04-13 DIAGNOSIS — Z1211 Encounter for screening for malignant neoplasm of colon: Secondary | ICD-10-CM

## 2017-04-13 MED ORDER — NA SULFATE-K SULFATE-MG SULF 17.5-3.13-1.6 GM/177ML PO SOLN: 1.0000 | ORAL | 0 refills | Status: DC

## 2017-04-13 NOTE — Telephone Encounter (Signed)
Pt scheduled for a colonoscopy at [facility] on 04/19/17.

## 2017-04-16 ENCOUNTER — Encounter: Payer: Self-pay | Admitting: *Deleted

## 2017-04-16 ENCOUNTER — Other Ambulatory Visit: Payer: Self-pay

## 2017-05-07 ENCOUNTER — Encounter: Payer: Self-pay | Admitting: *Deleted

## 2017-05-07 ENCOUNTER — Other Ambulatory Visit: Payer: Self-pay | Admitting: Family Medicine

## 2017-05-07 ENCOUNTER — Telehealth: Payer: Self-pay | Admitting: Family Medicine

## 2017-05-07 ENCOUNTER — Ambulatory Visit
Admission: RE | Admit: 2017-05-07 | Discharge: 2017-05-07 | Disposition: A | Discharge status: Home / Self Care | Payer: Medicare Other | Source: Ambulatory Visit | Attending: Family Medicine | Admitting: Family Medicine

## 2017-05-07 DIAGNOSIS — N644 Mastodynia: Secondary | ICD-10-CM

## 2017-05-07 DIAGNOSIS — N6310 Unspecified lump in the right breast, unspecified quadrant: Secondary | ICD-10-CM

## 2017-05-07 DIAGNOSIS — R928 Other abnormal and inconclusive findings on diagnostic imaging of breast: Secondary | ICD-10-CM | POA: Insufficient documentation

## 2017-05-07 DIAGNOSIS — N6001 Solitary cyst of right breast: Secondary | ICD-10-CM | POA: Insufficient documentation

## 2017-05-07 DIAGNOSIS — N6489 Other specified disorders of breast: Secondary | ICD-10-CM | POA: Diagnosis not present

## 2017-05-07 NOTE — Telephone Encounter (Signed)
Patient is in today for follow up on R breast- while in the office for breast US patient states she is having focal pain in Left breast at 3 o'clock and under axilla. Would like to go ahead with breast US on L as well while patient is in. Can he have the order. He will put request in to be signed.

## 2017-05-08 NOTE — Discharge Instructions (Signed)
General Anesthesia, Adult, Care After °These instructions provide you with information about caring for yourself after your procedure. Your health care provider may also give you more specific instructions. Your treatment has been planned according to current medical practices, but problems sometimes occur. Call your health care provider if you have any problems or questions after your procedure. °What can I expect after the procedure? °After the procedure, it is common to have: °· Vomiting. °· A sore throat. °· Mental slowness. ° °It is common to feel: °· Nauseous. °· Cold or shivery. °· Sleepy. °· Tired. °· Sore or achy, even in parts of your body where you did not have surgery. ° °Follow these instructions at home: °For at least 24 hours after the procedure: °· Do not: °? Participate in activities where you could fall or become injured. °? Drive. °? Use heavy machinery. °? Drink alcohol. °? Take sleeping pills or medicines that cause drowsiness. °? Make important decisions or sign legal documents. °? Take care of children on your own. °· Rest. °Eating and drinking °· If you vomit, drink water, juice, or soup when you can drink without vomiting. °· Drink enough fluid to keep your urine clear or pale yellow. °· Make sure you have little or no nausea before eating solid foods. °· Follow the diet recommended by your health care provider. °General instructions °· Have a responsible adult stay with you until you are awake and alert. °· Return to your normal activities as told by your health care provider. Ask your health care provider what activities are safe for you. °· Take over-the-counter and prescription medicines only as told by your health care provider. °· If you smoke, do not smoke without supervision. °· Keep all follow-up visits as told by your health care provider. This is important. °Contact a health care provider if: °· You continue to have nausea or vomiting at home, and medicines are not helpful. °· You  cannot drink fluids or start eating again. °· You cannot urinate after 8-12 hours. °· You develop a skin rash. °· You have fever. °· You have increasing redness at the site of your procedure. °Get help right away if: °· You have difficulty breathing. °· You have chest pain. °· You have unexpected bleeding. °· You feel that you are having a life-threatening or urgent problem. °This information is not intended to replace advice given to you by your health care provider. Make sure you discuss any questions you have with your health care provider. °Document Released: 09/18/2000 Document Revised: 11/15/2015 Document Reviewed: 05/27/2015 °Elsevier Interactive Patient Education © 2018 [employer] Inc[employer] ° °

## 2017-05-10 ENCOUNTER — Ambulatory Visit: Payer: Medicare Other | Admitting: Anesthesiology

## 2017-05-10 ENCOUNTER — Ambulatory Visit
Admission: RE | Admit: 2017-05-10 | Discharge: 2017-05-10 | Disposition: A | Discharge status: Home / Self Care | Payer: Medicare Other | Source: Ambulatory Visit | Attending: Gastroenterology | Admitting: Gastroenterology

## 2017-05-10 ENCOUNTER — Encounter
Admission: RE | Disposition: A | Discharge status: Home / Self Care | Payer: Self-pay | Source: Ambulatory Visit | Attending: Gastroenterology

## 2017-05-10 DIAGNOSIS — Z90710 Acquired absence of both cervix and uterus: Secondary | ICD-10-CM | POA: Insufficient documentation

## 2017-05-10 DIAGNOSIS — Z800 Family history of malignant neoplasm of digestive organs: Secondary | ICD-10-CM | POA: Insufficient documentation

## 2017-05-10 DIAGNOSIS — Z8249 Family history of ischemic heart disease and other diseases of the circulatory system: Secondary | ICD-10-CM | POA: Insufficient documentation

## 2017-05-10 DIAGNOSIS — I10 Essential (primary) hypertension: Secondary | ICD-10-CM | POA: Insufficient documentation

## 2017-05-10 DIAGNOSIS — F419 Anxiety disorder, unspecified: Secondary | ICD-10-CM | POA: Diagnosis not present

## 2017-05-10 DIAGNOSIS — K219 Gastro-esophageal reflux disease without esophagitis: Secondary | ICD-10-CM | POA: Insufficient documentation

## 2017-05-10 DIAGNOSIS — E7800 Pure hypercholesterolemia, unspecified: Secondary | ICD-10-CM | POA: Diagnosis not present

## 2017-05-10 DIAGNOSIS — M1990 Unspecified osteoarthritis, unspecified site: Secondary | ICD-10-CM | POA: Diagnosis not present

## 2017-05-10 DIAGNOSIS — Z1211 Encounter for screening for malignant neoplasm of colon: Secondary | ICD-10-CM

## 2017-05-10 DIAGNOSIS — Z79899 Other long term (current) drug therapy: Secondary | ICD-10-CM | POA: Diagnosis not present

## 2017-05-10 DIAGNOSIS — D122 Benign neoplasm of ascending colon: Secondary | ICD-10-CM | POA: Diagnosis not present

## 2017-05-10 DIAGNOSIS — D123 Benign neoplasm of transverse colon: Secondary | ICD-10-CM | POA: Insufficient documentation

## 2017-05-10 DIAGNOSIS — F17210 Nicotine dependence, cigarettes, uncomplicated: Secondary | ICD-10-CM | POA: Diagnosis not present

## 2017-05-10 DIAGNOSIS — K640 First degree hemorrhoids: Secondary | ICD-10-CM | POA: Diagnosis not present

## 2017-05-10 DIAGNOSIS — K635 Polyp of colon: Secondary | ICD-10-CM

## 2017-05-10 DIAGNOSIS — D125 Benign neoplasm of sigmoid colon: Secondary | ICD-10-CM | POA: Diagnosis not present

## 2017-05-10 HISTORY — DX: Presence of dental prosthetic device (complete) (partial): Z97.2

## 2017-05-10 HISTORY — DX: Dizziness and giddiness: R42

## 2017-05-10 HISTORY — DX: Gastro-esophageal reflux disease without esophagitis: K21.9

## 2017-05-10 HISTORY — PX: COLONOSCOPY WITH PROPOFOL: SHX5780

## 2017-05-10 HISTORY — PX: POLYPECTOMY: SHX5525

## 2017-05-10 SURGERY — COLONOSCOPY WITH PROPOFOL
Anesthesia: General

## 2017-05-10 MED ORDER — PROPOFOL 10 MG/ML IV BOLUS
INTRAVENOUS | Status: DC | PRN
  Administered 2017-05-10: 10 mg via INTRAVENOUS
  Administered 2017-05-10 (×5): 20 mg via INTRAVENOUS
  Administered 2017-05-10: 70 mg via INTRAVENOUS
  Administered 2017-05-10: 20 mg via INTRAVENOUS

## 2017-05-10 MED ORDER — SODIUM CHLORIDE 0.9 % IV SOLN: INTRAVENOUS | Status: DC

## 2017-05-10 MED ORDER — LACTATED RINGERS IV SOLN
INTRAVENOUS | Status: DC
  Administered 2017-05-10: 09:00:00 via INTRAVENOUS

## 2017-05-10 MED ORDER — STERILE WATER FOR IRRIGATION IR SOLN
Status: DC | PRN
  Administered 2017-05-10: 09:00:00

## 2017-05-10 MED ORDER — LIDOCAINE HCL (CARDIAC) 20 MG/ML IV SOLN
INTRAVENOUS | Status: DC | PRN
  Administered 2017-05-10: 50 mg via INTRAVENOUS

## 2017-05-10 SURGICAL SUPPLY — 23 items
CANISTER SUCT 1200ML W/VALVE (MISCELLANEOUS) ×2 IMPLANT
CLIP HMST 235XBRD CATH ROT (MISCELLANEOUS) IMPLANT
CLIP RESOLUTION 360 11X235 (MISCELLANEOUS)
FCP ESCP3.2XJMB 240X2.8X (MISCELLANEOUS) ×1
FORCEPS BIOP RAD 4 LRG CAP 4 (CUTTING FORCEPS) IMPLANT
FORCEPS BIOP RJ4 240 W/NDL (MISCELLANEOUS) ×1
FORCEPS ESCP3.2XJMB 240X2.8X (MISCELLANEOUS) ×1 IMPLANT
GOWN CVR UNV OPN BCK APRN NK (MISCELLANEOUS) ×2 IMPLANT
GOWN ISOL THUMB LOOP REG UNIV (MISCELLANEOUS) ×2
INJECTOR VARIJECT VIN23 (MISCELLANEOUS) IMPLANT
KIT DEFENDO VALVE AND CONN (KITS) IMPLANT
KIT ENDO PROCEDURE OLY (KITS) ×2 IMPLANT
MARKER SPOT ENDO TATTOO 5ML (MISCELLANEOUS) IMPLANT
PAD GROUND ADULT SPLIT (MISCELLANEOUS) IMPLANT
PROBE APC STR FIRE (PROBE) IMPLANT
RETRIEVER NET ROTH 2.5X230 LF (MISCELLANEOUS) IMPLANT
SNARE SHORT THROW 13M SML OVAL (MISCELLANEOUS) ×2 IMPLANT
SNARE SHORT THROW 30M LRG OVAL (MISCELLANEOUS) IMPLANT
SNARE SNG USE RND 15MM (INSTRUMENTS) IMPLANT
SPOT EX ENDOSCOPIC TATTOO (MISCELLANEOUS)
TRAP ETRAP POLY (MISCELLANEOUS) ×2 IMPLANT
VARIJECT INJECTOR VIN23 (MISCELLANEOUS)
WATER STERILE IRR 250ML POUR (IV SOLUTION) ×2 IMPLANT

## 2017-05-10 NOTE — Anesthesia Procedure Notes (Signed)
Performed by: Edlin Ford, CRNA Pre-anesthesia Checklist: Patient identified, Emergency Drugs available, Suction available, Timeout performed and Patient being monitored Patient Re-evaluated:Patient Re-evaluated prior to induction Oxygen Delivery Method: Nasal cannula Placement Confirmation: positive ETCO2       

## 2017-05-10 NOTE — Anesthesia Preprocedure Evaluation (Addendum)
Anesthesia Evaluation  Patient identified by MRN, date of birth, ID band Patient awake    Reviewed: Allergy & Precautions, NPO status , Patient's Chart, lab work & pertinent test results, reviewed documented beta blocker date and time   Airway Mallampati: II  TM Distance: <3 FB Neck ROM: Full    Dental no notable dental hx.    Pulmonary Current Smoker,    Pulmonary exam normal breath sounds clear to auscultation       Cardiovascular hypertension, Normal cardiovascular exam Rhythm:Regular Rate:Normal     Neuro/Psych Anxiety negative neurological ROS     GI/Hepatic Neg liver ROS, GERD  ,  Endo/Other  negative endocrine ROS  Renal/GU negative Renal ROS  negative genitourinary   Musculoskeletal  (+) Arthritis ,   Abdominal Normal abdominal exam  (+)  Abdomen: soft.    Peds  Hematology negative hematology ROS (+)   Anesthesia Other Findings   Reproductive/Obstetrics                            Anesthesia Physical Anesthesia Plan  ASA: II  Anesthesia Plan: General   Post-op Pain Management:    Induction: Intravenous  PONV Risk Score and Plan:   Airway Management Planned: Natural Airway and Nasal Cannula  Additional Equipment: None  Intra-op Plan:   Post-operative Plan:   Informed Consent: I have reviewed the patients History and Physical, chart, labs and discussed the procedure including the risks, benefits and alternatives for the proposed anesthesia with the patient or authorized representative who has indicated his/her understanding and acceptance.     Plan Discussed with: CRNA, Anesthesiologist and Surgeon  Anesthesia Plan Comments:         Anesthesia Quick Evaluation

## 2017-05-10 NOTE — H&P (Signed)
Allison Lame, MD [address] [place], [place] [id number] Phone: [phone] Fax : [phone]  Primary Care Physician:  Arnetha Courser, MD Primary Gastroenterologist:  Dr. Allen Norris  Pre-Procedure History & Physical: HPI:  Allison Pham is a 56 y.o. female is here for a screening colonoscopy.   Past Medical History:  Diagnosis Date  . Anxiety   . GERD (gastroesophageal reflux disease)   . Hypercholesteremia   . Hypertension   . Tick bite   . Vertigo   . Wears dentures    partial upper    Past Surgical History:  Procedure Laterality Date  . ABDOMINAL HYSTERECTOMY     Partial  . HEMORRHOID SURGERY      Prior to Admission medications   Medication Sig Start Date End Date Taking? Authorizing Provider  busPIRone (BUSPAR) 5 MG tablet Take 0.5-1 tablets (2.5-5 mg total) by mouth 3 (three) times daily. 09/19/16  Yes Lada, Satira Anis, MD  cholecalciferol (VITAMIN D) 1000 units tablet Take 1,000 Units by mouth daily.   Yes [provider]  fluticasone (FLONASE) 50 MCG/ACT nasal spray Place 1 spray into both nostrils 2 (two) times daily. 01/26/16  Yes Betancourt, Aura Fey, NP  gabapentin (NEURONTIN) 300 MG capsule Take 1 capsule (300 mg total) by mouth at bedtime. 08/23/16  Yes Lada, Satira Anis, MD  lisinopril (PRINIVIL,ZESTRIL) 5 MG tablet Take 1 tablet (5 mg total) by mouth daily. For blood pressure 02/19/17  Yes Lada, Satira Anis, MD  loratadine (CLARITIN) 10 MG tablet Take 10 mg by mouth daily as needed for allergies.   Yes [provider]  Na Sulfate-K Sulfate-Mg Sulf (SUPREP BOWEL PREP KIT) 17.5-3.13-1.6 GM/177ML SOLN Take 1 kit by mouth as directed. 04/13/17  Yes Allison Lame, MD  pravastatin (PRAVACHOL) 40 MG tablet Take 1 tablet (40 mg total) by mouth daily. 04/10/17  Yes Lada, Satira Anis, MD  cyclobenzaprine (FLEXERIL) 5 MG tablet Take 1 tablet (5 mg total) by mouth at bedtime as needed for muscle spasms. Do not drive while taking as can cause drowsiness.  07/26/16   Marylene Land, NP  ranitidine (ZANTAC) 150 MG tablet Take 1 tablet (150 mg total) by mouth at bedtime. Patient not taking: Reported on 05/10/2017 04/10/17   Arnetha Courser, MD    Allergies as of 04/13/2017 - Review Complete 03/21/2017  Allergen Reaction Noted  . Penicillins Rash 01/07/2015    Family History  Problem Relation Age of Onset  . Cancer Mother        Colon  . Hypertension Mother   . Hypertension Father   . Heart attack Father   . Heart disease Father   . Hypertension Sister   . COPD Sister   . Hypertension Brother   . Heart disease Brother   . Cancer Maternal Aunt   . Cancer Maternal Uncle   . Diabetes Neg Hx   . Stroke Neg Hx   . Breast cancer Neg Hx     Social History   Socioeconomic History  . Marital status: Single    Spouse name: Not on file  . Number of children: Not on file  . Years of education: Not on file  . Highest education level: Not on file  Social Needs  . Financial resource strain: Not on file  . Food insecurity - worry: Not on file  . Food insecurity - inability: Not on file  . Transportation needs - medical: Not on file  . Transportation needs - non-medical: Not  on file  Occupational History  . Not on file  Tobacco Use  . Smoking status: Current Every Day Smoker    Packs/day: 1.00    Types: Cigarettes    Last attempt to quit: 09/13/2015    Years since quitting: 1.6  . Smokeless tobacco: Never Used  Substance and Sexual Activity  . Alcohol use: Yes    Alcohol/week: 0.0 oz    Comment: rare - holidays  . Drug use: No  . Sexual activity: No  Other Topics Concern  . Not on file  Social History Narrative  . Not on file    Review of Systems: See HPI, otherwise negative ROS  Physical Exam: BP 111/82   Pulse 70   Temp 98.6 F (37 C) (Temporal)   Resp 16   Ht 5' 1"  (1.549 m)   Wt 154 lb (69.9 kg)   SpO2 97%   BMI 29.10 kg/m  General:   Alert,  pleasant and cooperative in NAD Head:  Normocephalic and  atraumatic. Neck:  Supple; no masses or thyromegaly. Lungs:  Clear throughout to auscultation.    Heart:  Regular rate and rhythm. Abdomen:  Soft, nontender and nondistended. Normal bowel sounds, without guarding, and without rebound.   Neurologic:  Alert and  oriented x4;  grossly normal neurologically.  Impression/Plan: Allison Pham is now here to undergo a screening colonoscopy.  Risks, benefits, and alternatives regarding colonoscopy have been reviewed with the patient.  Questions have been answered.  All parties agreeable.

## 2017-05-10 NOTE — Transfer of Care (Signed)
Immediate Anesthesia Transfer of Care Note  Patient: Allison Pham  Procedure(s) Performed: COLONOSCOPY WITH PROPOFOL (N/A ) POLYPECTOMY (N/A )  Patient Location: PACU  Anesthesia Type: General  Level of Consciousness: awake, alert  and patient cooperative  Airway and Oxygen Therapy: Patient Spontanous Breathing and Patient connected to supplemental oxygen  Post-op Assessment: Post-op Vital signs reviewed, Patient's Cardiovascular Status Stable, Respiratory Function Stable, Patent Airway and No signs of Nausea or vomiting  Post-op Vital Signs: Reviewed and stable  Complications: No apparent anesthesia complications

## 2017-05-10 NOTE — Anesthesia Postprocedure Evaluation (Signed)
Anesthesia Post Note  Patient: Allison Pham  Procedure(s) Performed: COLONOSCOPY WITH PROPOFOL (N/A ) POLYPECTOMY (N/A )  Patient location during evaluation: PACU Anesthesia Type: General Level of consciousness: awake Pain management: pain level controlled Vital Signs Assessment: post-procedure vital signs reviewed and stable Respiratory status: spontaneous breathing Cardiovascular status: blood pressure returned to baseline Postop Assessment: no headache Anesthetic complications: no    Lavonna Monarch

## 2017-05-10 NOTE — Op Note (Signed)
[facility] Patient Name: Allison Pham Procedure Date: 05/10/2017 9:24 AM MRN: [id number] Account #: [id number] Date of Birth: 1961/01/02 Admit Type: Outpatient Age: 56 Room: [facility] OR ROOM 01 Gender: Female Note Status: Finalized Procedure:            Colonoscopy Indications:          Screening for colorectal malignant neoplasm Providers:            [name] MD, MD Referring MD:         Arnetha Courser (Referring MD) Medicines:            Propofol per Anesthesia Complications:        No immediate complications. Procedure:            Pre-Anesthesia Assessment:                       - Prior to the procedure, a History and Physical was                        performed, and patient medications and allergies were                        reviewed. The patient's tolerance of previous                        anesthesia was also reviewed. The risks and benefits of                        the procedure and the sedation options and risks were                        discussed with the patient. All questions were                        answered, and informed consent was obtained. Prior                        Anticoagulants: The patient has taken no previous                        anticoagulant or antiplatelet agents. ASA Grade                        Assessment: II - A patient with mild systemic disease.                        After reviewing the risks and benefits, the patient was                        deemed in satisfactory condition to undergo the                        procedure.                       After obtaining informed consent, the colonoscope was                        passed under direct vision. Throughout the procedure,  the patient's blood pressure, pulse, and oxygen                        saturations were monitored continuously. The [place] [id number]) was introduced through the                     anus and advanced to the the cecum, identified by the                        appendiceal orifice, ileocecal valve and palpation. The                        colonoscopy was performed without difficulty. The                        patient tolerated the procedure well. The quality of                        the bowel preparation was excellent. Findings:      The perianal and digital rectal examinations were normal.      Two sessile polyps were found in the sigmoid colon. The polyps were 2 to       3 mm in size. These polyps were removed with a cold biopsy forceps.       Resection and retrieval were complete.      A 4 mm polyp was found in the ascending colon. The polyp was sessile.       The polyp was removed with a cold snare. Resection and retrieval were       complete.      A 3 mm polyp was found in the transverse colon. The polyp was sessile.       The polyp was removed with a cold snare. Resection and retrieval were       complete.      Non-bleeding internal hemorrhoids were found during retroflexion. The       hemorrhoids were Grade I (internal hemorrhoids that do not prolapse). Impression:           - Two 2 to 3 mm polyps in the sigmoid colon, removed                        with a cold biopsy forceps. Resected and retrieved.                       - One 4 mm polyp in the ascending colon, removed with a                        cold snare. Resected and retrieved.                       - One 3 mm polyp in the transverse colon, removed with                        a cold snare. Resected and retrieved.                       - Non-bleeding internal hemorrhoids. Recommendation:       -  Discharge patient to home.                       - Resume previous diet.                       - Continue present medications.                       - Await pathology results.                       - Repeat colonoscopy in 5 years if polyp adenoma and 10                        years if  hyperplastic Procedure Code(s):    --- Professional ---                       [phone], Colonoscopy, flexible; with removal of tumor(s),                        polyp(s), or other lesion(s) by snare technique                       [id number], 60, Colonoscopy, flexible; with biopsy, single                        or multiple Diagnosis Code(s):    --- Professional ---                       Z12.11, Encounter for screening for malignant neoplasm                        of colon                       D12.5, Benign neoplasm of sigmoid colon                       D12.2, Benign neoplasm of ascending colon                       D12.3, Benign neoplasm of transverse colon (hepatic                        flexure or splenic flexure) CPT copyright 2016 American Medical Association. All rights reserved. The codes documented in this report are preliminary and upon coder review may  be revised to meet current compliance requirements. [name] MD, MD 05/10/2017 9:47:20 AM This report has been signed electronically. Number of Addenda: 0 Note Initiated On: 05/10/2017 9:24 AM Scope Withdrawal Time: 0 hours 8 minutes 25 seconds  Total Procedure Duration: 0 hours 13 minutes 8 seconds       [facility]

## 2017-05-11 ENCOUNTER — Encounter: Payer: Self-pay | Admitting: Gastroenterology

## 2017-05-14 ENCOUNTER — Other Ambulatory Visit: Payer: Self-pay | Admitting: Family Medicine

## 2017-05-15 ENCOUNTER — Encounter: Payer: Self-pay | Admitting: Gastroenterology

## 2017-05-23 ENCOUNTER — Other Ambulatory Visit: Payer: Self-pay | Admitting: Family Medicine

## 2017-05-23 DIAGNOSIS — I10 Essential (primary) hypertension: Secondary | ICD-10-CM

## 2017-05-23 NOTE — Telephone Encounter (Signed)
Last Cr and K+ reviewed; Rx approved 

## 2017-07-04 ENCOUNTER — Encounter: Payer: Self-pay | Admitting: Family Medicine

## 2017-08-05 IMAGING — MG MM DIGITAL SCREENING BILAT W/ CAD
1 series · 4 of 4 positions shown · non-contrast
Comparison: Previous exam(s).

CLINICAL DATA: Screening.

EXAM:
DIGITAL SCREENING BILATERAL MAMMOGRAM WITH CAD

[R CC · right · 4 of 4 slices shown]
[im 1/4]
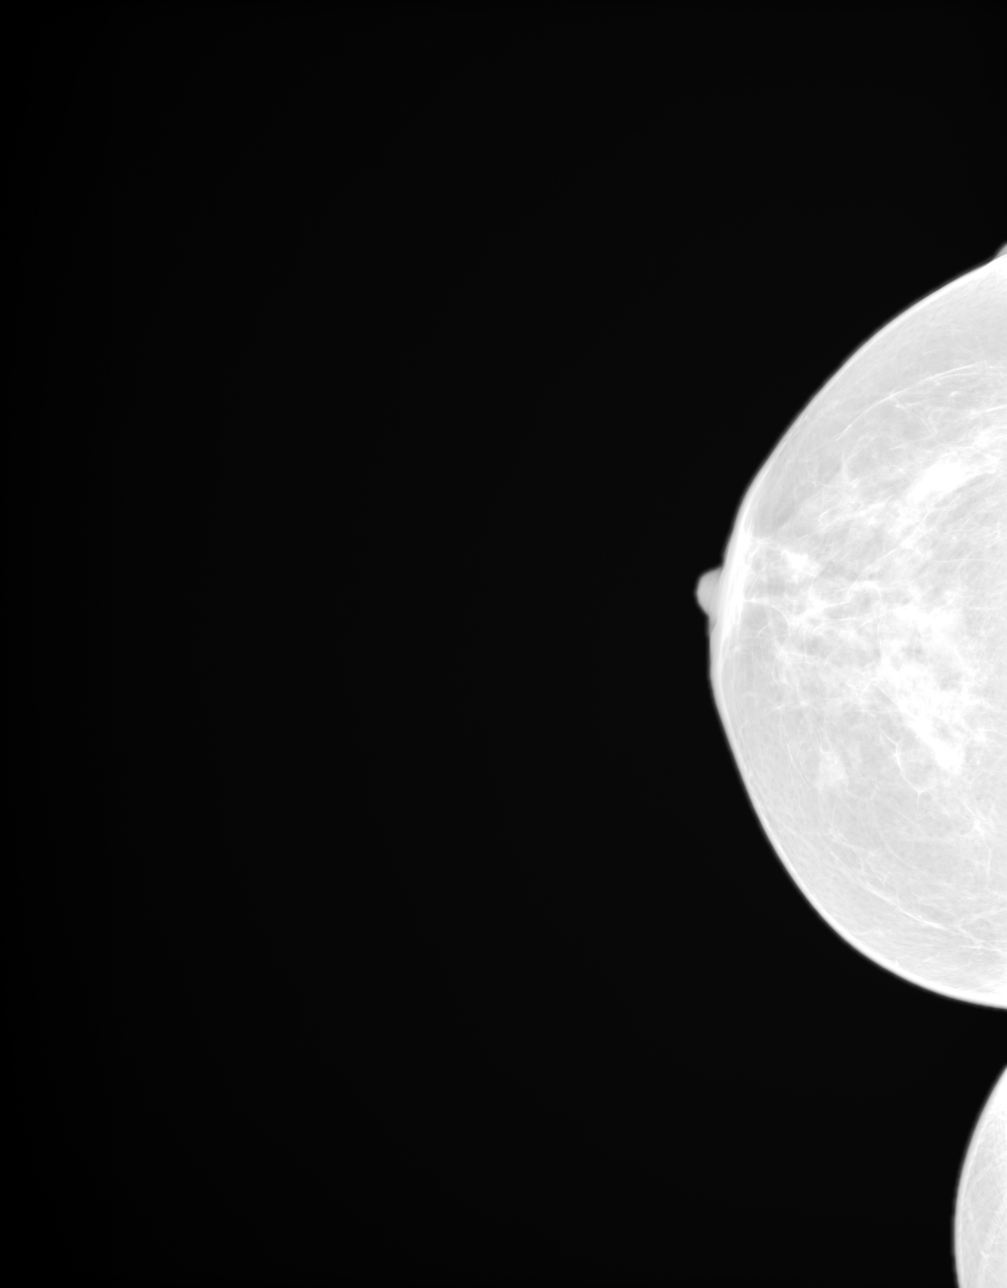
[im 2/4]
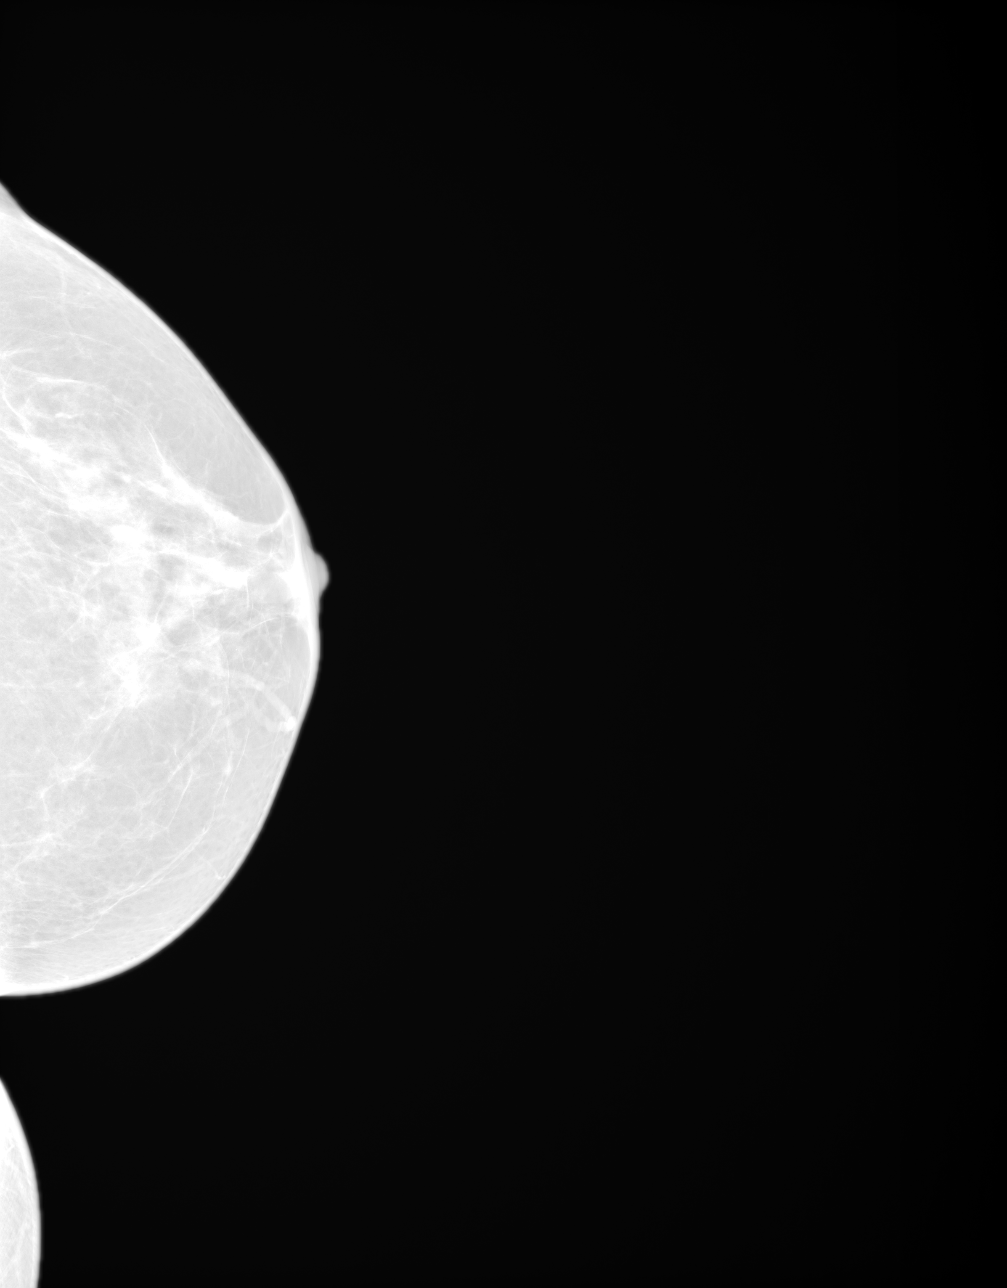
[im 3/4]
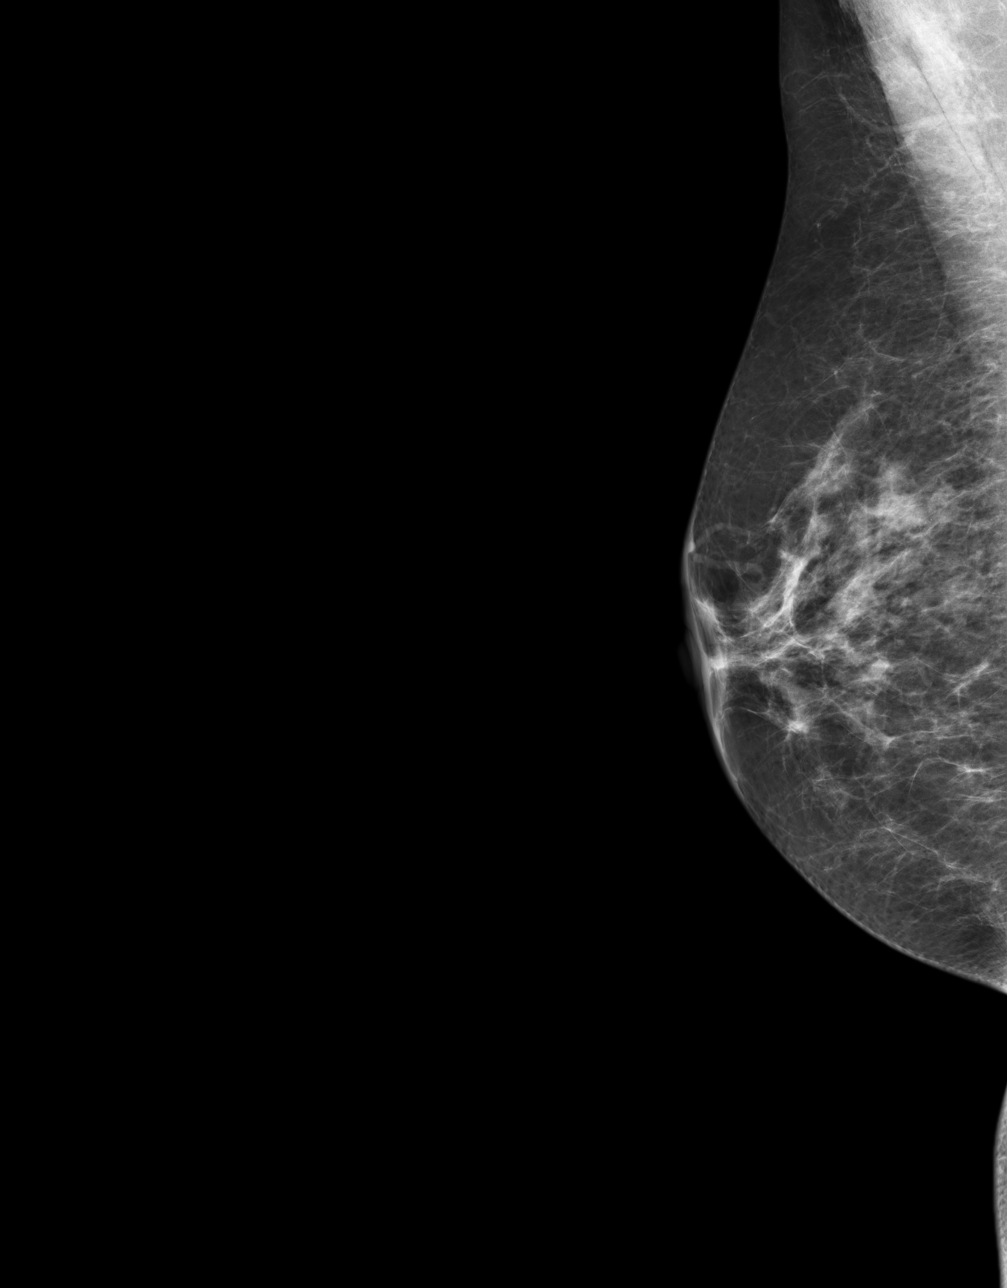
[im 4/4]
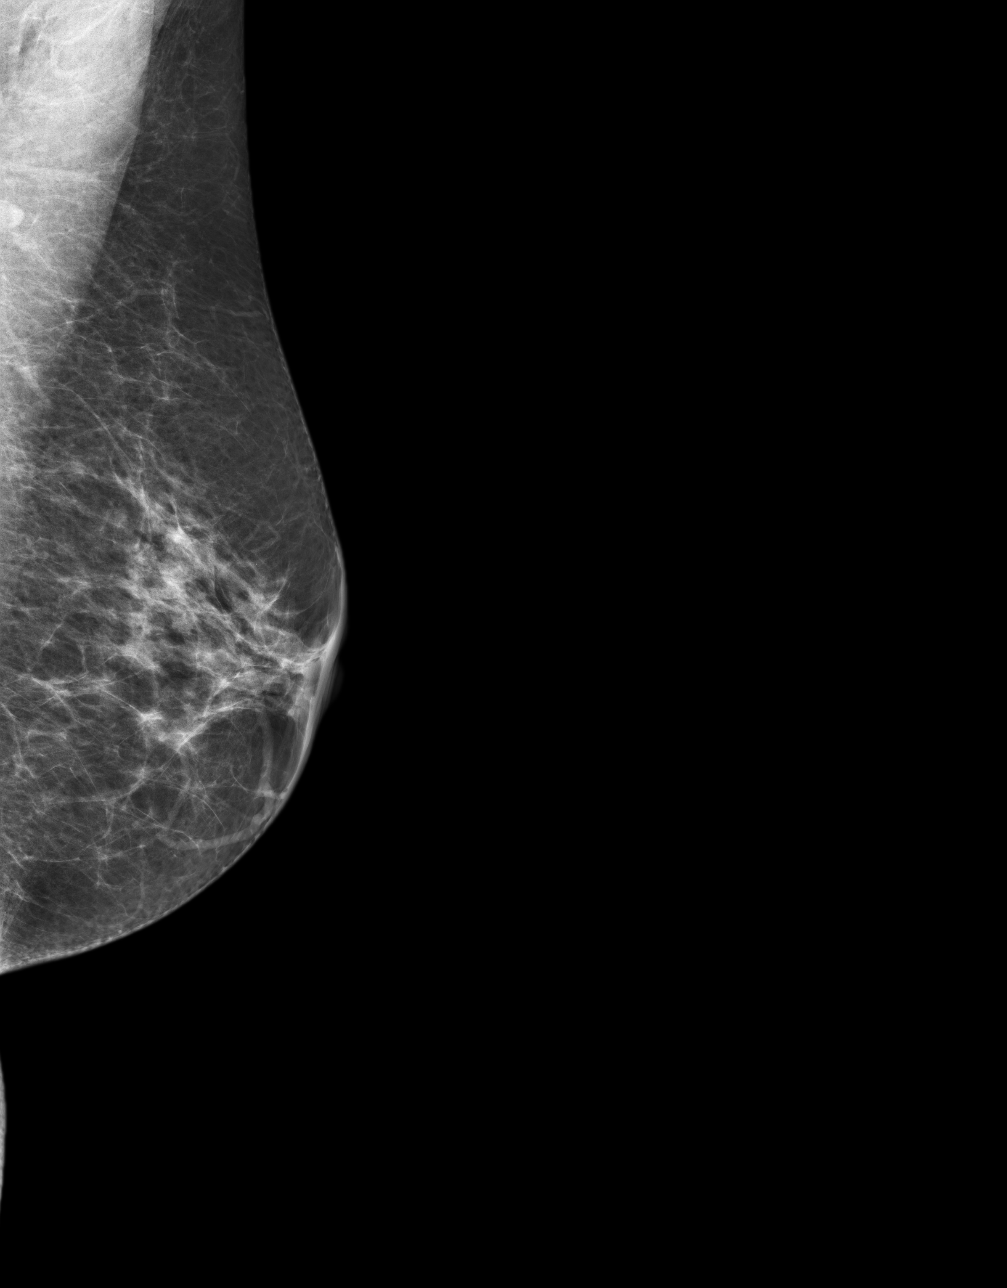

[4 of 4 positions shown; findings below may reference images not displayed]

ACR Breast Density Category c: The breast tissue is heterogeneously
dense, which may obscure small masses.
FINDINGS: In the right breast, a possible asymmetry warrants further
evaluation. In the left breast, no findings suspicious for
malignancy. Images were processed with CAD.
IMPRESSION: Further evaluation is suggested for possible asymmetry in the right
breast.

RECOMMENDATION:
Diagnostic mammogram and possibly ultrasound of the right breast.
(Code:S9-I-77H)

The patient will be contacted regarding the findings, and additional
imaging will be scheduled.

BI-RADS CATEGORY  0: Incomplete. Need additional imaging evaluation
and/or prior mammograms for comparison.

## 2017-08-09 ENCOUNTER — Other Ambulatory Visit: Payer: Self-pay | Admitting: Family Medicine

## 2017-08-09 NOTE — Telephone Encounter (Signed)
Request on Rx note to pharmacy, appt and labs; refills approved

## 2017-10-01 ENCOUNTER — Other Ambulatory Visit: Payer: Self-pay | Admitting: Family Medicine

## 2017-10-01 NOTE — Telephone Encounter (Signed)
Please read patient the Mychart note that I sent to her in January She is overdue for labs for this medicine The labs were due back in November We'll really want her to have those done ASAP I'll send just one week in, and that will give her several days to come by for her needed bloodwork Thank you

## 2017-10-02 NOTE — Telephone Encounter (Signed)
Left detailed voicemail

## 2017-10-05 ENCOUNTER — Other Ambulatory Visit: Payer: Self-pay | Admitting: Family Medicine

## 2017-10-08 ENCOUNTER — Other Ambulatory Visit: Payer: Self-pay | Admitting: Family Medicine

## 2017-10-12 ENCOUNTER — Other Ambulatory Visit: Payer: Self-pay

## 2017-10-12 NOTE — Patient Outreach (Signed)
[facility]) Care Management  10/12/2017  Allison Pham 1961/01/02 [id number]   Medication Adherence call to Allison Pham patient is showing past due under [facility] Ins.on Pravastatin 40 mg spoke with patient she said she still has medication that she pick up from [name] patient wants to see if doctor is going to  keep her on this medication patient knows she  is due for blood test.Allison Pham was not happy receiving a 2nd call from [facility] patient knows how to monitor her medications.  [place] [facility] Direct Dial [phone]  Fax [phone] [email or web address]

## 2017-10-19 ENCOUNTER — Encounter: Payer: Self-pay | Admitting: Family Medicine

## 2017-10-19 ENCOUNTER — Ambulatory Visit (INDEPENDENT_AMBULATORY_CARE_PROVIDER_SITE_OTHER): Payer: Medicare Other | Admitting: Family Medicine

## 2017-10-19 VITALS — BP 140/84 | HR 88 | Temp 97.9°F | Ht 61.0 in | Wt 157.0 lb

## 2017-10-19 DIAGNOSIS — D72820 Lymphocytosis (symptomatic): Secondary | ICD-10-CM | POA: Diagnosis not present

## 2017-10-19 DIAGNOSIS — Z5181 Encounter for therapeutic drug level monitoring: Secondary | ICD-10-CM

## 2017-10-19 DIAGNOSIS — I10 Essential (primary) hypertension: Secondary | ICD-10-CM | POA: Diagnosis not present

## 2017-10-19 DIAGNOSIS — E785 Hyperlipidemia, unspecified: Secondary | ICD-10-CM

## 2017-10-19 DIAGNOSIS — N898 Other specified noninflammatory disorders of vagina: Secondary | ICD-10-CM

## 2017-10-19 MED ORDER — METRONIDAZOLE 0.75 % VA GEL: 1.0000 | Freq: Two times a day (BID) | VAGINAL | 0 refills | Status: DC

## 2017-10-19 MED ORDER — LISINOPRIL 10 MG PO TABS: 10.0000 mg | ORAL_TABLET | Freq: Every day | ORAL | 0 refills | Status: DC

## 2017-10-19 NOTE — Assessment & Plan Note (Signed)
Check labs; try to limit saturated fats

## 2017-10-19 NOTE — Patient Instructions (Addendum)
Try to follow the DASH guidelines (DASH stands for Dietary Approaches to Stop Hypertension). Try to limit the sodium in your diet to no more than 1,500mg  of sodium per day. Certainly try to not exceed 2,000 mg per day at the very most. Do not add salt when cooking or at the table.  Check the sodium amount on labels when shopping, and choose items lower in sodium when given a choice. Avoid or limit foods that already contain a lot of sodium. Eat a diet rich in fruits and vegetables and whole grains, and try to lose weight if overweight or obese  Try Kegel exercises Use the new metronidazole gel   DASH Eating Plan DASH stands for "Dietary Approaches to Stop Hypertension." The DASH eating plan is a healthy eating plan that has been shown to reduce high blood pressure (hypertension). It may also reduce your risk for type 2 diabetes, heart disease, and stroke. The DASH eating plan may also help with weight loss. What are tips for following this plan? General guidelines  Avoid eating more than 2,300 mg (milligrams) of salt (sodium) a day. If you have hypertension, you may need to reduce your sodium intake to 1,500 mg a day.  Limit alcohol intake to no more than 1 drink a day for nonpregnant women and 2 drinks a day for men. One drink equals 12 oz of beer, 5 oz of wine, or 1 oz of hard liquor.  Work with your health care provider to maintain a healthy body weight or to lose weight. Ask what an ideal weight is for you.  Get at least 30 minutes of exercise that causes your heart to beat faster (aerobic exercise) most days of the week. Activities may include walking, swimming, or biking.  Work with your health care provider or diet and nutrition specialist (dietitian) to adjust your eating plan to your individual calorie needs. Reading food labels  Check food labels for the amount of sodium per serving. Choose foods with less than 5 percent of the Daily Value of sodium. Generally, foods with less than  300 mg of sodium per serving fit into this eating plan.  To find whole grains, look for the word "whole" as the first word in the ingredient list. Shopping  Buy products labeled as "low-sodium" or "no salt added."  Buy fresh foods. Avoid canned foods and premade or frozen meals. Cooking  Avoid adding salt when cooking. Use salt-free seasonings or herbs instead of table salt or sea salt. Check with your health care provider or pharmacist before using salt substitutes.  Do not fry foods. Cook foods using healthy methods such as baking, boiling, grilling, and broiling instead.  Cook with heart-healthy oils, such as olive, canola, soybean, or sunflower oil. Meal planning   Eat a balanced diet that includes: ? 5 or more servings of fruits and vegetables each day. At each meal, try to fill half of your plate with fruits and vegetables. ? Up to 6-8 servings of whole grains each day. ? Less than 6 oz of lean meat, poultry, or fish each day. A 3-oz serving of meat is about the same size as a deck of cards. One egg equals 1 oz. ? 2 servings of low-fat dairy each day. ? A serving of nuts, seeds, or beans 5 times each week. ? Heart-healthy fats. Healthy fats called Omega-3 fatty acids are found in foods such as flaxseeds and coldwater fish, like sardines, salmon, and mackerel.  Limit how much you eat of  the following: ? Canned or prepackaged foods. ? Food that is high in trans fat, such as fried foods. ? Food that is high in saturated fat, such as fatty meat. ? Sweets, desserts, sugary drinks, and other foods with added sugar. ? Full-fat dairy products.  Do not salt foods before eating.  Try to eat at least 2 vegetarian meals each week.  Eat more home-cooked food and less restaurant, buffet, and fast food.  When eating at a restaurant, ask that your food be prepared with less salt or no salt, if possible. What foods are recommended? The items listed may not be a complete list. Talk with  your dietitian about what dietary choices are best for you. Grains Whole-grain or whole-wheat bread. Whole-grain or whole-wheat pasta. Brown rice. [place]. Bulgur. Whole-grain and low-sodium cereals. Pita bread. Low-fat, low-sodium crackers. Whole-wheat flour tortillas. Vegetables Fresh or frozen vegetables (raw, steamed, roasted, or grilled). Low-sodium or reduced-sodium tomato and vegetable juice. Low-sodium or reduced-sodium tomato sauce and tomato paste. Low-sodium or reduced-sodium canned vegetables. Fruits All fresh, dried, or frozen fruit. Canned fruit in natural juice (without added sugar). Meat and other protein foods Skinless chicken or [place]. Ground chicken or [place]. Pork with fat trimmed off. Fish and seafood. Egg whites. Dried beans, peas, or lentils. Unsalted nuts, nut butters, and seeds. Unsalted canned beans. Lean cuts of beef with fat trimmed off. Low-sodium, lean deli meat. Dairy Low-fat (1%) or fat-free (skim) milk. Fat-free, low-fat, or reduced-fat cheeses. Nonfat, low-sodium ricotta or cottage cheese. Low-fat or nonfat yogurt. Low-fat, low-sodium cheese. Fats and oils Soft margarine without trans fats. Vegetable oil. Low-fat, reduced-fat, or light mayonnaise and salad dressings (reduced-sodium). Canola, safflower, olive, soybean, and sunflower oils. Avocado. Seasoning and other foods Herbs. Spices. Seasoning mixes without salt. Unsalted popcorn and pretzels. Fat-free sweets. What foods are not recommended? The items listed may not be a complete list. Talk with your dietitian about what dietary choices are best for you. Grains Baked goods made with fat, such as croissants, muffins, or some breads. Dry pasta or rice meal packs. Vegetables Creamed or fried vegetables. Vegetables in a cheese sauce. Regular canned vegetables (not low-sodium or reduced-sodium). Regular canned tomato sauce and paste (not low-sodium or reduced-sodium). Regular tomato and vegetable juice  (not low-sodium or reduced-sodium). Angie Fava. Olives. Fruits Canned fruit in a light or heavy syrup. Fried fruit. Fruit in cream or butter sauce. Meat and other protein foods Fatty cuts of meat. Ribs. Fried meat. Berniece Salines. Sausage. Bologna and other processed lunch meats. Salami. Fatback. Hotdogs. Bratwurst. Salted nuts and seeds. Canned beans with added salt. Canned or smoked fish. Whole eggs or egg yolks. Chicken or [place] with skin. Dairy Whole or 2% milk, cream, and half-and-half. Whole or full-fat cream cheese. Whole-fat or sweetened yogurt. Full-fat cheese. Nondairy creamers. Whipped toppings. Processed cheese and cheese spreads. Fats and oils Butter. Stick margarine. Lard. Shortening. Ghee. Bacon fat. Tropical oils, such as coconut, palm kernel, or palm oil. Seasoning and other foods Salted popcorn and pretzels. Onion salt, garlic salt, seasoned salt, table salt, and sea salt. Worcestershire sauce. Tartar sauce. Barbecue sauce. Teriyaki sauce. Soy sauce, including reduced-sodium. Steak sauce. Canned and packaged gravies. Fish sauce. Oyster sauce. Cocktail sauce. Horseradish that you find on the shelf. Ketchup. Mustard. Meat flavorings and tenderizers. Bouillon cubes. Hot sauce and Tabasco sauce. Premade or packaged marinades. Premade or packaged taco seasonings. Relishes. Regular salad dressings. Where to find more information:  National Heart, Lung, and [place]: https://[place]-e[place].com/  American Heart Association:  www.heart.org Summary  The DASH eating plan is a healthy eating plan that has been shown to reduce high blood pressure (hypertension). It may also reduce your risk for type 2 diabetes, heart disease, and stroke.  With the DASH eating plan, you should limit salt (sodium) intake to 2,300 mg a day. If you have hypertension, you may need to reduce your sodium intake to 1,500 mg a day.  When on the DASH eating plan, aim to eat more fresh fruits and vegetables, whole grains, lean  proteins, low-fat dairy, and heart-healthy fats.  Work with your health care provider or diet and nutrition specialist (dietitian) to adjust your eating plan to your individual calorie needs. This information is not intended to replace advice given to you by your health care provider. Make sure you discuss any questions you have with your health care provider. Document Released: 06/01/2011 Document Revised: 06/05/2016 Document Reviewed: 06/05/2016 Elsevier Interactive Patient Education  [employer].

## 2017-10-19 NOTE — Assessment & Plan Note (Signed)
Increase ACE-I, return in 2 weeks with CMA for BP recheck and creatinine and K+; try DASH guidelines

## 2017-10-19 NOTE — Assessment & Plan Note (Signed)
Check CBC 

## 2017-10-19 NOTE — Progress Notes (Signed)
BP 140/84 (BP Location: Right Arm, Patient Position: Sitting, Cuff Size: Large)   Pulse 88   Temp 97.9 F (36.6 C) (Oral)   Ht 5\' 1"  (1.549 m)   Wt 157 lb (71.2 kg)   SpO2 98%   BMI 29.66 kg/m    Subjective:    Patient ID: Allison Pham, female    DOB: 06-Mar-1961, 57 y.o.   MRN: [id number]  HPI: Allison Pham is a 57 y.o. female  Chief Complaint  Patient presents with  . Vaginitis    HPI Patient is here for follow-up Dry, burning itching in the vaginal region; tried monistat last week, no help; no discharge  She has high cholesterol; taking statin; not much bacon or sausage; just had Easter, so ate a lot of ham Lab Results  Component Value Date   CHOL 294 (H) 03/21/2017   HDL 49 (L) 03/21/2017   LDLCALC 214 (H) 03/21/2017   TRIG 149 03/21/2017   CHOLHDL 6.0 (H) 03/21/2017   Anxiety, treated with buspirone; she says it working pretty well; does not want dose increased; asked if maybe it would let her go to somewhere she can get massage for relaxation; she will check her insurance and let me know  HTN; runs in the family; on lisinopril 5 mg daily; usually takes it, rare missed doses; did eat ham over Easter; no decongestants; no chest pain; does add salt when cooking; does at a little at the table  Allergies; controlled with medicine  GERD; not having to take medicine  Depression screen [facility] 2/9 10/19/2017 03/21/2017 05/25/2016 05/15/2016 10/29/2015  Decreased Interest 0 0 0 0 0  Down, Depressed, Hopeless 0 0 0 1 0  PHQ - 2 Score 0 0 0 1 0  Altered sleeping - - - - -  Tired, decreased energy - - - - -  Change in appetite - - - - -  Feeling bad or failure about yourself  - - - - -  Trouble concentrating - - - - -  Moving slowly or fidgety/restless - - - - -  Suicidal thoughts - - - - -  PHQ-9 Score - - - - -  Difficult doing work/chores - - - - -    Relevant past medical, surgical, family and social history reviewed Past Medical History:  Diagnosis Date  .  Anxiety   . GERD (gastroesophageal reflux disease)   . Hypercholesteremia   . Hypertension   . Tick bite   . Vertigo   . Wears dentures    partial upper   Past Surgical History:  Procedure Laterality Date  . ABDOMINAL HYSTERECTOMY     Partial  . COLONOSCOPY WITH PROPOFOL N/A 05/10/2017   Procedure: COLONOSCOPY WITH PROPOFOL;  Surgeon: [name], MD;  Location: [place];  Service: Endoscopy;  Laterality: N/A;  . HEMORRHOID SURGERY    . POLYPECTOMY N/A 05/10/2017   Procedure: POLYPECTOMY;  Surgeon: [name], MD;  Location: [place];  Service: Endoscopy;  Laterality: N/A;   Family History  Problem Relation Age of Onset  . Cancer Mother        Colon  . Hypertension Mother   . Hypertension Father   . Heart attack Father   . Heart disease Father   . Hypertension Sister   . COPD Sister   . Hypertension Brother   . Heart disease Brother   . Cancer Maternal Aunt   . Cancer Maternal Uncle   . Diabetes Neg Hx   .  Stroke Neg Hx   . Breast cancer Neg Hx    Social History   Tobacco Use  . Smoking status: Former Smoker    Packs/day: 1.00    Types: Cigarettes    Last attempt to quit: 09/13/2015    Years since quitting: 2.1  . Smokeless tobacco: Never Used  Substance Use Topics  . Alcohol use: Yes    Alcohol/week: 0.0 oz    Comment: rare - holidays  . Drug use: No    Interim medical history since last visit reviewed. Allergies and medications reviewed  Review of Systems Per HPI unless specifically indicated above     Objective:    BP 140/84 (BP Location: Right Arm, Patient Position: Sitting, Cuff Size: Large)   Pulse 88   Temp 97.9 F (36.6 C) (Oral)   Ht 5\' 1"  (1.549 m)   Wt 157 lb (71.2 kg)   SpO2 98%   BMI 29.66 kg/m   Wt Readings from Last 3 Encounters:  10/19/17 157 lb (71.2 kg)  05/10/17 154 lb (69.9 kg)  03/21/17 155 lb 4.8 oz (70.4 kg)    Physical Exam  Constitutional: She appears well-developed and well-nourished. No  distress.  HENT:  Head: Normocephalic and atraumatic.  Eyes: EOM are normal. No scleral icterus.  Neck: No thyromegaly present.  Cardiovascular: Normal rate, regular rhythm and normal heart sounds.  No murmur heard. Pulmonary/Chest: Effort normal and breath sounds normal. No respiratory distress. She has no wheezes.  Abdominal: Soft. Bowel sounds are normal. She exhibits no distension.  Genitourinary: Uterus is not tender. Cervix exhibits no friability. Right adnexum displays no mass, no tenderness and no fullness. Left adnexum displays no mass, no tenderness and no fullness. Vaginal discharge (fishy-smelling watery to whitish discharge; no clumps) found.  Musculoskeletal: Normal range of motion. She exhibits no edema.  Neurological: She is alert.  Skin: Skin is warm and dry. She is not diaphoretic. No pallor.  Psychiatric: She has a normal mood and affect. Her behavior is normal. Judgment and thought content normal.    Results for orders placed or performed in visit on 03/21/17  CBC with Differential/Platelet  Result Value Ref Range   WBC 6.6 3.8 - 10.8 Thousand/uL   RBC 5.45 (H) 3.80 - 5.10 Million/uL   Hemoglobin 15.3 11.7 - 15.5 g/dL   HCT 45.0 35.0 - 45.0 %   MCV 82.6 80.0 - 100.0 fL   MCH 28.1 27.0 - 33.0 pg   MCHC 34.0 32.0 - 36.0 g/dL   RDW 13.2 11.0 - 15.0 %   Platelets 228 140 - 400 Thousand/uL   MPV 11.0 7.5 - 12.5 fL   Neutro Abs 2,950 1,500 - 7,800 cells/uL   Lymphs Abs 2,785 850 - 3,900 cells/uL   WBC mixed population 594 200 - 950 cells/uL   Eosinophils Absolute 211 15 - 500 cells/uL   Basophils Absolute 59 0 - 200 cells/uL   Neutrophils Relative % 44.7 %   Total Lymphocyte 42.2 %   Monocytes Relative 9.0 %   Eosinophils Relative 3.2 %   Basophils Relative 0.9 %  COMPLETE METABOLIC PANEL WITH GFR  Result Value Ref Range   Glucose, Bld 84 65 - 139 mg/dL   BUN 18 7 - 25 mg/dL   Creat 0.85 0.50 - 1.05 mg/dL   GFR, Est Non African American 76 > OR = 60  mL/min/1.60m2   GFR, Est African American 88 > OR = 60 mL/min/1.42m2   BUN/Creatinine Ratio NOT APPLICABLE 6 -  22 (calc)   Sodium 139 135 - 146 mmol/L   Potassium 4.5 3.5 - 5.3 mmol/L   Chloride 105 98 - 110 mmol/L   CO2 28 20 - 32 mmol/L   Calcium 9.6 8.6 - 10.4 mg/dL   Total Protein 6.8 6.1 - 8.1 g/dL   Albumin 4.3 3.6 - 5.1 g/dL   Globulin 2.5 1.9 - 3.7 g/dL (calc)   AG Ratio 1.7 1.0 - 2.5 (calc)   Total Bilirubin 0.6 0.2 - 1.2 mg/dL   Alkaline phosphatase (APISO) 95 33 - 130 U/L   AST 20 10 - 35 U/L   ALT 23 6 - 29 U/L  Lipid panel  Result Value Ref Range   Cholesterol 294 (H) <200 mg/dL   HDL 49 (L) >50 mg/dL   Triglycerides 149 <150 mg/dL   LDL Cholesterol (Calc) 214 (H) mg/dL (calc)   Total CHOL/HDL Ratio 6.0 (H) <5.0 (calc)   Non-HDL Cholesterol (Calc) 245 (H) <130 mg/dL (calc)      Assessment & Plan:   Problem List Items Addressed This Visit      Cardiovascular and Mediastinum   Essential hypertension, benign    Increase ACE-I, return in 2 weeks with CMA for BP recheck and creatinine and K+; try DASH guidelines      Relevant Medications   lisinopril (PRINIVIL,ZESTRIL) 10 MG tablet     Other   Medication monitoring encounter   Relevant Orders   COMPLETE METABOLIC PANEL WITH GFR   Lymphocytosis    Check CBC      Relevant Orders   CBC with Differential/Platelet   Dyslipidemia    Check labs; try to limit saturated fats      Relevant Orders   Lipid panel    Other Visit Diagnoses    Vaginal discharge    -  Primary   Relevant Orders   WET PREP BY MOLECULAR PROBE       Follow up plan: Return in about 2 weeks (around 11/02/2017) for blood pressure recheck with CMA and labs.  An after-visit summary was printed and given to the patient at [place] [facility].  Please see the patient instructions which may contain other information and recommendations beyond what is mentioned above in the assessment and plan.  Meds ordered this encounter  Medications  .  lisinopril (PRINIVIL,ZESTRIL) 10 MG tablet    Sig: Take 1 tablet (10 mg total) by mouth daily.    Dispense:  90 tablet    Refill:  0    Increasing dose from 5 to 10 mg  . metroNIDAZOLE (METROGEL VAGINAL) 0.75 % vaginal gel    Sig: Place 1 Applicatorful vaginally 2 (two) times daily.    Dispense:  70 g    Refill:  0    Orders Placed This Encounter  Procedures  . WET PREP BY MOLECULAR PROBE  . CBC with Differential/Platelet  . COMPLETE METABOLIC PANEL WITH GFR  . Lipid panel

## 2017-10-20 LAB — WET PREP BY MOLECULAR PROBE
Candida species: NOT DETECTED
MICRO NUMBER: 90514967
SPECIMEN QUALITY: ADEQUATE
TRICHOMONAS VAG: NOT DETECTED

## 2017-10-22 IMAGING — US MM DIGITAL DIAGNOSTIC UNILAT*R* W/ TOMO W/ CAD
1 series · 6 of 6 positions shown · non-contrast
Comparison: 11/01/2015 and earlier

CLINICAL DATA: Patient returns after screening study for evaluation
of possible right breast asymmetry.

EXAM:
2D DIGITAL DIAGNOSTIC RIGHT MAMMOGRAM WITH ADJUNCT TOMO
ULTRASOUND RIGHT BREAST

[Series 1: mm digital diagnostic unilat*right* w/ tomo w/ cad · 0.07mm/px · 6 of 6 slices shown]
[im 1/6]
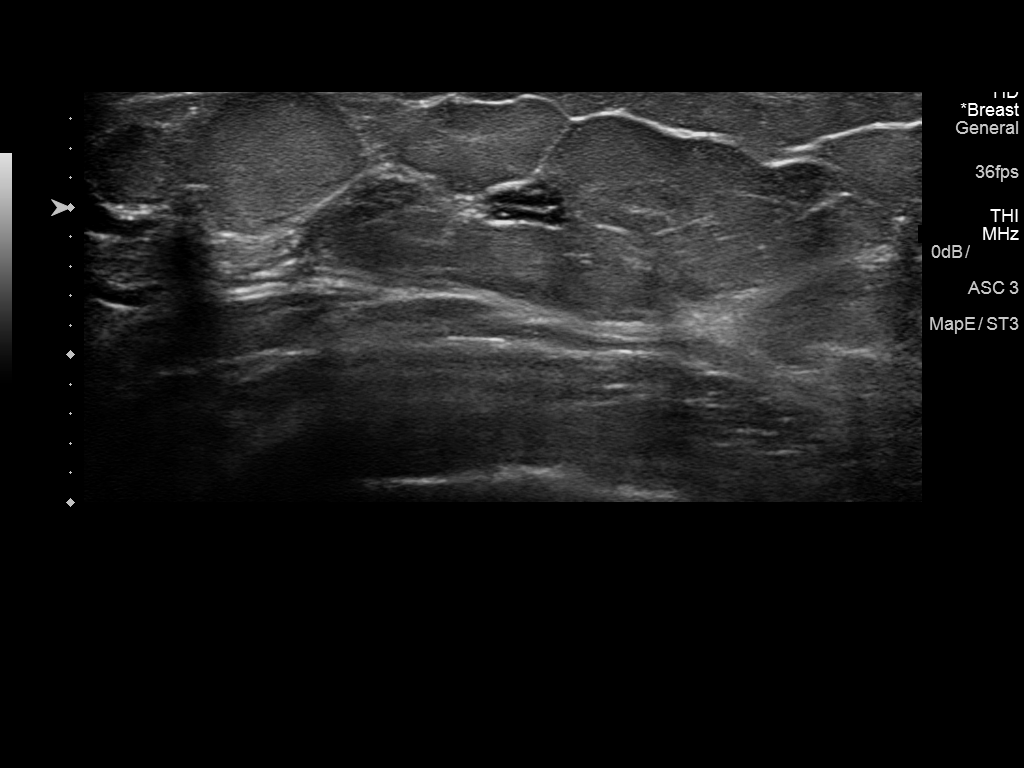
[im 2/6]
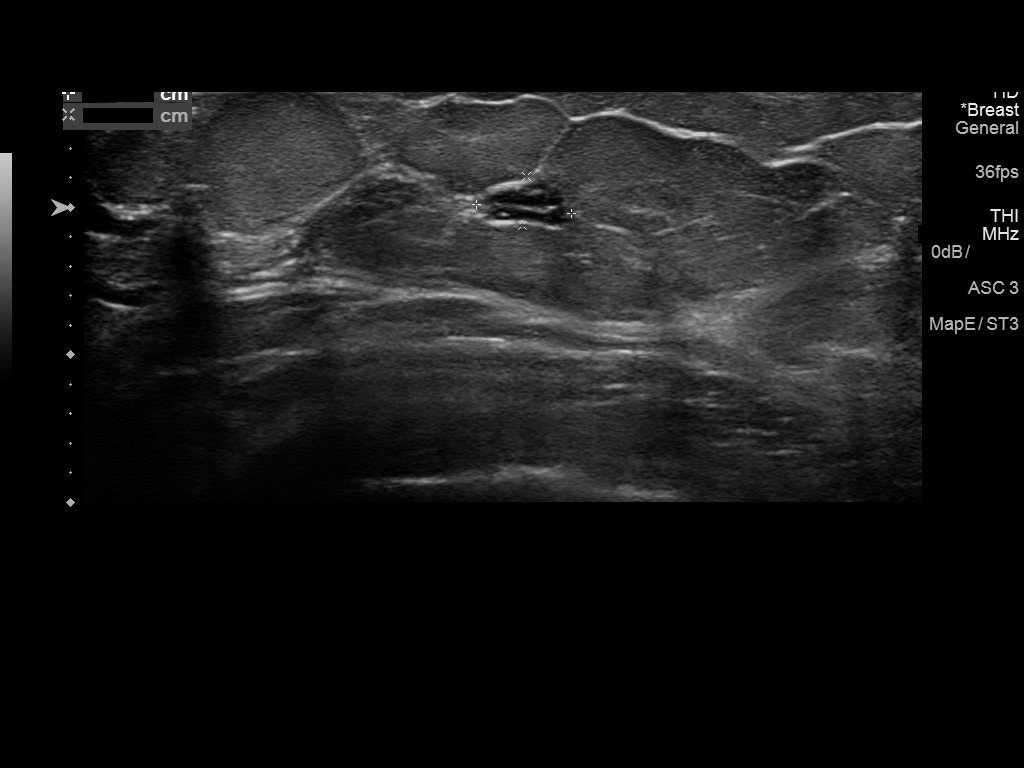
[im 3/6]
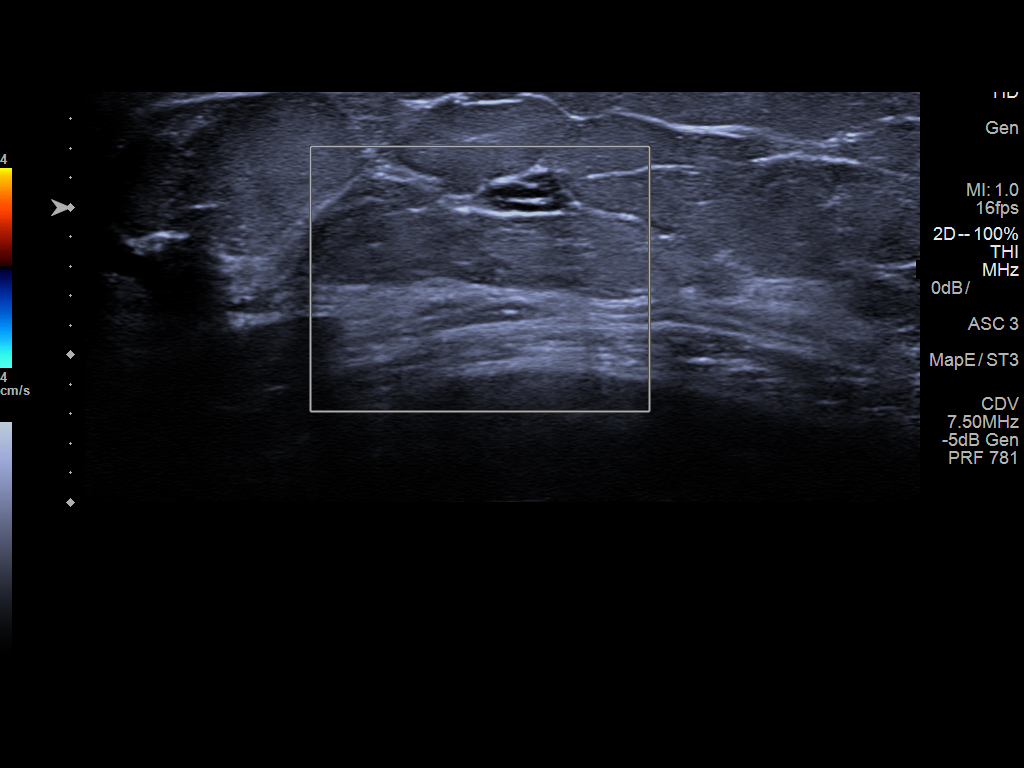
[im 4/6]
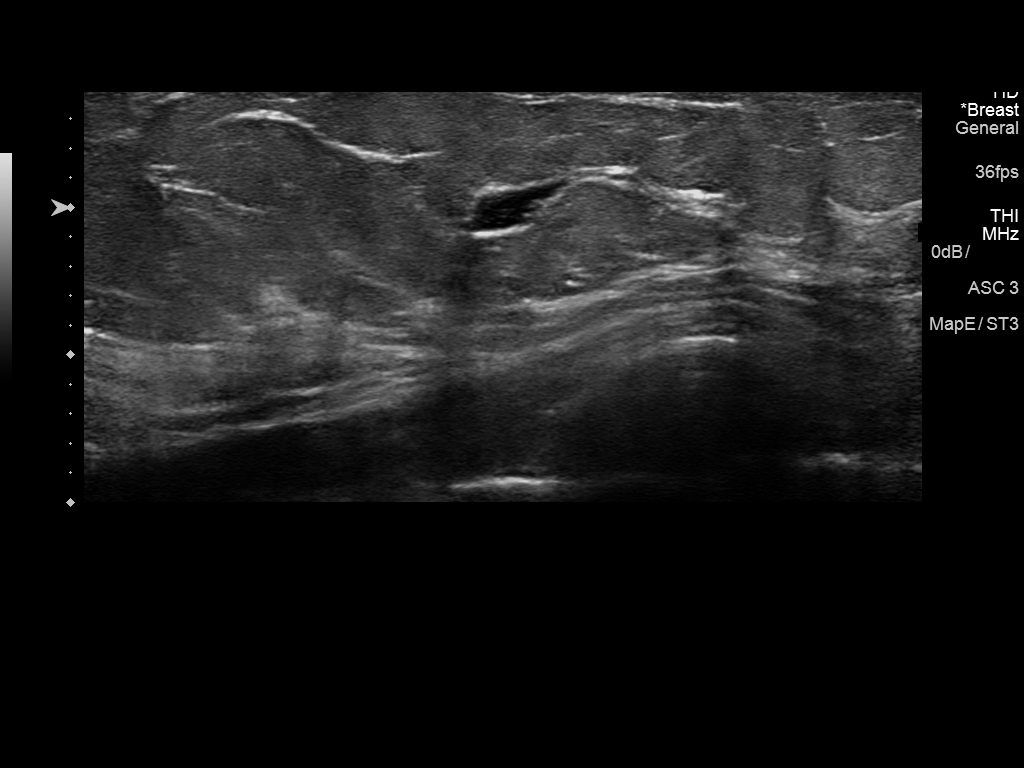
[im 5/6]
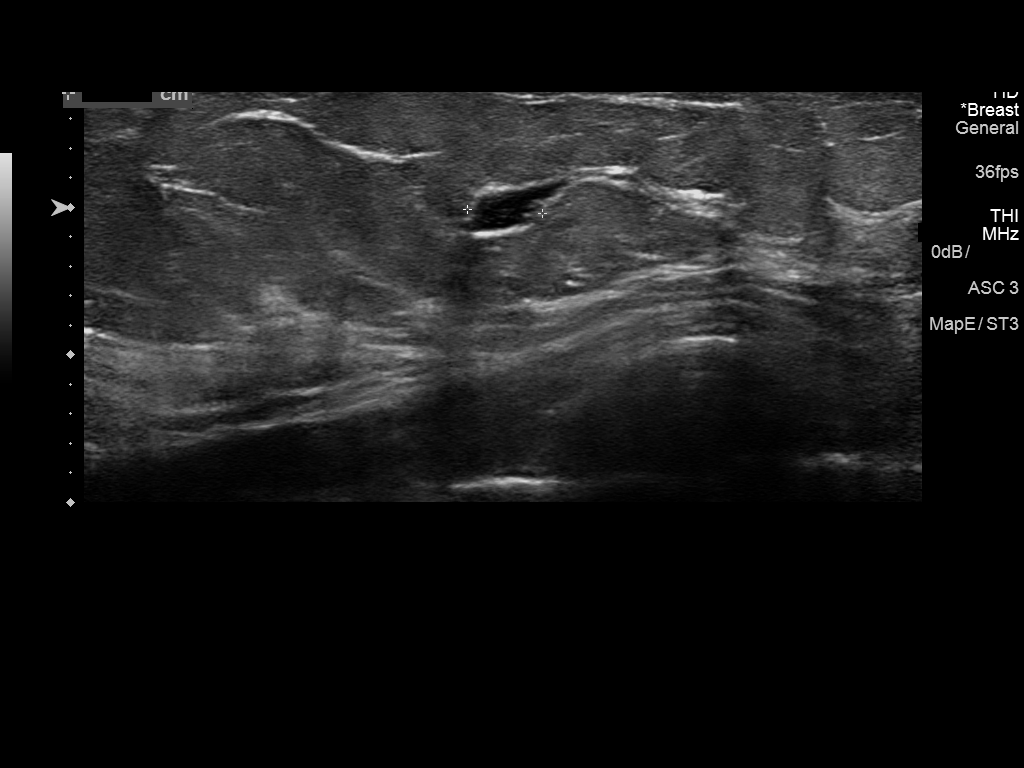
[im 6/6]
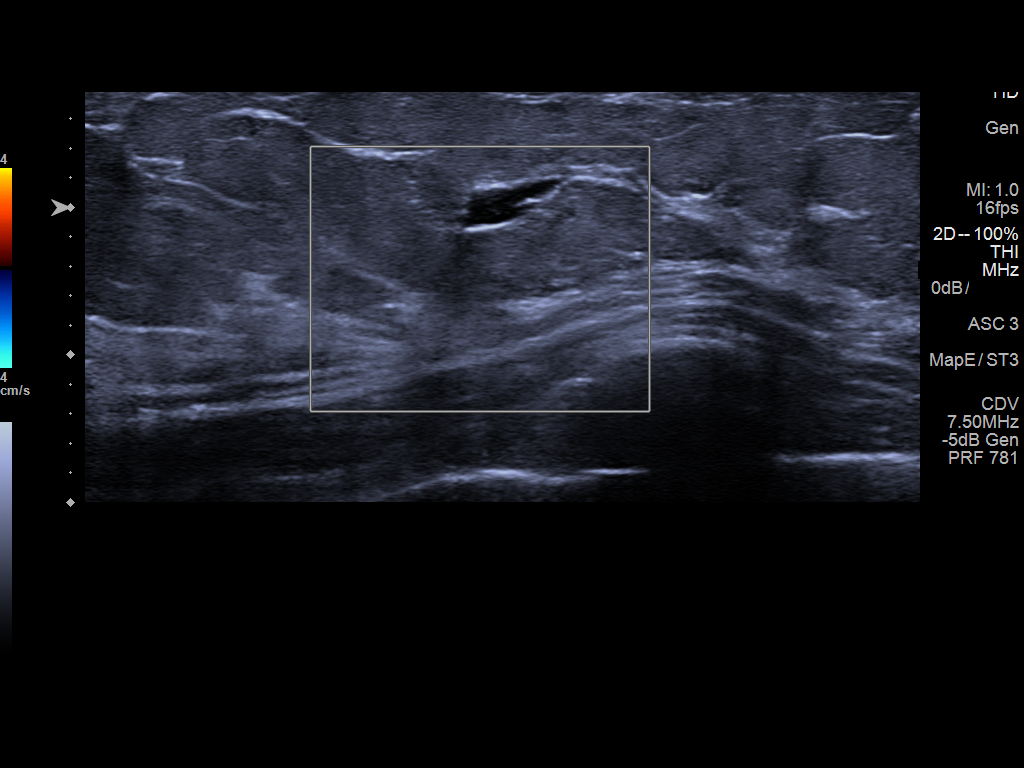

[6 of 6 positions shown; findings below may reference images not displayed]

ACR Breast Density Category b: There are scattered areas of
fibroglandular density.
FINDINGS: Additional views are performed, confirming presence of a low density
mass in the medial aspect of the right breast further evaluated with
ultrasound.

On physical exam, I palpate no abnormality in the medial aspect of
the right breast.

Targeted ultrasound is performed, showing a small hypoechoic mass in
the 3 o'clock location of the right breast 3 cm from the nipple.
Internal septations are noted. Findings are consistent with
fibrocystic changes and followup is recommended to document
stability.
IMPRESSION: Probable fibrocystic change in the medial aspect of the right
breast.

RECOMMENDATION:
Right breast ultrasound suggested in 6 months to assess stability.

I have discussed the findings and recommendations with the patient.
Results were also provided in writing at the conclusion of the
visit. If applicable, a reminder letter will be sent to the patient
regarding the next appointment.

BI-RADS CATEGORY  3: Probably benign.

## 2017-11-02 ENCOUNTER — Ambulatory Visit: Payer: Medicare Other

## 2017-11-02 VITALS — BP 136/74 | HR 78

## 2017-11-02 DIAGNOSIS — E785 Hyperlipidemia, unspecified: Secondary | ICD-10-CM | POA: Diagnosis not present

## 2017-11-02 DIAGNOSIS — D72820 Lymphocytosis (symptomatic): Secondary | ICD-10-CM

## 2017-11-02 DIAGNOSIS — Z5181 Encounter for therapeutic drug level monitoring: Secondary | ICD-10-CM

## 2017-11-02 DIAGNOSIS — I10 Essential (primary) hypertension: Secondary | ICD-10-CM

## 2017-11-02 NOTE — Patient Instructions (Signed)
DASH Eating Plan DASH stands for "Dietary Approaches to Stop Hypertension." The DASH eating plan is a healthy eating plan that has been shown to reduce high blood pressure (hypertension). It may also reduce your risk for type 2 diabetes, heart disease, and stroke. The DASH eating plan may also help with weight loss. What are tips for following this plan? General guidelines  Avoid eating more than 2,300 mg (milligrams) of salt (sodium) a day. If you have hypertension, you may need to reduce your sodium intake to 1,500 mg a day.  Limit alcohol intake to no more than 1 drink a day for nonpregnant women and 2 drinks a day for men. One drink equals 12 oz of beer, 5 oz of wine, or 1 oz of hard liquor.  Work with your health care provider to maintain a healthy body weight or to lose weight. Ask what an ideal weight is for you.  Get at least 30 minutes of exercise that causes your heart to beat faster (aerobic exercise) most days of the week. Activities may include walking, swimming, or biking.  Work with your health care provider or diet and nutrition specialist (dietitian) to adjust your eating plan to your individual calorie needs. Reading food labels  Check food labels for the amount of sodium per serving. Choose foods with less than 5 percent of the Daily Value of sodium. Generally, foods with less than 300 mg of sodium per serving fit into this eating plan.  To find whole grains, look for the word "whole" as the first word in the ingredient list. Shopping  Buy products labeled as "low-sodium" or "no salt added."  Buy fresh foods. Avoid canned foods and premade or frozen meals. Cooking  Avoid adding salt when cooking. Use salt-free seasonings or herbs instead of table salt or sea salt. Check with your health care provider or pharmacist before using salt substitutes.  Do not fry foods. Cook foods using healthy methods such as baking, boiling, grilling, and broiling instead.  Cook with  heart-healthy oils, such as olive, canola, soybean, or sunflower oil. Meal planning   Eat a balanced diet that includes: ? 5 or more servings of fruits and vegetables each day. At each meal, try to fill half of your plate with fruits and vegetables. ? Up to 6-8 servings of whole grains each day. ? Less than 6 oz of lean meat, poultry, or fish each day. A 3-oz serving of meat is about the same size as a deck of cards. One egg equals 1 oz. ? 2 servings of low-fat dairy each day. ? A serving of nuts, seeds, or beans 5 times each week. ? Heart-healthy fats. Healthy fats called Omega-3 fatty acids are found in foods such as flaxseeds and coldwater fish, like sardines, salmon, and mackerel.  Limit how much you eat of the following: ? Canned or prepackaged foods. ? Food that is high in trans fat, such as fried foods. ? Food that is high in saturated fat, such as fatty meat. ? Sweets, desserts, sugary drinks, and other foods with added sugar. ? Full-fat dairy products.  Do not salt foods before eating.  Try to eat at least 2 vegetarian meals each week.  Eat more home-cooked food and less restaurant, buffet, and fast food.  When eating at a restaurant, ask that your food be prepared with less salt or no salt, if possible. What foods are recommended? The items listed may not be a complete list. Talk with your dietitian about what   dietary choices are best for you. Grains Whole-grain or whole-wheat bread. Whole-grain or whole-wheat pasta. Brown rice. Oatmeal. Quinoa. Bulgur. Whole-grain and low-sodium cereals. Pita bread. Low-fat, low-sodium crackers. Whole-wheat flour tortillas. Vegetables Fresh or frozen vegetables (raw, steamed, roasted, or grilled). Low-sodium or reduced-sodium tomato and vegetable juice. Low-sodium or reduced-sodium tomato sauce and tomato paste. Low-sodium or reduced-sodium canned vegetables. Fruits All fresh, dried, or frozen fruit. Canned fruit in natural juice (without  added sugar). Meat and other protein foods Skinless chicken or turkey. Ground chicken or turkey. Pork with fat trimmed off. Fish and seafood. Egg whites. Dried beans, peas, or lentils. Unsalted nuts, nut butters, and seeds. Unsalted canned beans. Lean cuts of beef with fat trimmed off. Low-sodium, lean deli meat. Dairy Low-fat (1%) or fat-free (skim) milk. Fat-free, low-fat, or reduced-fat cheeses. Nonfat, low-sodium ricotta or cottage cheese. Low-fat or nonfat yogurt. Low-fat, low-sodium cheese. Fats and oils Soft margarine without trans fats. Vegetable oil. Low-fat, reduced-fat, or light mayonnaise and salad dressings (reduced-sodium). Canola, safflower, olive, soybean, and sunflower oils. Avocado. Seasoning and other foods Herbs. Spices. Seasoning mixes without salt. Unsalted popcorn and pretzels. Fat-free sweets. What foods are not recommended? The items listed may not be a complete list. Talk with your dietitian about what dietary choices are best for you. Grains Baked goods made with fat, such as croissants, muffins, or some breads. Dry pasta or rice meal packs. Vegetables Creamed or fried vegetables. Vegetables in a cheese sauce. Regular canned vegetables (not low-sodium or reduced-sodium). Regular canned tomato sauce and paste (not low-sodium or reduced-sodium). Regular tomato and vegetable juice (not low-sodium or reduced-sodium). Pickles. Olives. Fruits Canned fruit in a light or heavy syrup. Fried fruit. Fruit in cream or butter sauce. Meat and other protein foods Fatty cuts of meat. Ribs. Fried meat. Bacon. Sausage. Bologna and other processed lunch meats. Salami. Fatback. Hotdogs. Bratwurst. Salted nuts and seeds. Canned beans with added salt. Canned or smoked fish. Whole eggs or egg yolks. Chicken or turkey with skin. Dairy Whole or 2% milk, cream, and half-and-half. Whole or full-fat cream cheese. Whole-fat or sweetened yogurt. Full-fat cheese. Nondairy creamers. Whipped toppings.  Processed cheese and cheese spreads. Fats and oils Butter. Stick margarine. Lard. Shortening. Ghee. Bacon fat. Tropical oils, such as coconut, palm kernel, or palm oil. Seasoning and other foods Salted popcorn and pretzels. Onion salt, garlic salt, seasoned salt, table salt, and sea salt. Worcestershire sauce. Tartar sauce. Barbecue sauce. Teriyaki sauce. Soy sauce, including reduced-sodium. Steak sauce. Canned and packaged gravies. Fish sauce. Oyster sauce. Cocktail sauce. Horseradish that you find on the shelf. Ketchup. Mustard. Meat flavorings and tenderizers. Bouillon cubes. Hot sauce and Tabasco sauce. Premade or packaged marinades. Premade or packaged taco seasonings. Relishes. Regular salad dressings. Where to find more information:  National Heart, Lung, and Blood Institute: www.nhlbi.nih.gov  American Heart Association: www.heart.org Summary  The DASH eating plan is a healthy eating plan that has been shown to reduce high blood pressure (hypertension). It may also reduce your risk for type 2 diabetes, heart disease, and stroke.  With the DASH eating plan, you should limit salt (sodium) intake to 2,300 mg a day. If you have hypertension, you may need to reduce your sodium intake to 1,500 mg a day.  When on the DASH eating plan, aim to eat more fresh fruits and vegetables, whole grains, lean proteins, low-fat dairy, and heart-healthy fats.  Work with your health care provider or diet and nutrition specialist (dietitian) to adjust your eating plan to your individual   calorie needs. This information is not intended to replace advice given to you by your health care provider. Make sure you discuss any questions you have with your health care provider. Document Released: 06/01/2011 Document Revised: 06/05/2016 Document Reviewed: 06/05/2016 Elsevier Interactive Patient Education  2018 Elsevier Inc.  

## 2017-11-03 LAB — CBC WITH DIFFERENTIAL/PLATELET
BASOS PCT: 0.9 %
Basophils Absolute: 59 cells/uL (ref 0–200)
EOS ABS: 139 {cells}/uL (ref 15–500)
Eosinophils Relative: 2.1 %
HCT: 43.4 % (ref 35.0–45.0)
HEMOGLOBIN: 14.9 g/dL (ref 11.7–15.5)
LYMPHS ABS: 2818 {cells}/uL (ref 850–3900)
MCH: 28 pg (ref 27.0–33.0)
MCHC: 34.3 g/dL (ref 32.0–36.0)
MCV: 81.4 fL (ref 80.0–100.0)
MPV: 10.7 fL (ref 7.5–12.5)
Monocytes Relative: 9.1 %
NEUTROS ABS: 2983 {cells}/uL (ref 1500–7800)
Neutrophils Relative %: 45.2 %
Platelets: 233 10*3/uL (ref 140–400)
RBC: 5.33 10*6/uL — AB (ref 3.80–5.10)
RDW: 13.2 % (ref 11.0–15.0)
TOTAL LYMPHOCYTE: 42.7 %
WBC: 6.6 10*3/uL (ref 3.8–10.8)
WBCMIX: 601 {cells}/uL (ref 200–950)

## 2017-11-03 LAB — LIPID PANEL
CHOL/HDL RATIO: 4.3 (calc) (ref <5.0)
Cholesterol: 216 mg/dL — ABNORMAL HIGH (ref <200)
HDL: 50 mg/dL — ABNORMAL LOW (ref >50)
LDL CHOLESTEROL (CALC): 135 mg/dL — AB
Non-HDL Cholesterol (Calc): 166 mg/dL (calc) — ABNORMAL HIGH (ref <130)
Triglycerides: 175 mg/dL — ABNORMAL HIGH (ref <150)

## 2017-11-03 LAB — COMPLETE METABOLIC PANEL WITH GFR
AG Ratio: 1.6 (calc) (ref 1.0–2.5)
ALKALINE PHOSPHATASE (APISO): 114 U/L (ref 33–130)
ALT: 30 U/L — AB (ref 6–29)
AST: 27 U/L (ref 10–35)
Albumin: 4.2 g/dL (ref 3.6–5.1)
BUN: 17 mg/dL (ref 7–25)
CALCIUM: 9.7 mg/dL (ref 8.6–10.4)
CO2: 28 mmol/L (ref 20–32)
Chloride: 104 mmol/L (ref 98–110)
Creat: 0.82 mg/dL (ref 0.50–1.05)
GFR, EST NON AFRICAN AMERICAN: 79 mL/min/{1.73_m2} (ref >=60)
GFR, Est African American: 91 mL/min/{1.73_m2} (ref >=60)
GLUCOSE: 85 mg/dL (ref 65–99)
Globulin: 2.6 g/dL (calc) (ref 1.9–3.7)
Potassium: 4.3 mmol/L (ref 3.5–5.3)
SODIUM: 140 mmol/L (ref 135–146)
Total Bilirubin: 0.6 mg/dL (ref 0.2–1.2)
Total Protein: 6.8 g/dL (ref 6.1–8.1)

## 2017-11-07 ENCOUNTER — Other Ambulatory Visit: Payer: Self-pay

## 2017-11-07 NOTE — Patient Outreach (Signed)
[facility]) Care Management  11/07/2017  Allison Pham 04-02-1961 [id number]   Medication Adherence call to Allison Pham left a message for patient to call back patient is past due on pravastatin. HIPPA Identifier.  Allison Pham [phone]  Fax [phone] [email or web address]

## 2017-11-12 ENCOUNTER — Other Ambulatory Visit: Payer: Self-pay | Admitting: Family Medicine

## 2017-11-12 DIAGNOSIS — Z5181 Encounter for therapeutic drug level monitoring: Secondary | ICD-10-CM

## 2017-11-12 DIAGNOSIS — E785 Hyperlipidemia, unspecified: Secondary | ICD-10-CM

## 2017-11-12 MED ORDER — PRAVASTATIN SODIUM 80 MG PO TABS: 80.0000 mg | ORAL_TABLET | Freq: Every day | ORAL | 2 refills | Status: DC

## 2017-11-12 NOTE — Telephone Encounter (Signed)
Left detailed voicemial 

## 2017-11-12 NOTE — Telephone Encounter (Signed)
Let's increase her statin from 40 mg to 80 mg daily; take at bedtime Recheck labs on or just after July 2nd (I already ordered them) Thank you

## 2017-11-14 ENCOUNTER — Other Ambulatory Visit: Payer: Self-pay

## 2017-11-14 NOTE — Patient Outreach (Signed)
[facility]) Care Management  11/14/2017  KELYN PONCIANO [birth year] [id number]   Medication Adherence call to Mrs. Logen Fowle patient did not answer patient is due on Pravastatin 40 mg spoke with [facility] they said patient is now taking Pravastatin 80 mg not the 40 mg patient is showing past due under [place] health Care Ins.   [place] [facility] Direct Dial [phone]  Fax [phone] [email or web address]

## 2017-11-28 ENCOUNTER — Encounter: Payer: Self-pay | Admitting: Family Medicine

## 2017-11-29 ENCOUNTER — Other Ambulatory Visit: Payer: Self-pay | Admitting: Family Medicine

## 2017-11-29 ENCOUNTER — Telehealth: Payer: Self-pay | Admitting: Family Medicine

## 2017-11-29 NOTE — Telephone Encounter (Signed)
Notified/confirmed that pts lisinopril was increased to 10mg  at last visit

## 2017-11-29 NOTE — Telephone Encounter (Signed)
Copied from [place] [id number]. Topic: Quick Communication - See Telephone Encounter >> Nov 29, 2017 12:14 PM Synthia Innocent wrote: CRM for notification. See Telephone encounter for: 11/29/17. Requesting to speak with nurse regarding BP Med, does not recall name of drug.

## 2018-02-01 ENCOUNTER — Telehealth: Payer: Self-pay | Admitting: Family Medicine

## 2018-02-01 ENCOUNTER — Encounter: Payer: Self-pay | Admitting: Family Medicine

## 2018-02-01 ENCOUNTER — Ambulatory Visit (INDEPENDENT_AMBULATORY_CARE_PROVIDER_SITE_OTHER): Payer: Medicare Other | Admitting: Family Medicine

## 2018-02-01 VITALS — BP 138/94 | HR 80 | Temp 97.5°F | Resp 16 | Ht 61.0 in | Wt 160.7 lb

## 2018-02-01 DIAGNOSIS — I10 Essential (primary) hypertension: Secondary | ICD-10-CM

## 2018-02-01 DIAGNOSIS — L234 Allergic contact dermatitis due to dyes: Secondary | ICD-10-CM

## 2018-02-01 MED ORDER — PREDNISONE 10 MG PO TABS: ORAL_TABLET | ORAL | 0 refills | Status: AC

## 2018-02-01 MED ORDER — METOPROLOL SUCCINATE ER 25 MG PO TB24: 25.0000 mg | ORAL_TABLET | Freq: Every day | ORAL | 0 refills | Status: DC

## 2018-02-01 NOTE — Telephone Encounter (Signed)
erroneous

## 2018-02-01 NOTE — Progress Notes (Signed)
Name: Allison Pham   MRN: [id number]    DOB: April 25, 1961   Date:02/01/2018       Progress Note  Subjective  Chief Complaint  Chief Complaint  Patient presents with  . Hypertension    elevated BP since medication change. BP running 160/100  . Insect Bite    on back of neck    HPI  PT presents with the following concerns:  HTN:  She has been experiencing elevated BP readings - usually around 160/100.  She does note intermittent blurred vision when her BP is elevated - denies blurred vision at present.  Denies facial droop, slurred speech, extremity weakness or numbness and tingling.   She states that she has been taking 10mg  Lisinopril and sometimes takes an extra.  She had been on Lisinopril-HCTZ 10-12.5 but would take 1/2 tablet on her own because otherwise she would feel "a little high", then a few hours later she would take the other half. She does note that her entire family (parents and 6 sisters) are all on metoprolol as it seems to be the only BP medication that they do well on.  Pt also has anxiety - discussed that this may help with her anxiety symptoms as well.    Rash: Notes rash to scalp/nape of neck for about 1 month.  Has been using neosporin, topical alcohol to try to treat.  Rash developed after having her hair dyed a month ago.  She is concerned as it remains itchy and red, denies pain, fevers, chills, recent insect bite, or contact with anyone with a similar rash.   Patient Active Problem List   Diagnosis Date Noted  . Screening for colon cancer   . Benign neoplasm of ascending colon   . Benign neoplasm of transverse colon   . Polyp of sigmoid colon   . Dizziness 03/21/2017  . Screen for colon cancer 03/21/2017  . Abdominal discomfort 05/30/2016  . Pelvic pain in female 05/25/2016  . Smoking history 05/15/2016  . Elevated serum GGT level 05/15/2016  . Abnormal mammogram of right breast 11/01/2015  . Dermatitis 10/29/2015  . Skin texture changes 10/29/2015  .  Spondylosis of cervical spine 07/08/2015  . Preventative health care 05/29/2015  . Overweight (BMI 25.0-29.9) 05/29/2015  . Breast cancer screening 05/25/2015  . Varicose vein of leg 05/25/2015  . Lymphocytosis 05/03/2015  . Monocytosis 05/03/2015  . Essential hypertension, benign 04/28/2015  . Hip pain 04/28/2015  . Medication monitoring encounter 04/28/2015  . Constipation 04/28/2015  . Dyslipidemia 04/28/2015  . Cervical spine pain 04/23/2015    Social History   Tobacco Use  . Smoking status: Former Smoker    Packs/day: 1.00    Types: Cigarettes    Last attempt to quit: 09/13/2015    Years since quitting: 2.3  . Smokeless tobacco: Never Used  Substance Use Topics  . Alcohol use: Yes    Alcohol/week: 0.0 standard drinks    Comment: rare - holidays     Current Outpatient Medications:  .  busPIRone (BUSPAR) 5 MG tablet, Take 0.5-1 tablets (2.5-5 mg total) by mouth 3 (three) times daily., Disp: 270 tablet, Rfl: 0 .  cholecalciferol (VITAMIN D) 1000 units tablet, Take 1,000 Units by mouth daily., Disp: , Rfl:  .  cyclobenzaprine (FLEXERIL) 5 MG tablet, Take 1 tablet (5 mg total) by mouth at bedtime as needed for muscle spasms. Do not drive while taking as can cause drowsiness., Disp: 10 tablet, Rfl: 0 .  fluticasone (FLONASE) 50  MCG/ACT nasal spray, Place 1 spray into both nostrils 2 (two) times daily. (Patient taking differently: Place 1 spray into both nostrils 2 (two) times daily as needed. ), Disp: 16 g, Rfl: 0 .  gabapentin (NEURONTIN) 300 MG capsule, Take 1 capsule (300 mg total) by mouth at bedtime., Disp: 90 capsule, Rfl: 0 .  lisinopril (PRINIVIL,ZESTRIL) 10 MG tablet, Take 1 tablet (10 mg total) by mouth daily., Disp: 90 tablet, Rfl: 0 .  loratadine (CLARITIN) 10 MG tablet, Take 10 mg by mouth daily as needed for allergies., Disp: , Rfl:  .  pravastatin (PRAVACHOL) 80 MG tablet, Take 1 tablet (80 mg total) by mouth at bedtime., Disp: 30 tablet, Rfl: 2 .  metroNIDAZOLE  (METROGEL VAGINAL) 0.75 % vaginal gel, Place 1 Applicatorful vaginally 2 (two) times daily. (Patient not taking: Reported on 02/01/2018), Disp: 70 g, Rfl: 0  Allergies  Allergen Reactions  . Penicillins Rash    ROS  Constitutional: Negative for fever or weight change.  Respiratory: Negative for cough and shortness of breath.   Cardiovascular: Negative for chest pain or palpitations.  Gastrointestinal: Negative for abdominal pain, no bowel changes.  Musculoskeletal: Negative for gait problem or joint swelling.  Skin: Positive for rash.  Neurological: Negative for dizziness or headache.  No other specific complaints in a complete review of systems (except as listed in HPI above).   Objective  Vitals:   02/01/18 1441  BP: (!) 138/94  Pulse: 80  Resp: 16  Temp: (!) 97.5 F (36.4 C)  TempSrc: Oral  SpO2: 98%  Weight: 160 lb 11.2 oz (72.9 kg)  Height: 5\' 1"  (1.549 m)   Body mass index is 30.36 kg/m.  Nursing Note and Vital Signs reviewed.  Physical Exam  Constitutional: She is oriented to person, place, and time. She appears well-developed and well-nourished.  HENT:  Head: Normocephalic and atraumatic.  Nose: Nose normal.  Eyes: Conjunctivae and EOM are normal. No scleral icterus.  Neck: Normal range of motion. Neck supple.  Cardiovascular: Normal rate, regular rhythm and normal heart sounds.  Pulmonary/Chest: Effort normal and breath sounds normal.  Musculoskeletal: Normal range of motion. She exhibits no edema.  Neurological: She is alert and oriented to person, place, and time. No cranial nerve deficit.  Skin: Skin is warm, dry and intact. No rash noted. No erythema.  Nape of neck and skin behind bilateral ears exhibits discreet maculopapular erythematous lesions without exudate or excoriation, the posterior scalp has several similar lesions along with some underlying erythema.  Psychiatric: She has a normal mood and affect. Her behavior is normal. Judgment and thought  content normal.  Nursing note and vitals reviewed.  No results found for this or any previous visit (from the past 72 hour(s)).  Assessment & Plan  1. Essential hypertension, benign - D/C lisinopril. Discussed metoprolol risk/benefit, advised to take at night. Advised may have secondary gain of benefiting her anxiety as well. - metoprolol succinate (TOPROL-XL) 25 MG 24 hr tablet; Take 1 tablet (25 mg total) by mouth daily.  Dispense: 30 tablet; Refill: 0 - Follow up in 1-1.5 weeks with PCP Dr. Sanda Klein for BP follow up - recommended she bring her wrist cuff for calibration at that visit to ensure home readings are as accurate as possible.  2. Allergic contact dermatitis due to dyes - We will trial a prednisone taper - advised to take with food. If not improving in 1-1.5 weeks, she will return for further evaluation. - predniSONE (DELTASONE) 10 MG tablet; Take  5 tablets (50 mg total) by mouth daily with breakfast for 1 day, THEN 4 tablets (40 mg total) daily with breakfast for 1 day, THEN 3 tablets (30 mg total) daily with breakfast for 1 day, THEN 2 tablets (20 mg total) daily with breakfast for 1 day, THEN 1 tablet (10 mg total) daily with breakfast for 1 day.  Dispense: 15 tablet; Refill: 0  - DASH diet discussed; not exercising but doesn't do a lot. -Red flags and when to present for emergency care or RTC including fever >101.15F, chest pain, shortness of breath, new/worsening/un-resolving symptoms, FAST/symptoms of stroke reviewed in detail including ER for blurred vision reviewed with patient at time of visit. Follow up and care instructions discussed and provided in AVS.

## 2018-02-01 NOTE — Patient Instructions (Addendum)
Contact Dermatitis Dermatitis is redness, soreness, and swelling (inflammation) of the skin. Contact dermatitis is a reaction to certain substances that touch the skin. You either touched something that irritated your skin, or you have allergies to something you touched. Follow these instructions at home: Pace your skin as needed.  Apply cool compresses to the affected areas.  Try taking a bath with: ? Epsom salts. Follow the instructions on the package. You can get these at a pharmacy or grocery store. ? Baking soda. Pour a small amount into the bath as told by your doctor. ? Colloidal oatmeal. Follow the instructions on the package. You can get this at a pharmacy or grocery store.  Try applying baking soda paste to your skin. Stir water into baking soda until it looks like paste.  Do not scratch your skin.  Bathe less often.  Bathe in lukewarm water. Avoid using hot water. Medicines  Take or apply over-the-counter and prescription medicines only as told by your doctor.  If you were prescribed an antibiotic medicine, take or apply your antibiotic as told by your doctor. Do not stop taking the antibiotic even if your condition starts to get better. General instructions  Keep all follow-up visits as told by your doctor. This is important.  Avoid the substance that caused your reaction. If you do not know what caused it, keep a journal to try to track what caused it. Write down: ? What you eat. ? What cosmetic products you use. ? What you drink. ? What you wear in the affected area. This includes jewelry.  If you were given a bandage (dressing), take care of it as told by your doctor. This includes when to change and remove it. Contact a doctor if:  You do not get better with treatment.  Your condition gets worse.  You have signs of infection such as: ? Swelling. ? Tenderness. ? Redness. ? Soreness. ? Warmth.  You have a fever.  You have new  symptoms. Get help right away if:  You have a very bad headache.  You have neck pain.  Your neck is stiff.  You throw up (vomit).  You feel very sleepy.  You see red streaks coming from the affected area.  Your bone or joint underneath the affected area becomes painful after the skin has healed.  The affected area turns darker.  You have trouble breathing. This information is not intended to replace advice given to you by your health care provider. Make sure you discuss any questions you have with your health care provider. Document Released: 04/09/2009 Document Revised: 11/18/2015 Document Reviewed: 10/28/2014 Elsevier Interactive Patient Education  2018 [place] Eating Plan DASH stands for "Dietary Approaches to Stop Hypertension." The DASH eating plan is a healthy eating plan that has been shown to reduce high blood pressure (hypertension). It may also reduce your risk for type 2 diabetes, heart disease, and stroke. The DASH eating plan may also help with weight loss. What are tips for following this plan? General guidelines  Avoid eating more than 2,300 mg (milligrams) of salt (sodium) a day. If you have hypertension, you may need to reduce your sodium intake to 1,500 mg a day.  Limit alcohol intake to no more than 1 drink a day for nonpregnant women and 2 drinks a day for men. One drink equals 12 oz of beer, 5 oz of wine, or 1 oz of hard liquor.  Work with your health care provider to  maintain a healthy body weight or to lose weight. Ask what an ideal weight is for you.  Get at least 30 minutes of exercise that causes your heart to beat faster (aerobic exercise) most days of the week. Activities may include walking, swimming, or biking.  Work with your health care provider or diet and nutrition specialist (dietitian) to adjust your eating plan to your individual calorie needs. Reading food labels  Check food labels for the amount of sodium per serving. Choose  foods with less than 5 percent of the Daily Value of sodium. Generally, foods with less than 300 mg of sodium per serving fit into this eating plan.  To find whole grains, look for the word "whole" as the first word in the ingredient list. Shopping  Buy products labeled as "low-sodium" or "no salt added."  Buy fresh foods. Avoid canned foods and premade or frozen meals. Cooking  Avoid adding salt when cooking. Use salt-free seasonings or herbs instead of table salt or sea salt. Check with your health care provider or pharmacist before using salt substitutes.  Do not fry foods. Cook foods using healthy methods such as baking, boiling, grilling, and broiling instead.  Cook with heart-healthy oils, such as olive, canola, soybean, or sunflower oil. Meal planning   Eat a balanced diet that includes: ? 5 or more servings of fruits and vegetables each day. At each meal, try to fill half of your plate with fruits and vegetables. ? Up to 6-8 servings of whole grains each day. ? Less than 6 oz of lean meat, poultry, or fish each day. A 3-oz serving of meat is about the same size as a deck of cards. One egg equals 1 oz. ? 2 servings of low-fat dairy each day. ? A serving of nuts, seeds, or beans 5 times each week. ? Heart-healthy fats. Healthy fats called Omega-3 fatty acids are found in foods such as flaxseeds and coldwater fish, like sardines, salmon, and mackerel.  Limit how much you eat of the following: ? Canned or prepackaged foods. ? Food that is high in trans fat, such as fried foods. ? Food that is high in saturated fat, such as fatty meat. ? Sweets, desserts, sugary drinks, and other foods with added sugar. ? Full-fat dairy products.  Do not salt foods before eating.  Try to eat at least 2 vegetarian meals each week.  Eat more home-cooked food and less restaurant, buffet, and fast food.  When eating at a restaurant, ask that your food be prepared with less salt or no salt, if  possible. What foods are recommended? The items listed may not be a complete list. Talk with your dietitian about what dietary choices are best for you. Grains Whole-grain or whole-wheat bread. Whole-grain or whole-wheat pasta. Brown rice. [place]. Bulgur. Whole-grain and low-sodium cereals. Pita bread. Low-fat, low-sodium crackers. Whole-wheat flour tortillas. Vegetables Fresh or frozen vegetables (raw, steamed, roasted, or grilled). Low-sodium or reduced-sodium tomato and vegetable juice. Low-sodium or reduced-sodium tomato sauce and tomato paste. Low-sodium or reduced-sodium canned vegetables. Fruits All fresh, dried, or frozen fruit. Canned fruit in natural juice (without added sugar). Meat and other protein foods Skinless chicken or [place]. Ground chicken or [place]. Pork with fat trimmed off. Fish and seafood. Egg whites. Dried beans, peas, or lentils. Unsalted nuts, nut butters, and seeds. Unsalted canned beans. Lean cuts of beef with fat trimmed off. Low-sodium, lean deli meat. Dairy Low-fat (1%) or fat-free (skim) milk. Fat-free, low-fat, or reduced-fat cheeses. Nonfat,  low-sodium ricotta or cottage cheese. Low-fat or nonfat yogurt. Low-fat, low-sodium cheese. Fats and oils Soft margarine without trans fats. Vegetable oil. Low-fat, reduced-fat, or light mayonnaise and salad dressings (reduced-sodium). Canola, safflower, olive, soybean, and sunflower oils. Avocado. Seasoning and other foods Herbs. Spices. Seasoning mixes without salt. Unsalted popcorn and pretzels. Fat-free sweets. What foods are not recommended? The items listed may not be a complete list. Talk with your dietitian about what dietary choices are best for you. Grains Baked goods made with fat, such as croissants, muffins, or some breads. Dry pasta or rice meal packs. Vegetables Creamed or fried vegetables. Vegetables in a cheese sauce. Regular canned vegetables (not low-sodium or reduced-sodium). Regular canned  tomato sauce and paste (not low-sodium or reduced-sodium). Regular tomato and vegetable juice (not low-sodium or reduced-sodium). Angie Fava. Olives. Fruits Canned fruit in a light or heavy syrup. Fried fruit. Fruit in cream or butter sauce. Meat and other protein foods Fatty cuts of meat. Ribs. Fried meat. Berniece Salines. Sausage. Bologna and other processed lunch meats. Salami. Fatback. Hotdogs. Bratwurst. Salted nuts and seeds. Canned beans with added salt. Canned or smoked fish. Whole eggs or egg yolks. Chicken or [place] with skin. Dairy Whole or 2% milk, cream, and half-and-half. Whole or full-fat cream cheese. Whole-fat or sweetened yogurt. Full-fat cheese. Nondairy creamers. Whipped toppings. Processed cheese and cheese spreads. Fats and oils Butter. Stick margarine. Lard. Shortening. Ghee. Bacon fat. Tropical oils, such as coconut, palm kernel, or palm oil. Seasoning and other foods Salted popcorn and pretzels. Onion salt, garlic salt, seasoned salt, table salt, and sea salt. Worcestershire sauce. Tartar sauce. Barbecue sauce. Teriyaki sauce. Soy sauce, including reduced-sodium. Steak sauce. Canned and packaged gravies. Fish sauce. Oyster sauce. Cocktail sauce. Horseradish that you find on the shelf. Ketchup. Mustard. Meat flavorings and tenderizers. Bouillon cubes. Hot sauce and Tabasco sauce. Premade or packaged marinades. Premade or packaged taco seasonings. Relishes. Regular salad dressings. Where to find more information:  National Heart, Lung, and [place]: https://[place].com/  American Heart Association: www.heart.org Summary  The DASH eating plan is a healthy eating plan that has been shown to reduce high blood pressure (hypertension). It may also reduce your risk for type 2 diabetes, heart disease, and stroke.  With the DASH eating plan, you should limit salt (sodium) intake to 2,300 mg a day. If you have hypertension, you may need to reduce your sodium intake to 1,500 mg a day.  When  on the DASH eating plan, aim to eat more fresh fruits and vegetables, whole grains, lean proteins, low-fat dairy, and heart-healthy fats.  Work with your health care provider or diet and nutrition specialist (dietitian) to adjust your eating plan to your individual calorie needs. This information is not intended to replace advice given to you by your health care provider. Make sure you discuss any questions you have with your health care provider. Document Released: 06/01/2011 Document Revised: 06/05/2016 Document Reviewed: 06/05/2016 Elsevier Interactive Patient Education  [employer].

## 2018-02-11 ENCOUNTER — Encounter: Payer: Self-pay | Admitting: Family Medicine

## 2018-02-11 ENCOUNTER — Other Ambulatory Visit: Payer: Self-pay | Admitting: Family Medicine

## 2018-02-11 ENCOUNTER — Ambulatory Visit (INDEPENDENT_AMBULATORY_CARE_PROVIDER_SITE_OTHER): Payer: Medicare Other | Admitting: Family Medicine

## 2018-02-11 VITALS — BP 128/74 | HR 89 | Temp 98.3°F | Ht 60.0 in | Wt 156.6 lb

## 2018-02-11 DIAGNOSIS — E663 Overweight: Secondary | ICD-10-CM

## 2018-02-11 DIAGNOSIS — R748 Abnormal levels of other serum enzymes: Secondary | ICD-10-CM | POA: Diagnosis not present

## 2018-02-11 DIAGNOSIS — Z1231 Encounter for screening mammogram for malignant neoplasm of breast: Secondary | ICD-10-CM | POA: Diagnosis not present

## 2018-02-11 DIAGNOSIS — R200 Anesthesia of skin: Secondary | ICD-10-CM | POA: Diagnosis not present

## 2018-02-11 DIAGNOSIS — I10 Essential (primary) hypertension: Secondary | ICD-10-CM

## 2018-02-11 DIAGNOSIS — E785 Hyperlipidemia, unspecified: Secondary | ICD-10-CM

## 2018-02-11 DIAGNOSIS — R000 Tachycardia, unspecified: Secondary | ICD-10-CM

## 2018-02-11 DIAGNOSIS — I83812 Varicose veins of left lower extremities with pain: Secondary | ICD-10-CM | POA: Diagnosis not present

## 2018-02-11 DIAGNOSIS — Z1239 Encounter for other screening for malignant neoplasm of breast: Secondary | ICD-10-CM

## 2018-02-11 DIAGNOSIS — R718 Other abnormality of red blood cells: Secondary | ICD-10-CM

## 2018-02-11 DIAGNOSIS — Z87891 Personal history of nicotine dependence: Secondary | ICD-10-CM

## 2018-02-11 DIAGNOSIS — E669 Obesity, unspecified: Secondary | ICD-10-CM

## 2018-02-11 DIAGNOSIS — L309 Dermatitis, unspecified: Secondary | ICD-10-CM

## 2018-02-11 MED ORDER — TRIAMCINOLONE ACETONIDE 0.1 % EX CREA: 1.0000 "application " | TOPICAL_CREAM | Freq: Two times a day (BID) | CUTANEOUS | 0 refills | Status: DC

## 2018-02-11 NOTE — Assessment & Plan Note (Signed)
Encouraged weight loss 

## 2018-02-11 NOTE — Progress Notes (Signed)
BP 128/74   Pulse 89   Temp 98.3 F (36.8 C)   Ht 5' (1.524 m)   Wt 156 lb 9.6 oz (71 kg)   SpO2 94%   BMI 30.58 kg/m    Subjective:    Patient ID: Allison Pham, female    DOB: 1961-02-12, 57 y.o.   MRN: [id number]  HPI: Allison Pham is a 57 y.o. female  Chief Complaint  Patient presents with  . Follow-up    HPI  Patient is here for f/u  High blood pressure; takes metoprolol; she was just seen by colleague on 02/01/2018 with a BP of 138/94 She had been seeing pressures in the 160/100 range prior to the visit; she thinks that the other medicine was not agreeing with her body; she is now on Toprol and is checking BP away from [place] She has noticed craving food real bad High BP runs in the family  She had a rash on the back of the neck after having her hair colored; she has red rash on the sides and posterior neck; they gave her five day steroid; maybe cleared it up a little bit; got back from beach and the salt water helped while she was there; she just noticed some of the rash on her chest when the nurse did her EKG  She has high cholesterol; using pravastatin; last lipid panel reviewed Lab Results  Component Value Date   CHOL 216 (H) 11/02/2017   HDL 50 (L) 11/02/2017   [place] 135 (H) 11/02/2017   TRIG 175 (H) 11/02/2017   CHOLHDL 4.3 11/02/2017   She wants to see a heart doctor; noticed heart racing with regular beats, just fact; heavy and pounding; lasts for a long time; happens when laying in bed; might be anxiety she says; she wants to see a heart doctor to see if there is a blockage; she is afraid to go into the bedroom to bed, lays on the couch instead; has not happened since being on the metorprolol; feels electric shocks in her chest and head at times; no exertion to even know if it would bother her (she does not exercise)  She has allergies; using flonase  She has anxiety; takes buspar for symptoms  She is overweight; weight down four pounds since last visit,  though  She asked for a handicapped sticker; she says if she walks far distances, it hurts real bad; she has to park far away at times; she saw the vein doctor; she saw the doctor and she got the hose  The right hand gets numb sometimes; going on for 3 weeks; keeps squeezing to keep it circulated; right-handed;   Depression screen [facility] 2/9 02/11/2018 02/01/2018 02/01/2018 10/19/2017 03/21/2017  Decreased Interest 1 0 0 0 0  Down, Depressed, Hopeless 1 0 0 0 0  PHQ - 2 Score 2 0 0 0 0  Altered sleeping 0 0 - - -  Tired, decreased energy 2 2 - - -  Change in appetite 0 0 - - -  Feeling bad or failure about yourself  0 0 - - -  Trouble concentrating 0 0 - - -  Moving slowly or fidgety/restless 0 0 - - -  Suicidal thoughts 0 0 - - -  PHQ-9 Score 4 2 - - -  Difficult doing work/chores Somewhat difficult Not difficult at all - - -    Relevant past medical, surgical, family and social history reviewed Past Medical History:  Diagnosis Date  .  Anxiety   . GERD (gastroesophageal reflux disease)   . Hypercholesteremia   . Hypertension   . Tick bite   . Vertigo   . Wears dentures    partial upper   Past Surgical History:  Procedure Laterality Date  . ABDOMINAL HYSTERECTOMY     Partial  . COLONOSCOPY WITH PROPOFOL N/A 05/10/2017   Procedure: COLONOSCOPY WITH PROPOFOL;  Surgeon: [name], MD;  Location: [facility];  Service: Endoscopy;  Laterality: N/A;  . HEMORRHOID SURGERY    . POLYPECTOMY N/A 05/10/2017   Procedure: POLYPECTOMY;  Surgeon: [name], MD;  Location: [facility];  Service: Endoscopy;  Laterality: N/A;   Family History  Problem Relation Age of Onset  . Cancer Mother        Colon  . Hypertension Mother   . Hypertension Father   . Heart attack Father   . Heart disease Father   . Hypertension Sister   . COPD Sister   . Hypertension Brother   . Heart disease Brother   . Cancer Maternal Aunt   . Cancer Maternal Uncle   . Diabetes Neg Hx   .  Stroke Neg Hx   . Breast cancer Neg Hx    Social History   Tobacco Use  . Smoking status: Former Smoker    Packs/day: 1.00    Types: Cigarettes    Last attempt to quit: 09/13/2015    Years since quitting: 2.4  . Smokeless tobacco: Never Used  Substance Use Topics  . Alcohol use: Never    Alcohol/week: 0.0 standard drinks    Frequency: Never    Comment: rare - holidays  . Drug use: No   Interim medical history since last visit reviewed. Allergies and medications reviewed  Review of Systems Per HPI unless specifically indicated above     Objective:    BP 128/74   Pulse 89   Temp 98.3 F (36.8 C)   Ht 5' (1.524 m)   Wt 156 lb 9.6 oz (71 kg)   SpO2 94%   BMI 30.58 kg/m   Wt Readings from Last 3 Encounters:  02/11/18 156 lb 9.6 oz (71 kg)  02/01/18 160 lb 11.2 oz (72.9 kg)  10/19/17 157 lb (71.2 kg)    Physical Exam  Constitutional: She appears well-developed and well-nourished. No distress.  HENT:  Head: Normocephalic and atraumatic.  Mouth/Throat: Mucous membranes are normal.  Eyes: EOM are normal. No scleral icterus.  Neck: No thyromegaly present.  Cardiovascular: Normal rate, regular rhythm and normal heart sounds.  No murmur heard. Pulmonary/Chest: Effort normal and breath sounds normal. No respiratory distress. She has no wheezes.  Abdominal: Soft. Bowel sounds are normal. She exhibits no distension.  Musculoskeletal: She exhibits no edema (no pitting edema of the legs).  Neurological: She is alert. She displays no tremor.  UE strength 5/5; no muscle atrophy of the RUE; unable to elicit a Phalen's on the right  Skin: Skin is warm and dry. She is not diaphoretic. No pallor.  Varicose veins of left leg noted  Psychiatric: She has a normal mood and affect. Her behavior is normal. Judgment and thought content normal. Her mood appears not anxious. She does not exhibit a depressed mood.    Results for orders placed or performed in visit on 11/02/17  Lipid panel   Result Value Ref Range   Cholesterol 216 (H) <200 mg/dL   HDL 50 (L) >50 mg/dL   Triglycerides 175 (H) <150 mg/dL   LDL  Cholesterol (Calc) 135 (H) mg/dL (calc)   Total CHOL/HDL Ratio 4.3 <5.0 (calc)   Non-HDL Cholesterol (Calc) 166 (H) <130 mg/dL (calc)  COMPLETE METABOLIC PANEL WITH GFR  Result Value Ref Range   Glucose, Bld 85 65 - 99 mg/dL   BUN 17 7 - 25 mg/dL   Creat 0.82 0.50 - 1.05 mg/dL   GFR, Est Non African American 79 > OR = 60 mL/min/1.77m2   GFR, Est African American 91 > OR = 60 mL/min/1.85m2   BUN/Creatinine Ratio NOT APPLICABLE 6 - 22 (calc)   Sodium 140 135 - 146 mmol/L   Potassium 4.3 3.5 - 5.3 mmol/L   Chloride 104 98 - 110 mmol/L   CO2 28 20 - 32 mmol/L   Calcium 9.7 8.6 - 10.4 mg/dL   Total Protein 6.8 6.1 - 8.1 g/dL   Albumin 4.2 3.6 - 5.1 g/dL   Globulin 2.6 1.9 - 3.7 g/dL (calc)   AG Ratio 1.6 1.0 - 2.5 (calc)   Total Bilirubin 0.6 0.2 - 1.2 mg/dL   Alkaline phosphatase (APISO) 114 33 - 130 U/L   AST 27 10 - 35 U/L   ALT 30 (H) 6 - 29 U/L  CBC with Differential/Platelet  Result Value Ref Range   WBC 6.6 3.8 - 10.8 Thousand/uL   RBC 5.33 (H) 3.80 - 5.10 Million/uL   Hemoglobin 14.9 11.7 - 15.5 g/dL   HCT 43.4 35.0 - 45.0 %   MCV 81.4 80.0 - 100.0 fL   MCH 28.0 27.0 - 33.0 pg   MCHC 34.3 32.0 - 36.0 g/dL   RDW 13.2 11.0 - 15.0 %   Platelets 233 140 - 400 Thousand/uL   MPV 10.7 7.5 - 12.5 fL   Neutro Abs 2,983 1,500 - 7,800 cells/uL   Lymphs Abs 2,818 850 - 3,900 cells/uL   WBC mixed population 601 200 - 950 cells/uL   Eosinophils Absolute 139 15 - 500 cells/uL   Basophils Absolute 59 0 - 200 cells/uL   Neutrophils Relative % 45.2 %   Total Lymphocyte 42.7 %   Monocytes Relative 9.1 %   Eosinophils Relative 2.1 %   Basophils Relative 0.9 %      Assessment & Plan:   Problem List Items Addressed This Visit      Cardiovascular and Mediastinum   Varicose vein of leg    With pain; wear stockings morning to night, but do not sleep in them;  refer to other vascular doctor because of patient's preference; try horse chestnut      Relevant Orders   Ambulatory referral to Vascular Surgery   Essential hypertension, benign - Primary    Controlled; try to limit salt        Musculoskeletal and Integument   Dermatitis    Topical corticosteroid; if not resolving, then refer to derm        Other   Elevated serum GGT level   Relevant Orders   Comprehensive metabolic panel   Smoking history    Patient believes that she probably smoked more than 30 pack years; started at age 5; quit around age 82; quit for just a months at a time off and on, but smoked most of that time; will put in referral for lung cancer screening chest CT      Relevant Orders   CT CHEST LUNG CA SCREEN LOW DOSE W/O CM   RESOLVED: Overweight (BMI 25.0-29.9)    Encouraged weight loss      Relevant Orders  TSH   Obesity (BMI 30.0-34.9)    Encouraged weight loss, more veggies; limit fatty foods; hydrate      Dyslipidemia    Check lipids; goal LDL less than 100 at least, under 70 would be ideal      Relevant Orders   Lipid panel   Breast cancer screening    Due Nov 2019       Other Visit Diagnoses    Racing heart beat       Relevant Orders   Ambulatory referral to Cardiology   EKG 12-Lead   TSH   Magnesium   Numbness of right hand       sx suggestive of carpal tunnel syndrome; neg Phalen's; will check TSH and B12   Relevant Orders   TSH   Vitamin B12   Elevated red blood cell count       Relevant Orders   CBC with Differential/Platelet      EKG: NSR, no ST-T wave changes, normal axis  Follow up plan: Return in about 3 months (around 05/14/2018).  An after-visit summary was printed and given to the patient at [facility].  Please see the patient instructions which may contain other information and recommendations beyond what is mentioned above in the assessment and plan.  Meds ordered this encounter  Medications  . triamcinolone  cream (KENALOG) 0.1 %    Sig: Apply 1 application topically 2 (two) times daily.    Dispense:  30 g    Refill:  0    Orders Placed This Encounter  Procedures  . CT CHEST LUNG CA SCREEN LOW DOSE W/O CM  . Lipid panel  . TSH  . Magnesium  . CBC with Differential/Platelet  . Comprehensive metabolic panel  . Vitamin B12  . Ambulatory referral to Cardiology  . Ambulatory referral to Vascular Surgery  . EKG 12-Lead

## 2018-02-11 NOTE — Assessment & Plan Note (Signed)
Check lipids; goal LDL less than 100 at least, under 70 would be ideal

## 2018-02-11 NOTE — Assessment & Plan Note (Signed)
Controlled; try to limit salt

## 2018-02-11 NOTE — Assessment & Plan Note (Signed)
Encouraged weight loss, more veggies; limit fatty foods; hydrate

## 2018-02-11 NOTE — Assessment & Plan Note (Signed)
With pain; wear stockings morning to night, but do not sleep in them; refer to other vascular doctor because of patient's preference; try horse chestnut

## 2018-02-11 NOTE — Assessment & Plan Note (Signed)
Due Nov 2019

## 2018-02-11 NOTE — Assessment & Plan Note (Signed)
Patient believes that she probably smoked more than 30 pack years; started at age 57; quit around age 57; quit for just a months at a time off and on, but smoked most of that time; will put in referral for lung cancer screening chest CT

## 2018-02-11 NOTE — Telephone Encounter (Signed)
Statin refill requested; lipids just drawn today Dose or med may change; Rx denied

## 2018-02-11 NOTE — Assessment & Plan Note (Signed)
Topical corticosteroid; if not resolving, then refer to derm

## 2018-02-11 NOTE — Patient Instructions (Addendum)
Wear the compression stockings morning to night Try horse chestnut Use the cream on the back of the neck and if not improving, let me know and we'll have you see the dermatologist We'll have you see the vein doctor and the heart doctor  Check out the information at familydoctor.org entitled "Nutrition for Weight Loss: What You Need to Know about Fad Diets" Try to lose between 1-2 pounds per week by taking in fewer calories and burning off more calories You can succeed by limiting portions, limiting foods dense in calories and fat, becoming more active, and drinking 8 glasses of water a day (64 ounces) Don't skip meals, especially breakfast, as skipping meals may alter your metabolism Do not use over-the-counter weight loss pills or gimmicks that claim rapid weight loss A healthy BMI (or body mass index) is between 18.5 and 24.9 You can calculate your ideal BMI at the [place] website ClubMonetize.fr   Preventing Unhealthy Weight Gain, Adult Staying at a healthy weight is important. When fat builds up in your body, you may become overweight or obese. These conditions put you at greater risk for developing certain health problems, such as heart disease, diabetes, sleeping problems, joint problems, and some cancers. Unhealthy weight gain is often the result of making unhealthy choices in what you eat. It is also a result of not getting enough exercise. You can make changes to your lifestyle to prevent obesity and stay as healthy as possible. What nutrition changes can be made? To maintain a healthy weight and prevent obesity:  Eat only as much as your body needs. To do this: ? Pay attention to signs that you are hungry or full. Stop eating as soon as you feel full. ? If you feel hungry, try drinking water first. Drink enough water so your urine is clear or pale yellow. ? Eat smaller portions. ? Look at serving sizes on food labels. Most foods  contain more than one serving per container. ? Eat the recommended amount of calories for your gender and activity level. While most active people should eat around 2,000 calories per day, if you are trying to lose weight or are not very active, you main need to eat less calories. Talk to your health care provider or dietitian about how many calories you should eat each day.  Choose healthy foods, such as: ? Fruits and vegetables. Try to fill at least half of your plate at each meal with fruits and vegetables. ? Whole grains, such as whole wheat bread, brown rice, and quinoa. ? Lean meats, such as chicken or fish. ? Other healthy proteins, such as beans, eggs, or tofu. ? Healthy fats, such as nuts, seeds, fatty fish, and olive oil. ? Low-fat or fat-free dairy.  Check food labels and avoid food and drinks that: ? Are high in calories. ? Have added sugar. ? Are high in sodium. ? Have saturated fats or trans fats.  Limit how much you eat of the following foods: ? Prepackaged meals. ? Fast food. ? Fried foods. ? Processed meat, such as bacon, sausage, and deli meats. ? Fatty cuts of red meat and poultry with skin.  Cook foods in healthier ways, such as by baking, broiling, or grilling.  When grocery shopping, try to shop around the outside of the store. This helps you buy mostly fresh foods and avoid canned and prepackaged foods.  What lifestyle changes can be made?  Exercise at least 30 minutes 5 or more days each week. Exercising includes brisk  walking, yard work, biking, running, swimming, and team sports like basketball and soccer. Ask your health care provider which exercises are safe for you.  Do not use any products that contain nicotine or tobacco, such as cigarettes and e-cigarettes. If you need help quitting, ask your health care provider.  Limit alcohol intake to no more than 1 drink a day for nonpregnant women and 2 drinks a day for men. One drink equals 12 oz of beer, 5 oz  of wine, or 1 oz of hard liquor.  Try to get 7-9 hours of sleep each night. What other changes can be made?  Keep a food and activity journal to keep track of: ? What you ate and how many calories you had. Remember to count sauces, dressings, and side dishes. ? Whether you were active, and what exercises you did. ? Your calorie, weight, and activity goals.  Check your weight regularly. Track any changes. If you notice you have gained weight, make changes to your diet or activity routine.  Avoid taking weight-loss medicines or supplements. Talk to your health care provider before starting any new medicine or supplement.  Talk to your health care provider before trying any new diet or exercise plan. Why are these changes important? Eating healthy, staying active, and having healthy habits not only help prevent obesity, they also:  Help you to manage stress and emotions.  Help you to connect with friends and family.  Improve your self-esteem.  Improve your sleep.  Prevent long-term health problems.  What can happen if changes are not made? Being obese or overweight can cause you to develop joint or bone problems, which can make it hard for you to stay active or do activities you enjoy. Being obese or overweight also puts stress on your heart and lungs and can lead to health problems like diabetes, heart disease, and some cancers. Where to find more information: Talk with your health care provider or a dietitian about healthy eating and healthy lifestyle choices. You may also find other information through these resources:  U.S. Department of Agriculture MyPlate: FormerBoss.no  American Heart Association: www.heart.org  Centers for Disease Control and Prevention: http://www.wolf.info/  Summary  Staying at a healthy weight is important. It helps prevent certain diseases and health problems, such as heart disease, diabetes, joint problems, sleep disorders, and some cancers.  Being  obese or overweight can cause you to develop joint or bone problems, which can make it hard for you to stay active or do activities you enjoy.  You can prevent unhealthy weight gain by eating a healthy diet, exercising regularly, not smoking, limiting alcohol, and getting enough sleep.  Talk with your health care provider or a dietitian for guidance about healthy eating and healthy lifestyle choices. This information is not intended to replace advice given to you by your health care provider. Make sure you discuss any questions you have with your health care provider. Document Released: 06/13/2016 Document Revised: 07/19/2016 Document Reviewed: 07/19/2016 Elsevier Interactive Patient Education  [employer].

## 2018-02-12 ENCOUNTER — Other Ambulatory Visit: Payer: Self-pay | Admitting: Family Medicine

## 2018-02-12 ENCOUNTER — Telehealth: Payer: Self-pay | Admitting: *Deleted

## 2018-02-12 DIAGNOSIS — R7401 Elevation of levels of liver transaminase levels: Secondary | ICD-10-CM

## 2018-02-12 DIAGNOSIS — E785 Hyperlipidemia, unspecified: Secondary | ICD-10-CM

## 2018-02-12 DIAGNOSIS — R740 Nonspecific elevation of levels of transaminase and lactic acid dehydrogenase [LDH]: Secondary | ICD-10-CM

## 2018-02-12 LAB — CBC WITH DIFFERENTIAL/PLATELET
BASOS ABS: 83 {cells}/uL (ref 0–200)
Basophils Relative: 1.1 %
EOS ABS: 233 {cells}/uL (ref 15–500)
Eosinophils Relative: 3.1 %
HCT: 44.1 % (ref 35.0–45.0)
Hemoglobin: 15.1 g/dL (ref 11.7–15.5)
Lymphs Abs: 2678 cells/uL (ref 850–3900)
MCH: 28.4 pg (ref 27.0–33.0)
MCHC: 34.2 g/dL (ref 32.0–36.0)
MCV: 83.1 fL (ref 80.0–100.0)
MONOS PCT: 8.3 %
MPV: 10.9 fL (ref 7.5–12.5)
NEUTROS PCT: 51.8 %
Neutro Abs: 3885 cells/uL (ref 1500–7800)
Platelets: 214 10*3/uL (ref 140–400)
RBC: 5.31 10*6/uL — ABNORMAL HIGH (ref 3.80–5.10)
RDW: 13.9 % (ref 11.0–15.0)
TOTAL LYMPHOCYTE: 35.7 %
WBC mixed population: 623 cells/uL (ref 200–950)
WBC: 7.5 10*3/uL (ref 3.8–10.8)

## 2018-02-12 LAB — COMPREHENSIVE METABOLIC PANEL
AG RATIO: 1.8 (calc) (ref 1.0–2.5)
ALT: 90 U/L — AB (ref 6–29)
AST: 57 U/L — AB (ref 10–35)
Albumin: 4.4 g/dL (ref 3.6–5.1)
Alkaline phosphatase (APISO): 128 U/L (ref 33–130)
BUN: 19 mg/dL (ref 7–25)
CALCIUM: 9.4 mg/dL (ref 8.6–10.4)
CHLORIDE: 108 mmol/L (ref 98–110)
CO2: 26 mmol/L (ref 20–32)
Creat: 0.8 mg/dL (ref 0.50–1.05)
GLOBULIN: 2.4 g/dL (ref 1.9–3.7)
Glucose, Bld: 88 mg/dL (ref 65–139)
Potassium: 4.4 mmol/L (ref 3.5–5.3)
SODIUM: 142 mmol/L (ref 135–146)
Total Bilirubin: 0.4 mg/dL (ref 0.2–1.2)
Total Protein: 6.8 g/dL (ref 6.1–8.1)

## 2018-02-12 LAB — LIPID PANEL
CHOL/HDL RATIO: 3.9 (calc) (ref <5.0)
CHOLESTEROL: 249 mg/dL — AB (ref <200)
HDL: 64 mg/dL (ref >50)
LDL CHOLESTEROL (CALC): 164 mg/dL — AB
Non-HDL Cholesterol (Calc): 185 mg/dL (calc) — ABNORMAL HIGH (ref <130)
Triglycerides: 99 mg/dL (ref <150)

## 2018-02-12 LAB — MAGNESIUM: Magnesium: 2.1 mg/dL (ref 1.5–2.5)

## 2018-02-12 LAB — VITAMIN B12: Vitamin B-12: 645 pg/mL (ref 200–1100)

## 2018-02-12 LAB — TSH: TSH: 1.13 mIU/L (ref 0.40–4.50)

## 2018-02-12 MED ORDER — EZETIMIBE 10 MG PO TABS: 10.0000 mg | ORAL_TABLET | Freq: Every day | ORAL | 3 refills | Status: DC

## 2018-02-12 NOTE — Telephone Encounter (Signed)
Received referral for low dose lung cancer screening CT scan.  Message left at phone number listed in EMR for patient to call either myself or Burgess Estelle back at [phone] to facilitate scheduling the scan.

## 2018-02-12 NOTE — Telephone Encounter (Signed)
Refill request received for pravastatin Not effective and patient has elevated LFTs (SGPT more than 3x upper limit of normal) Will use zetia and refer to GI

## 2018-02-13 ENCOUNTER — Encounter: Payer: Self-pay | Admitting: Gastroenterology

## 2018-02-14 ENCOUNTER — Telehealth: Payer: Self-pay | Admitting: *Deleted

## 2018-02-14 DIAGNOSIS — Z122 Encounter for screening for malignant neoplasm of respiratory organs: Secondary | ICD-10-CM

## 2018-02-14 NOTE — Telephone Encounter (Signed)
Received a referral for initial lung cancer screening scan.  Contacted the patient and obtained their smoking history, former smoker quit in 2017, 38  pkyr history as well as answering questions related to screening process.  Patient denies signs of lung cancer such as weight loss or hemoptysis at this time.  Patient denies comorbidity that would prevent curative treatment if lung cancer were found.  Patient is scheduled for the Shared Decision Making Visit and CT scan on SDM 03-14-18 [id number].

## 2018-02-18 ENCOUNTER — Other Ambulatory Visit: Payer: Self-pay | Admitting: Family Medicine

## 2018-03-08 ENCOUNTER — Other Ambulatory Visit: Payer: Self-pay

## 2018-03-08 DIAGNOSIS — I10 Essential (primary) hypertension: Secondary | ICD-10-CM

## 2018-03-08 MED ORDER — METOPROLOL SUCCINATE ER 25 MG PO TB24: 25.0000 mg | ORAL_TABLET | Freq: Every day | ORAL | 5 refills | Status: DC

## 2018-03-13 ENCOUNTER — Encounter: Payer: Self-pay | Admitting: Nurse Practitioner

## 2018-03-14 ENCOUNTER — Other Ambulatory Visit: Payer: Self-pay | Admitting: Family Medicine

## 2018-03-14 ENCOUNTER — Inpatient Hospital Stay: Payer: Medicare Other | Attending: Nurse Practitioner | Admitting: Nurse Practitioner

## 2018-03-14 ENCOUNTER — Ambulatory Visit
Admission: RE | Admit: 2018-03-14 | Discharge: 2018-03-14 | Disposition: A | Discharge status: Home / Self Care | Payer: Medicare Other | Source: Ambulatory Visit | Attending: Nurse Practitioner | Admitting: Nurse Practitioner

## 2018-03-14 DIAGNOSIS — J432 Centrilobular emphysema: Secondary | ICD-10-CM | POA: Diagnosis not present

## 2018-03-14 DIAGNOSIS — Z122 Encounter for screening for malignant neoplasm of respiratory organs: Secondary | ICD-10-CM | POA: Diagnosis not present

## 2018-03-14 DIAGNOSIS — Z87891 Personal history of nicotine dependence: Secondary | ICD-10-CM | POA: Diagnosis not present

## 2018-03-14 NOTE — Progress Notes (Signed)
In accordance with CMS guidelines, patient has met eligibility criteria including age, absence of signs or symptoms of lung cancer.  Social History   Tobacco Use  . Smoking status: Former Smoker    Packs/day: 1.00    Years: 38.00    Pack years: 38.00    Types: Cigarettes    Last attempt to quit: 09/13/2015    Years since quitting: 2.5  . Smokeless tobacco: Never Used  Substance Use Topics  . Alcohol use: Never    Alcohol/week: 0.0 standard drinks    Frequency: Never    Comment: rare - holidays  . Drug use: No      A shared decision-making session was conducted prior to the performance of CT scan. This includes one or more decision aids, includes benefits and harms of screening, follow-up diagnostic testing, over-diagnosis, false positive rate, and total radiation exposure.   Counseling on the importance of adherence to annual lung cancer LDCT screening, impact of co-morbidities, and ability or willingness to undergo diagnosis and treatment is imperative for compliance of the program.   Counseling on the importance of continued smoking cessation for former smokers; the importance of smoking cessation for current smokers, and information about tobacco cessation interventions have been given to patient including [place] and 1800 quit [place] programs.   Written order for lung cancer screening with LDCT has been given to the patient and any and all questions have been answered to the best of my abilities.    Yearly follow up will be coordinated by Burgess Estelle, Thoracic Navigator.  Beckey Rutter, DNP, AGNP-C [place] at [facility] [phone] (work cell) [phone] (office) 03/14/18 3:00 PM

## 2018-03-15 ENCOUNTER — Other Ambulatory Visit: Payer: Self-pay | Admitting: Family Medicine

## 2018-03-15 NOTE — Telephone Encounter (Signed)
Lisinopril requested; however, per last note patient said it wasn't agreeing with her body; stopped it and now just on beta-blocker

## 2018-03-19 ENCOUNTER — Telehealth: Payer: Self-pay

## 2018-03-19 ENCOUNTER — Encounter: Payer: Self-pay | Admitting: *Deleted

## 2018-03-19 DIAGNOSIS — R748 Abnormal levels of other serum enzymes: Secondary | ICD-10-CM

## 2018-03-19 DIAGNOSIS — R740 Nonspecific elevation of levels of transaminase and lactic acid dehydrogenase [LDH]: Principal | ICD-10-CM

## 2018-03-19 DIAGNOSIS — R7401 Elevation of levels of liver transaminase levels: Secondary | ICD-10-CM

## 2018-03-19 NOTE — Telephone Encounter (Signed)
Copied from [place] [id number]. Topic: Inquiry >> Mar 19, 2018 12:01 PM Allison Pham wrote: Reason for CRM: Patient is calling to request another test to be completed for her blood work, she is requesting to hae labs drawn in the office before she see a specialist in regards to her liver. Her best contact number [phone] she stated a message can be left. Please advise.

## 2018-03-19 NOTE — Telephone Encounter (Signed)
That's fine I put in orders

## 2018-03-20 ENCOUNTER — Other Ambulatory Visit: Payer: Self-pay

## 2018-03-20 DIAGNOSIS — R740 Nonspecific elevation of levels of transaminase and lactic acid dehydrogenase [LDH]: Principal | ICD-10-CM

## 2018-03-20 DIAGNOSIS — E785 Hyperlipidemia, unspecified: Secondary | ICD-10-CM

## 2018-03-20 DIAGNOSIS — R7401 Elevation of levels of liver transaminase levels: Secondary | ICD-10-CM

## 2018-03-20 NOTE — Telephone Encounter (Signed)
Left detailed voicemail

## 2018-03-21 ENCOUNTER — Encounter: Payer: Self-pay | Admitting: Family Medicine

## 2018-03-21 ENCOUNTER — Telehealth: Payer: Self-pay | Admitting: Family Medicine

## 2018-03-21 DIAGNOSIS — J439 Emphysema, unspecified: Secondary | ICD-10-CM

## 2018-03-21 DIAGNOSIS — J432 Centrilobular emphysema: Secondary | ICD-10-CM | POA: Insufficient documentation

## 2018-03-21 HISTORY — DX: Emphysema, unspecified: J43.9

## 2018-03-21 NOTE — Telephone Encounter (Signed)
Patient has a new diagnosis of COPD on her chest xray Please ask her to schedule an appointment to go over the results of her chest CT

## 2018-03-22 NOTE — Telephone Encounter (Signed)
Left detailed voicemail and sent to [facility]

## 2018-03-26 ENCOUNTER — Ambulatory Visit (INDEPENDENT_AMBULATORY_CARE_PROVIDER_SITE_OTHER): Payer: Medicare Other | Admitting: Nurse Practitioner

## 2018-03-26 ENCOUNTER — Encounter: Payer: Self-pay | Admitting: Nurse Practitioner

## 2018-03-26 VITALS — BP 138/90 | HR 80 | Temp 98.0°F | Resp 16 | Ht 60.0 in | Wt 159.2 lb

## 2018-03-26 DIAGNOSIS — R030 Elevated blood-pressure reading, without diagnosis of hypertension: Secondary | ICD-10-CM

## 2018-03-26 DIAGNOSIS — H6981 Other specified disorders of Eustachian tube, right ear: Secondary | ICD-10-CM

## 2018-03-26 DIAGNOSIS — R740 Nonspecific elevation of levels of transaminase and lactic acid dehydrogenase [LDH]: Secondary | ICD-10-CM | POA: Diagnosis not present

## 2018-03-26 DIAGNOSIS — R7989 Other specified abnormal findings of blood chemistry: Secondary | ICD-10-CM | POA: Diagnosis not present

## 2018-03-26 DIAGNOSIS — R748 Abnormal levels of other serum enzymes: Secondary | ICD-10-CM | POA: Diagnosis not present

## 2018-03-26 DIAGNOSIS — E785 Hyperlipidemia, unspecified: Secondary | ICD-10-CM | POA: Diagnosis not present

## 2018-03-26 MED ORDER — LORATADINE 10 MG PO TABS: 10.0000 mg | ORAL_TABLET | Freq: Every day | ORAL | 2 refills | Status: DC | PRN

## 2018-03-26 MED ORDER — FLUTICASONE PROPIONATE 50 MCG/ACT NA SUSP: 1.0000 | Freq: Two times a day (BID) | NASAL | 3 refills | Status: DC | PRN

## 2018-03-26 NOTE — Patient Instructions (Addendum)
- Please take loratadine once daily for the next 2 weeks then as needed - Use the flonase as needed - If having worsening ear pain, any ear drainage or heat come back to be re-evaluated. - If having dizziness you can come back to be evaluated; you can look up more information on the Epley maneuver online    Eustachian Tube Dysfunction The eustachian tube connects the middle ear to the back of the nose. It regulates air pressure in the middle ear by allowing air to move between the ear and nose. It also helps to drain fluid from the middle ear space. When the eustachian tube does not function properly, air pressure, fluid, or both can build up in the middle ear. Eustachian tube dysfunction can affect one or both ears. What are the causes? This condition happens when the eustachian tube becomes blocked or cannot open normally. This may result from:  Ear infections.  Colds and other upper respiratory infections.  Allergies.  Irritation, such as from cigarette smoke or acid from the stomach coming up into the esophagus (gastroesophageal reflux).  Sudden changes in air pressure, such as from descending in an airplane.  Abnormal growths in the nose or throat, such as nasal polyps, tumors, or enlarged tissue at the back of the throat (adenoids).  What increases the risk? This condition may be more likely to develop in people who smoke and people who are overweight. Eustachian tube dysfunction may also be more likely to develop in children, especially children who have:  Certain birth defects of the mouth, such as cleft palate.  Large tonsils and adenoids.  What are the signs or symptoms? Symptoms of this condition may include:  A feeling of fullness in the ear.  Ear pain.  Clicking or popping noises in the ear.  Ringing in the ear.  Hearing loss.  Loss of balance.  Symptoms may get worse when the air pressure around you changes, such as when you travel to an area of high  elevation or fly on an airplane. How is this diagnosed? This condition may be diagnosed based on:  Your symptoms.  A physical exam of your ear, nose, and throat.  Tests, such as those that measure: ? The movement of your eardrum (tympanogram). ? Your hearing (audiometry).  How is this treated? Treatment depends on the cause and severity of your condition. If your symptoms are mild, you may be able to relieve your symptoms by moving air into ("popping") your ears. If you have symptoms of fluid in your ears, treatment may include:  Decongestants.  Antihistamines.  Nasal sprays or ear drops that contain medicines that reduce swelling (steroids).  In some cases, you may need to have a procedure to drain the fluid in your eardrum (myringotomy). In this procedure, a small tube is placed in the eardrum to:  Drain the fluid.  Restore the air in the middle ear space.  Follow these instructions at home:  Take over-the-counter and prescription medicines only as told by your health care provider.  Use techniques to help pop your ears as recommended by your health care provider. These may include: ? Chewing gum. ? Yawning. ? Frequent, forceful swallowing. ? Closing your mouth, holding your nose closed, and gently blowing as if you are trying to blow air out of your nose.  Do not do any of the following until your health care provider approves: ? Travel to high altitudes. ? Fly in airplanes. ? Work in a Pension scheme manager or  room. ? Scuba dive.  Keep your ears dry. Dry your ears completely after showering or bathing.  Do not smoke.  Keep all follow-up visits as told by your health care provider. This is important. Contact a health care provider if:  Your symptoms do not go away after treatment.  Your symptoms come back after treatment.  You are unable to pop your ears.  You have: ? A fever. ? Pain in your ear. ? Pain in your head or neck. ? Fluid draining from your  ear.  Your hearing suddenly changes.  You become very dizzy.  You lose your balance. This information is not intended to replace advice given to you by your health care provider. Make sure you discuss any questions you have with your health care provider. Document Released: 07/09/2015 Document Revised: 11/18/2015 Document Reviewed: 07/01/2014 Elsevier Interactive Patient Education  2018 [employer].  _________________ Your goal blood pressure is less than 140 mmHg on top and 90 on the bottom. Your blood pressure today was at the upper limi Try to follow the DASH guidelines (DASH stands for Dietary Approaches to Stop Hypertension) Try to limit the sodium in your diet.  Ideally, consume less than 1.5 grams (less than 1,500mg ) per day. Do not add salt when cooking or at the table.  Check the sodium amount on labels when shopping, and choose items lower in sodium when given a choice. Avoid or limit foods that already contain a lot of sodium. Eat a diet rich in fruits and vegetables and whole grains.

## 2018-03-26 NOTE — Progress Notes (Addendum)
Name: Allison Pham   MRN: [id number]    DOB: May 22, 1961   Date:03/26/2018       Progress Note  Subjective  Chief Complaint  Chief Complaint  Patient presents with  . Otitis Media    HPI  Patient presents with mild right ear pain and muffled sounds started this morning. No dizziness, tinnitus, drainage, heat. States feels it is full.  No sore throat, sinus pressure, eye pain, red watery eyes. Does have seasonal allergies, hasnt used Claritin or flonase this season yet.     Patient Active Problem List   Diagnosis Date Noted  . Emphysema of lung ([place]) 03/21/2018  . Obesity (BMI 30.0-34.9) 02/11/2018  . Screening for colon cancer   . Benign neoplasm of ascending colon   . Benign neoplasm of transverse colon   . Polyp of sigmoid colon   . Dizziness 03/21/2017  . Screen for colon cancer 03/21/2017  . Abdominal discomfort 05/30/2016  . Smoking history 05/15/2016  . Elevated serum GGT level 05/15/2016  . Abnormal mammogram of right breast 11/01/2015  . Dermatitis 10/29/2015  . Spondylosis of cervical spine 07/08/2015  . Preventative health care 05/29/2015  . Breast cancer screening 05/25/2015  . Varicose vein of leg 05/25/2015  . Essential hypertension, benign 04/28/2015  . Hip pain 04/28/2015  . Medication monitoring encounter 04/28/2015  . Constipation 04/28/2015  . Dyslipidemia 04/28/2015  . Cervical spine pain 04/23/2015    Past Medical History:  Diagnosis Date  . Anxiety   . Emphysema of lung ([place]) 03/21/2018  . GERD (gastroesophageal reflux disease)   . Hypercholesteremia   . Hypertension   . Tick bite   . Vertigo   . Wears dentures    partial upper    Past Surgical History:  Procedure Laterality Date  . ABDOMINAL HYSTERECTOMY     Partial  . COLONOSCOPY WITH PROPOFOL N/A 05/10/2017   Procedure: COLONOSCOPY WITH PROPOFOL;  Surgeon: [name], MD;  Location: [facility];  Service: Endoscopy;  Laterality: N/A;  . HEMORRHOID SURGERY    .  POLYPECTOMY N/A 05/10/2017   Procedure: POLYPECTOMY;  Surgeon: [name], MD;  Location: [facility];  Service: Endoscopy;  Laterality: N/A;    Social History   Tobacco Use  . Smoking status: Former Smoker    Packs/day: 1.00    Years: 38.00    Pack years: 38.00    Types: Cigarettes    Last attempt to quit: 09/13/2015    Years since quitting: 2.5  . Smokeless tobacco: Never Used  Substance Use Topics  . Alcohol use: Never    Alcohol/week: 0.0 standard drinks    Frequency: Never    Comment: rare - holidays     Current Outpatient Medications:  .  busPIRone (BUSPAR) 5 MG tablet, Take 0.5-1 tablets (2.5-5 mg total) by mouth 3 (three) times daily., Disp: 270 tablet, Rfl: 0 .  cholecalciferol (VITAMIN D) 1000 units tablet, Take 1,000 Units by mouth daily., Disp: , Rfl:  .  cyclobenzaprine (FLEXERIL) 5 MG tablet, Take 1 tablet (5 mg total) by mouth at bedtime as needed for muscle spasms. Do not drive while taking as can cause drowsiness., Disp: 10 tablet, Rfl: 0 .  ezetimibe (ZETIA) 10 MG tablet, Take 1 tablet (10 mg total) by mouth daily., Disp: 90 tablet, Rfl: 3 .  fluticasone (FLONASE) 50 MCG/ACT nasal spray, Place 1 spray into both nostrils 2 (two) times daily as needed., Disp: 1 g, Rfl: 3 .  gabapentin (NEURONTIN) 300 MG  capsule, Take 1 capsule (300 mg total) by mouth at bedtime., Disp: 90 capsule, Rfl: 0 .  loratadine (CLARITIN) 10 MG tablet, Take 1 tablet (10 mg total) by mouth daily as needed for allergies., Disp: 30 tablet, Rfl: 2 .  metoprolol succinate (TOPROL-XL) 25 MG 24 hr tablet, Take 1 tablet (25 mg total) by mouth daily., Disp: 30 tablet, Rfl: 5  Allergies  Allergen Reactions  . Penicillins Rash    ROS   No other specific complaints in a complete review of systems (except as listed in HPI above).  Objective  Vitals:   03/26/18 0913 03/26/18 0914  BP: 140/90 138/90  Pulse: 80   Resp: 16   Temp: 98 F (36.7 C)   TempSrc: Oral   SpO2: 95%    Weight: 159 lb 3.2 oz (72.2 kg)   Height: 5' (1.524 m)     Body mass index is 31.09 kg/m.  Nursing Note and Vital Signs reviewed.  Physical Exam  Constitutional: She appears well-developed and well-nourished.  HENT:  Head: Normocephalic and atraumatic.  Right Ear: Hearing, external ear and ear canal normal. Tympanic membrane is not perforated and not erythematous. A middle ear effusion is present.  Left Ear: Hearing, external ear and ear canal normal. No foreign bodies. Tympanic membrane is not perforated and not erythematous.  Nose: Nose normal. Right sinus exhibits no maxillary sinus tenderness and no frontal sinus tenderness. Left sinus exhibits no maxillary sinus tenderness and no frontal sinus tenderness.  Mouth/Throat: Uvula is midline, oropharynx is clear and moist and mucous membranes are normal. No oropharyngeal exudate.  Eyes: Pupils are equal, round, and reactive to light. Conjunctivae and EOM are normal. Right eye exhibits no discharge. Left eye exhibits no discharge.  Neck: Normal range of motion.  Cardiovascular: Normal rate.  Pulmonary/Chest: Effort normal.  Lymphadenopathy:    She has no cervical adenopathy.  Neurological: She is alert.  Skin: Skin is warm and dry. No rash noted.  Psychiatric: She has a normal mood and affect. Her behavior is normal. Judgment and thought content normal.       No results found for this or any previous visit (from the past 48 hour(s)).  Assessment & Plan  1. Eustachian tube dysfunction, right Discussed s&s requiring re-evaluation  - fluticasone (FLONASE) 50 MCG/ACT nasal spray; Place 1 spray into both nostrils 2 (two) times daily as needed.  Dispense: 1 g; Refill: 3 - loratadine (CLARITIN) 10 MG tablet; Take 1 tablet (10 mg total) by mouth daily as needed for allergies.  Dispense: 30 tablet; Refill: 2  2. Elevated blood pressure reading DASH guidelines given

## 2018-03-29 LAB — HEPATIC FUNCTION PANEL
AG Ratio: 1.8 (calc) (ref 1.0–2.5)
ALKALINE PHOSPHATASE (APISO): 102 U/L (ref 33–130)
ALT: 26 U/L (ref 6–29)
AST: 22 U/L (ref 10–35)
Albumin: 4.4 g/dL (ref 3.6–5.1)
BILIRUBIN INDIRECT: 0.5 mg/dL (ref 0.2–1.2)
BILIRUBIN TOTAL: 0.6 mg/dL (ref 0.2–1.2)
Bilirubin, Direct: 0.1 mg/dL (ref 0.0–0.2)
Globulin: 2.4 g/dL (calc) (ref 1.9–3.7)
TOTAL PROTEIN: 6.8 g/dL (ref 6.1–8.1)

## 2018-03-29 LAB — LIPID PANEL
Cholesterol: 272 mg/dL — ABNORMAL HIGH (ref <200)
HDL: 48 mg/dL — ABNORMAL LOW (ref >50)
LDL Cholesterol (Calc): 194 mg/dL (calc) — ABNORMAL HIGH
NON-HDL CHOLESTEROL (CALC): 224 mg/dL — AB (ref <130)
TRIGLYCERIDES: 143 mg/dL (ref <150)
Total CHOL/HDL Ratio: 5.7 (calc) — ABNORMAL HIGH (ref <5.0)

## 2018-03-29 LAB — PROTIME-INR
INR: 1
Prothrombin Time: 10.2 s (ref 9.0–11.5)

## 2018-03-29 LAB — ANA,IFA RA DIAG PNL W/RFLX TIT/PATN
ANA: NEGATIVE
Cyclic Citrullin Peptide Ab: <16 UNITS
Rhuematoid fact SerPl-aCnc: <14 IU/mL (ref <14)

## 2018-03-29 LAB — HEPATITIS PANEL, ACUTE
HEP B C IGM: NONREACTIVE
HEP B S AG: NONREACTIVE
HEP C AB: NONREACTIVE
Hep A IgM: NONREACTIVE
SIGNAL TO CUT-OFF: 0.01 (ref <1.00)

## 2018-03-29 LAB — FERRITIN: Ferritin: 219 ng/mL (ref 16–232)

## 2018-03-29 LAB — ALPHA-1-ANTITRYPSIN: A-1 Antitrypsin, Ser: 144 mg/dL (ref 83–199)

## 2018-04-19 ENCOUNTER — Other Ambulatory Visit: Payer: Self-pay

## 2018-04-19 NOTE — Patient Outreach (Signed)
[facility]) Care Management  04/19/2018  LEYLA SOLIZ 31-Mar-1961 [id number]   Medication Adherence call to Mrs. Doloras Tellado left a message for patient to call back patient is due on Lisinopril 10 mg. Mrs. Kirley is showing past due under [place].   [place] Management Direct Dial [phone]  Fax [phone] [email or web address]

## 2018-04-30 ENCOUNTER — Other Ambulatory Visit: Payer: Self-pay

## 2018-04-30 IMAGING — US US ABDOMEN LIMITED
1 series · 14 of 25 positions shown · non-contrast
Comparison: None.

CLINICAL DATA: Elevated liver function tests

EXAM:
US ABDOMEN LIMITED - RIGHT UPPER QUADRANT

[Series 1: us abdomen limited · 0.15mm/px · 14 of 67 slices shown]
[im 1/67]
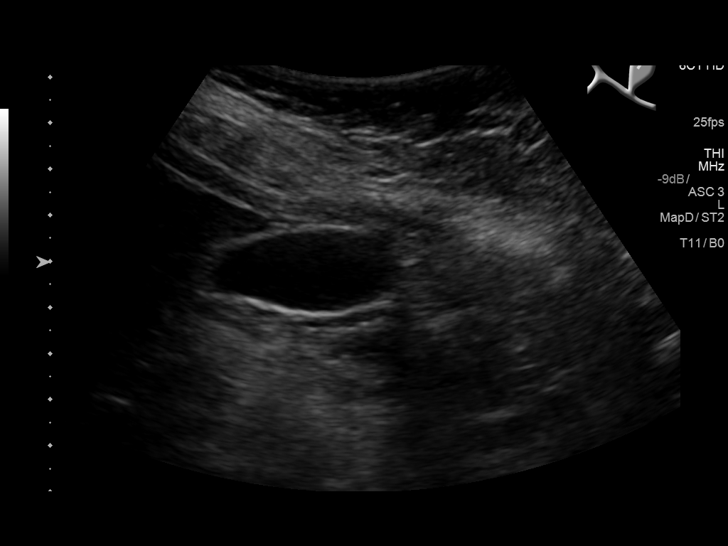
[im 6/67]
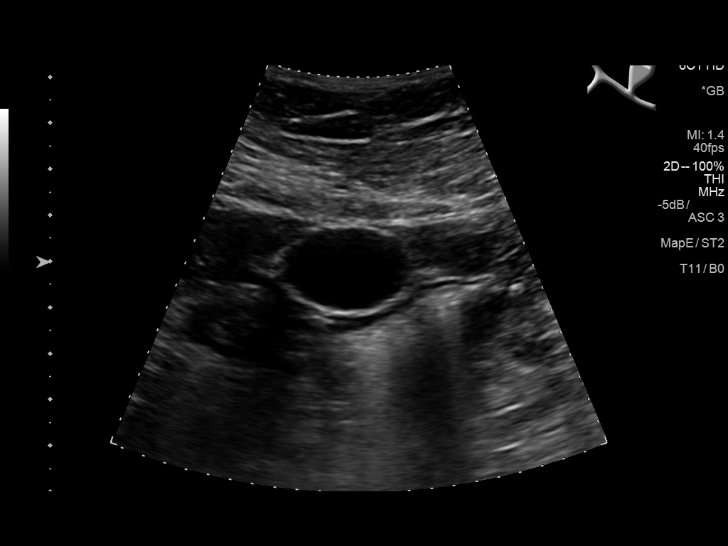
[im 12/67]
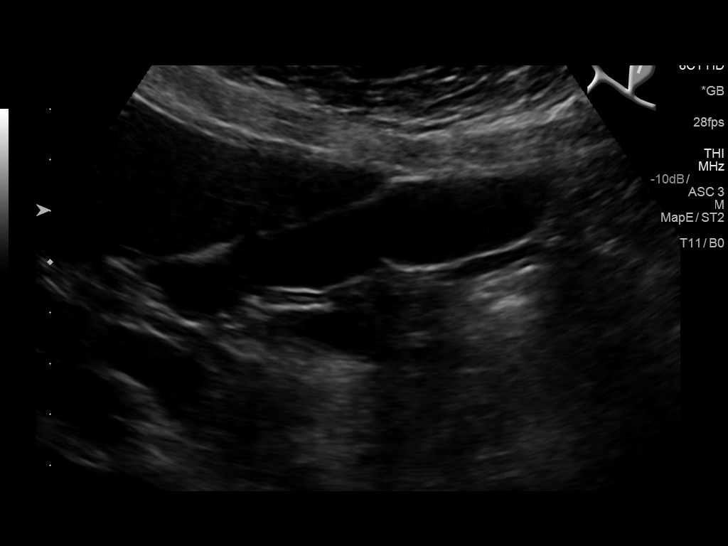
[im 17/67]
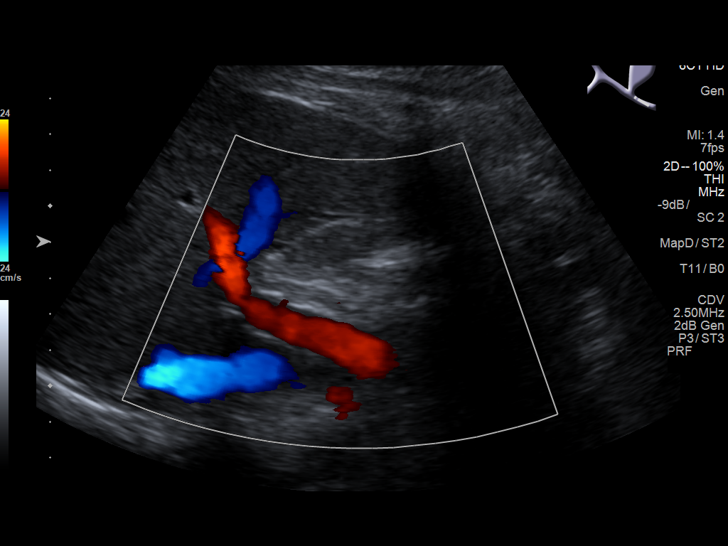
[im 23/67]
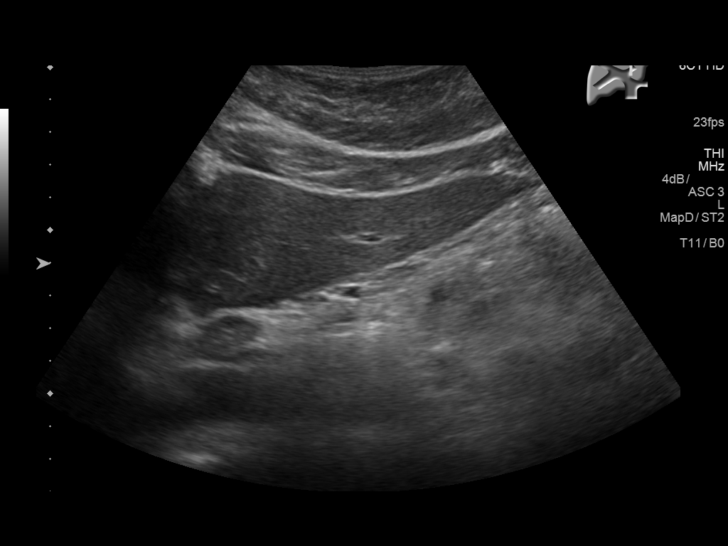
[im 25/67]
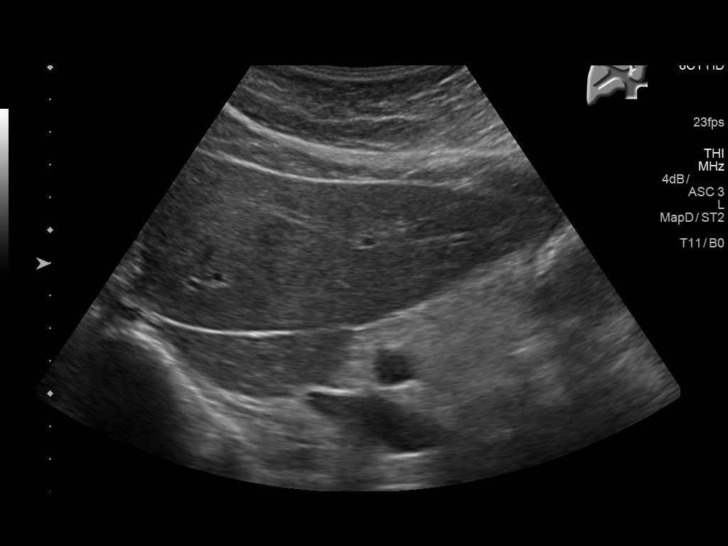
[im 31/67]
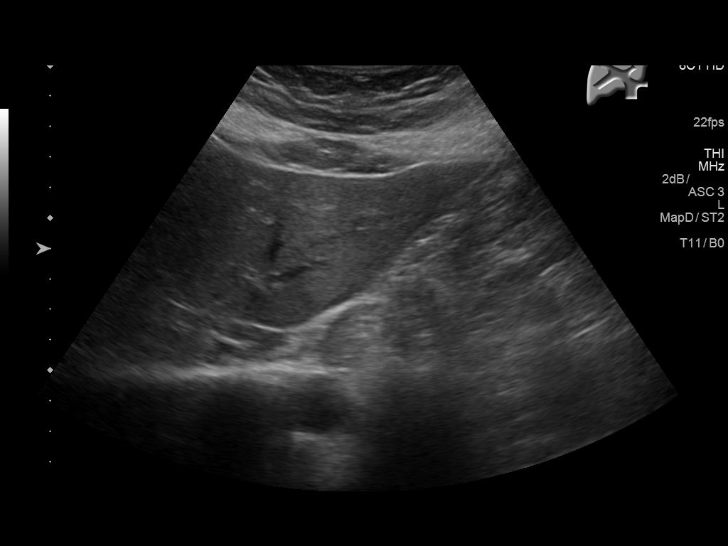
[im 36/67]
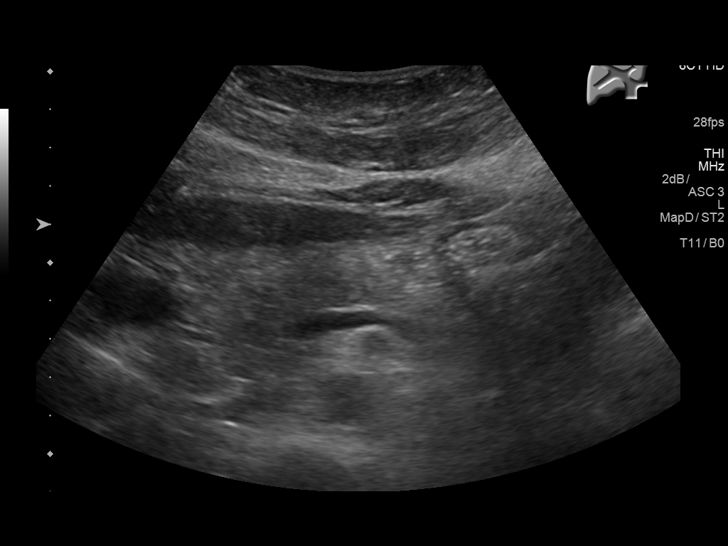
[im 42/67]
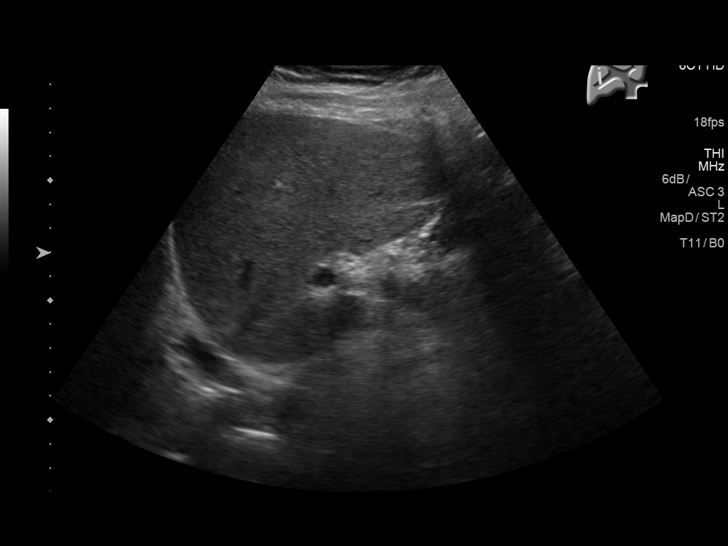
[im 45/67]
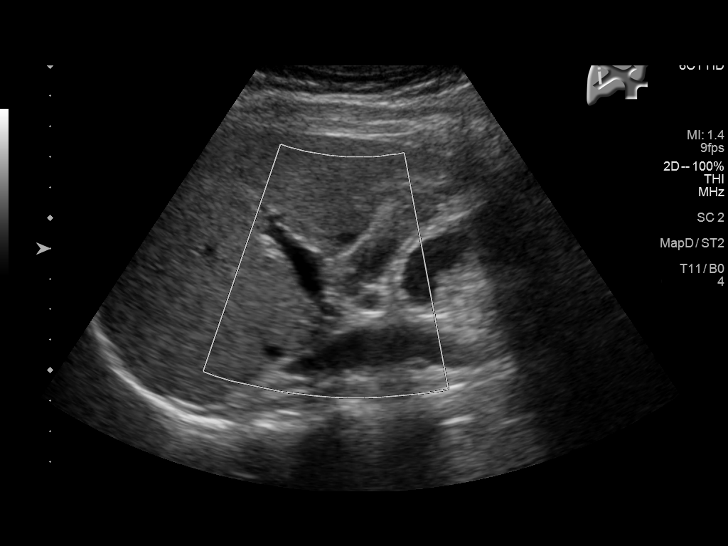
[im 50/67]
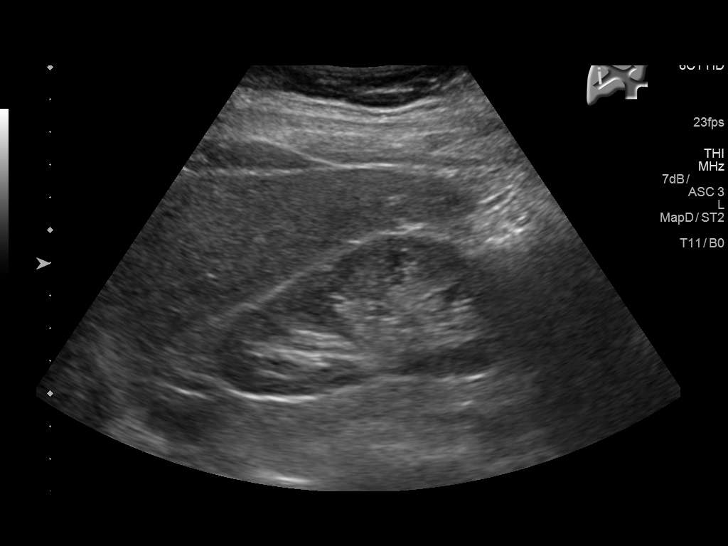
[im 56/67]
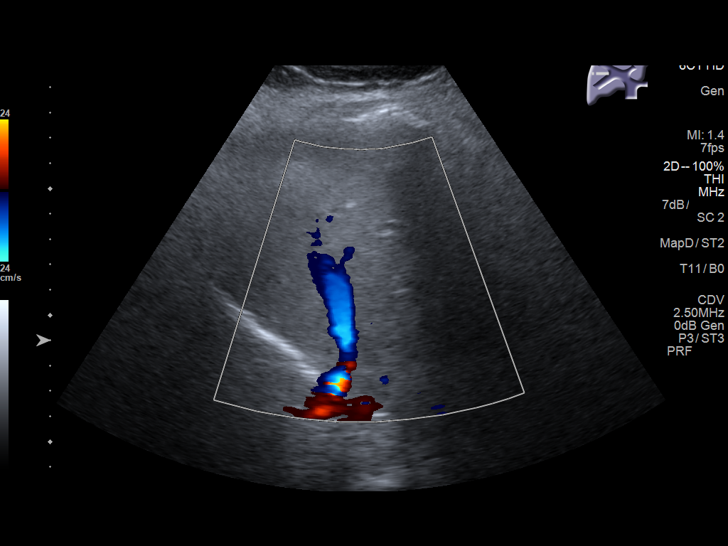
[im 61/67]
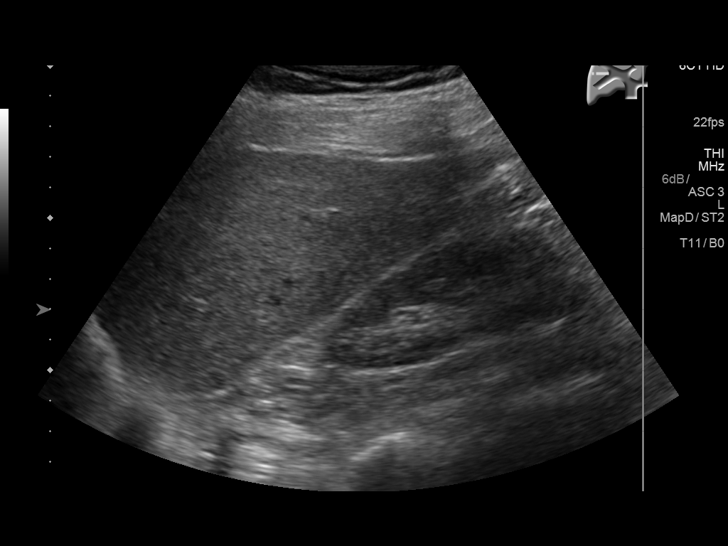
[im 67/67]
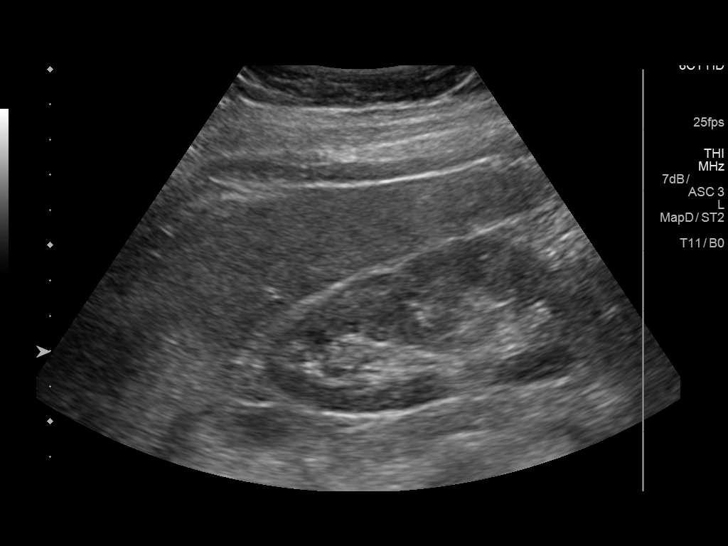

[14 of 25 positions shown; findings below may reference images not displayed]

FINDINGS: Gallbladder:

The gallbladder is visualized and no gallstones are noted. There is
no pain over the gallbladder with compression.

Common bile duct:

Diameter: The common bile duct is normal measuring 4 mm in diameter.

Liver:

The liver has a normal echogenic pattern. No focal hepatic
abnormality is seen
IMPRESSION: Negative limited ultrasound of the right upper quadrant.

## 2018-04-30 NOTE — Patient Outreach (Signed)
[facility]) Care Management  04/30/2018  JARRETT CHICOINE 08/04/1960 [id number]   Medication Adherence call to Mrs. Analyah Mcconnon left a message for patient to call back patient is due on Lisinopril 10 mg. Mrs. Dominy is showing past due under [place].   [place] Management Direct Dial [phone]  Fax [phone] [email or web address]

## 2018-05-08 IMAGING — US US PELVIS COMPLETE
1 series · 13 of 25 positions shown · non-contrast
Comparison: Right upper quadrant ultrasound 05/23/2016.

CLINICAL DATA: 55-year-old female with right side pelvic pain.
Symptoms for 4 months. Prior hysterectomy, the patient still has
ovaries. Postmenopausal not on hormone replacement therapy. Initial
encounter.



[Series 1: us pelvis complete · 0.22mm/px · 13 of 44 slices shown]
[im 1/44]
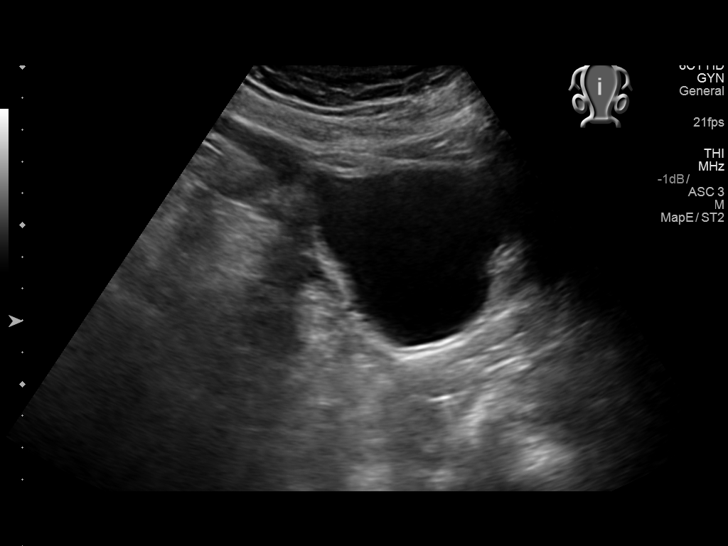
[im 4/44]
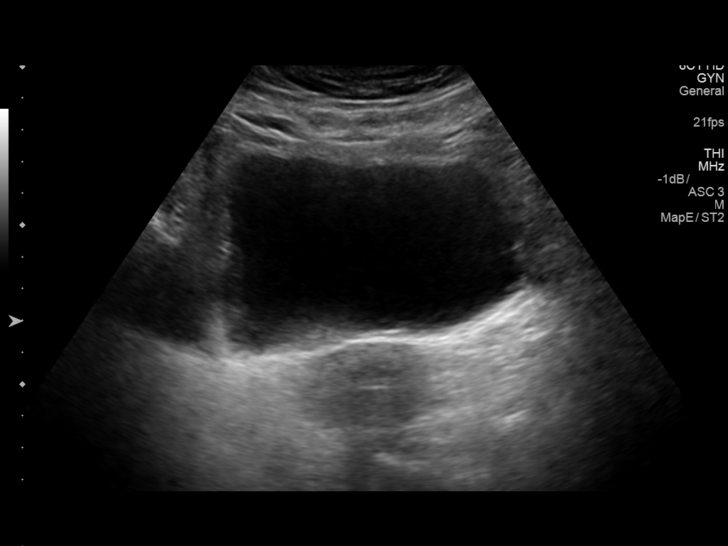
[im 8/44]
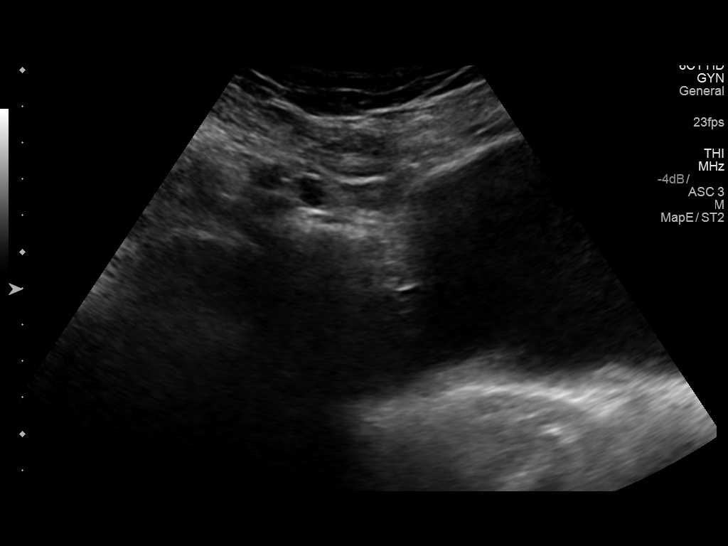
[im 11/44]
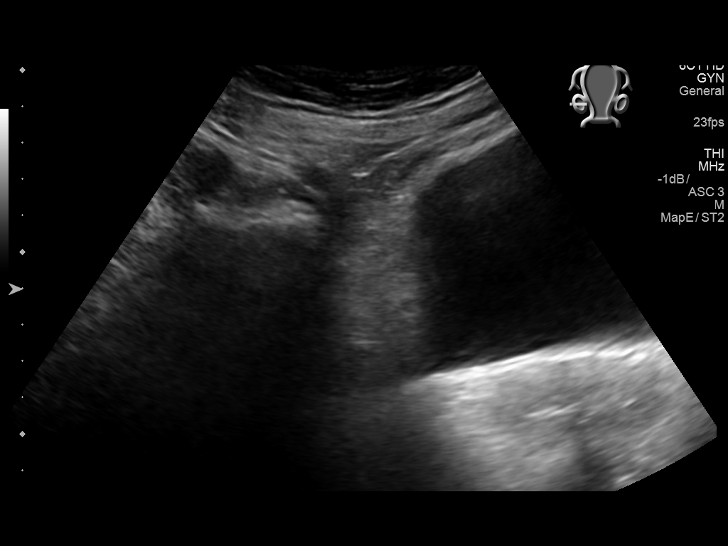
[im 15/44]
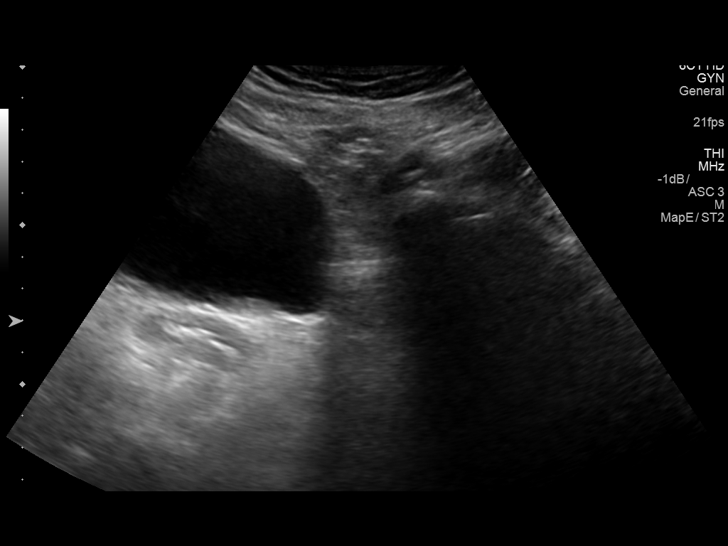
[im 18/44]
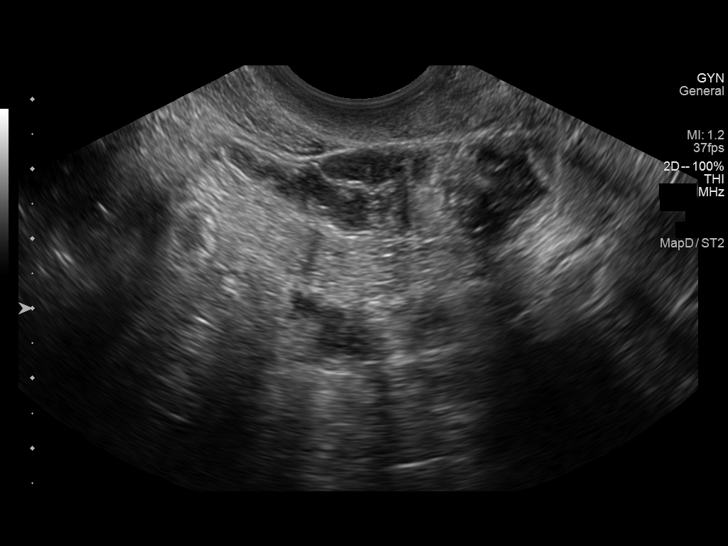
[im 22/44]
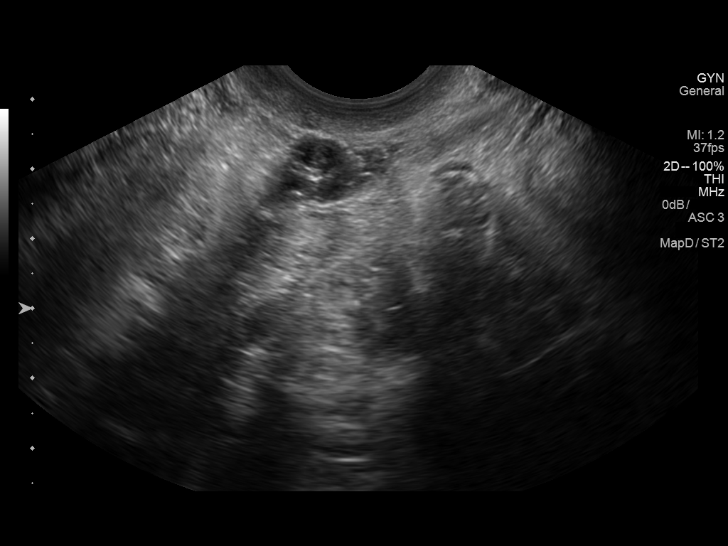
[im 26/44]
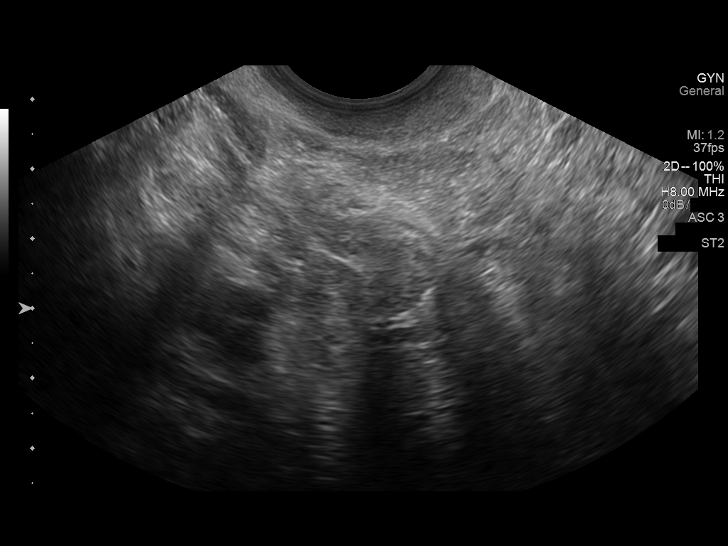
[im 29/44]
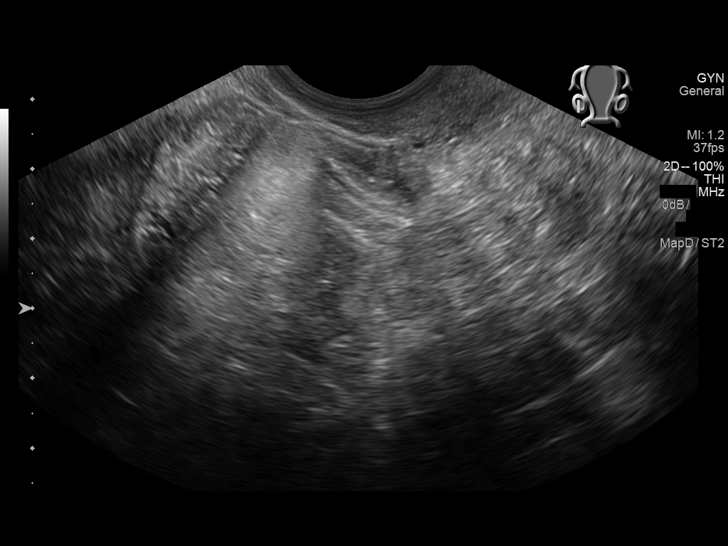
[im 33/44]
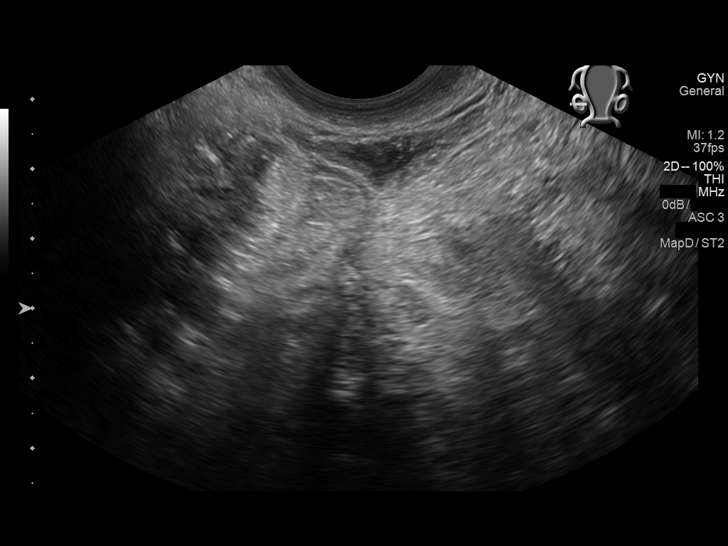
[im 36/44]
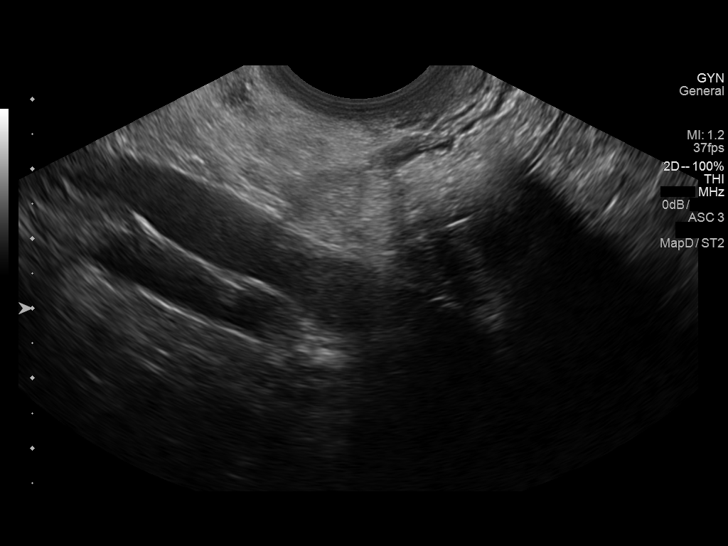
[im 40/44]
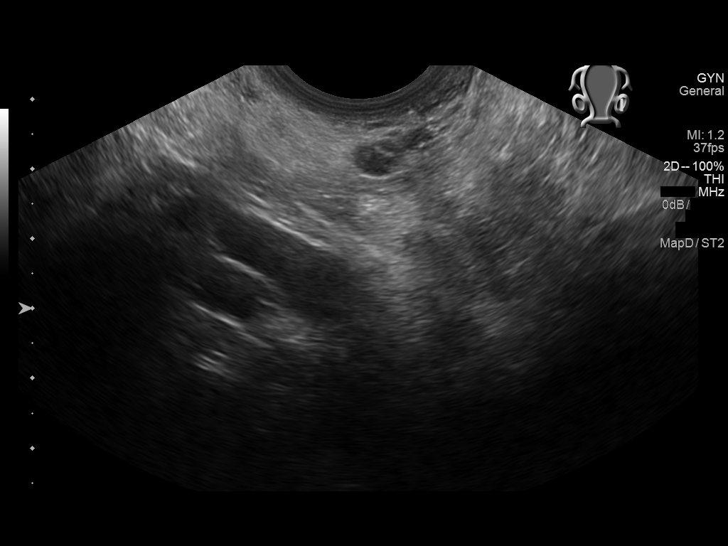
[im 44/44]
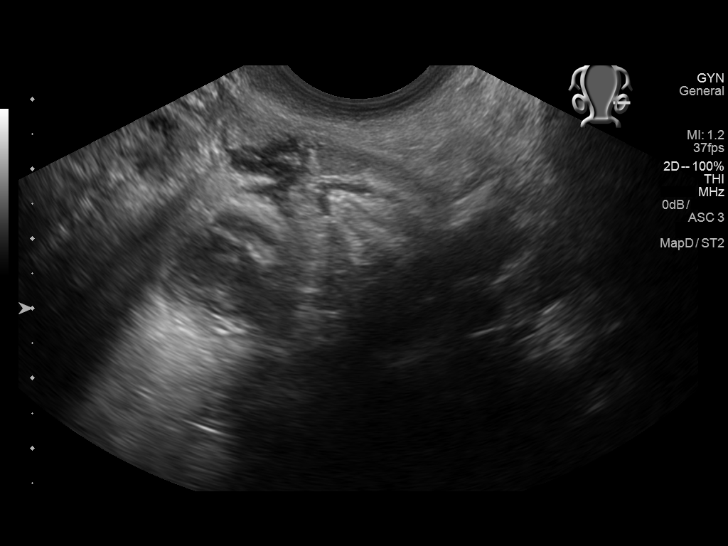

[13 of 25 positions shown; findings below may reference images not displayed]

FINDINGS: Uterus

Measurements: Surgically absent.

Endometrium

Thickness: Surgically absent.

Right ovary

Measurements: Not visualized despite trans abdominal and
transvaginal attempts. Normal appearing bowel loops and vascular
structures are identified in the right hemipelvis.

Left ovary

Measurements: Not visualized despite trans abdominal and
transvaginal attempts. Normal appearing bowel loops and vascular
structures are identified in the left hemipelvis.

Other findings

No pelvic free fluid.  Unremarkable urinary bladder.
IMPRESSION: Ovaries could not be identified. No pelvic mass, fluid, or
abnormality is evident by ultrasound.

## 2018-05-14 ENCOUNTER — Encounter: Payer: Self-pay | Admitting: Family Medicine

## 2018-05-14 ENCOUNTER — Ambulatory Visit (INDEPENDENT_AMBULATORY_CARE_PROVIDER_SITE_OTHER): Payer: Medicare Other | Admitting: Family Medicine

## 2018-05-14 VITALS — BP 130/82 | HR 80 | Temp 98.4°F | Ht 60.0 in | Wt 161.6 lb

## 2018-05-14 DIAGNOSIS — R5383 Other fatigue: Secondary | ICD-10-CM | POA: Diagnosis not present

## 2018-05-14 DIAGNOSIS — I10 Essential (primary) hypertension: Secondary | ICD-10-CM | POA: Diagnosis not present

## 2018-05-14 DIAGNOSIS — R198 Other specified symptoms and signs involving the digestive system and abdomen: Secondary | ICD-10-CM

## 2018-05-14 DIAGNOSIS — E785 Hyperlipidemia, unspecified: Secondary | ICD-10-CM

## 2018-05-14 DIAGNOSIS — N644 Mastodynia: Secondary | ICD-10-CM

## 2018-05-14 DIAGNOSIS — E559 Vitamin D deficiency, unspecified: Secondary | ICD-10-CM | POA: Diagnosis not present

## 2018-05-14 DIAGNOSIS — R6889 Other general symptoms and signs: Secondary | ICD-10-CM | POA: Diagnosis not present

## 2018-05-14 DIAGNOSIS — M79622 Pain in left upper arm: Secondary | ICD-10-CM

## 2018-05-14 DIAGNOSIS — N951 Menopausal and female climacteric states: Secondary | ICD-10-CM | POA: Diagnosis not present

## 2018-05-14 NOTE — Patient Instructions (Addendum)
Try to limit saturated fats in your diet (bologna, hot dogs, barbeque, cheeseburgers, hamburgers, steak, bacon, sausage, cheese, etc.) and get more fresh fruits, vegetables, and whole grains  Check out the information at familydoctor.org entitled "Nutrition for Weight Loss: What You Need to Know about Fad Diets" Try to lose between 1-2 pounds per week by taking in fewer calories and burning off more calories You can succeed by limiting portions, limiting foods dense in calories and fat, becoming more active, and drinking 8 glasses of water a day (64 ounces) Don't skip meals, especially breakfast, as skipping meals may alter your metabolism Do not use over-the-counter weight loss pills or gimmicks that claim rapid weight loss A healthy BMI (or body mass index) is between 18.5 and 24.9 You can calculate your ideal BMI at the [place] website [employer]fr  We'll get labs today Please call [phone] to schedule your imaging tests (diagnostic mammogram and ultrasounds of your breasts) Please wait 2-3 days after the order has been placed to call and get your test scheduled

## 2018-05-14 NOTE — Assessment & Plan Note (Signed)
Well controlled today.

## 2018-05-14 NOTE — Progress Notes (Signed)
BP 130/82   Pulse 80   Temp 98.4 F (36.9 C) (Oral)   Ht 5' (1.524 m)   Wt 161 lb 9.6 oz (73.3 kg)   SpO2 94%   BMI 31.56 kg/m    Subjective:    Patient ID: Allison Pham, female    DOB: 1960-12-19, 57 y.o.   MRN: [id number]  HPI: DAMESHA LAWLER is a 57 y.o. female  Chief Complaint  Patient presents with  . Follow-up  . Arm Pain    under right    HPI  Patient is here for a few things  She is having something under the left armpit; something busted, like a cyst; she was scared; did not drain to her knowledge; still hurts badly; no fevers; nothing similar  She feels good about her blood pressure  She has been to her ENT specialist; she thinks she has something draining from her ear to her lymp nodes under the armpit; her left ear always bothers her; she was on five day medicine from [place], [place] in October; that helped some; note reviewed; middle ear effusion present; hearing is good; still using nasal spray; not using loratidine very much at all  She has very high cholesterol; I sent her a MyChart message in March 27, 2018; she thinks it is that high because she went to the beach and she ate crab legs; high cholesterol runs in the fmaily; she kept a diary for a week, but didn't bring it; more chicken than hamburgers she says; she cooks [place], but trying to do better; she is going to the gym in January; more meat in [place] meals  She has a little depression; sits around a lot; she does not want medicine; no thoughts of self-harm or harming others; she gets really cold, cold intolerance; wonders about checking   She wonders about checking hormones for menopause; trouble sleeping; few hot flashes; some depression; she gets some swelling of her upper abd just at times; no pelvic pain; just had colonoscopy not long ago; no pain  Depression screen [facility] 2/9 05/14/2018 02/11/2018 02/01/2018 02/01/2018 10/19/2017  Decreased Interest 1 1 0 0 0  Down, Depressed, Hopeless 1 1  0 0 0  PHQ - 2 Score 2 2 0 0 0  Altered sleeping 2 0 0 - -  Tired, decreased energy 2 2 2  - -  Change in appetite 0 0 0 - -  Feeling bad or failure about yourself  0 0 0 - -  Trouble concentrating 0 0 0 - -  Moving slowly or fidgety/restless 0 0 0 - -  Suicidal thoughts 0 0 0 - -  PHQ-9 Score 6 4 2  - -  Difficult doing work/chores Not difficult at all Somewhat difficult Not difficult at all - -   Fall Risk  05/14/2018 03/26/2018 02/11/2018 02/01/2018 10/19/2017  Falls in the past year? 0 No No No No  Number falls in past yr: 0 - - - -    Relevant past medical, surgical, family and social history reviewed Past Medical History:  Diagnosis Date  . Anxiety   . Emphysema of lung ([place]) 03/21/2018  . GERD (gastroesophageal reflux disease)   . Hypercholesteremia   . Hypertension   . Tick bite   . Vertigo   . Wears dentures    partial upper   Past Surgical History:  Procedure Laterality Date  . ABDOMINAL HYSTERECTOMY     Partial  . COLONOSCOPY WITH PROPOFOL N/A 05/10/2017  Procedure: COLONOSCOPY WITH PROPOFOL;  Surgeon: [name], MD;  Location: [place];  Service: Endoscopy;  Laterality: N/A;  . HEMORRHOID SURGERY    . POLYPECTOMY N/A 05/10/2017   Procedure: POLYPECTOMY;  Surgeon: [name], MD;  Location: [place];  Service: Endoscopy;  Laterality: N/A;   Family History  Problem Relation Age of Onset  . Cancer Mother        Colon  . Hypertension Mother   . Hypertension Father   . Heart attack Father   . Heart disease Father   . Hypertension Sister   . COPD Sister   . Hypertension Brother   . Heart disease Brother   . Cancer Maternal Aunt   . Cancer Maternal Uncle   . Diabetes Neg Hx   . Stroke Neg Hx   . Breast cancer Neg Hx    Social History   Tobacco Use  . Smoking status: Former Smoker    Packs/day: 1.00    Years: 38.00    Pack years: 38.00    Types: Cigarettes    Last attempt to quit: 09/13/2015    Years since quitting: 2.6  .  Smokeless tobacco: Never Used  Substance Use Topics  . Alcohol use: Never    Alcohol/week: 0.0 standard drinks    Frequency: Never    Comment: rare - holidays  . Drug use: No     Office Visit from 05/14/2018 in [facility]  AUDIT-C Score  0      Interim medical history since last visit reviewed. Allergies and medications reviewed  Review of Systems Per HPI unless specifically indicated above     Objective:    BP 130/82   Pulse 80   Temp 98.4 F (36.9 C) (Oral)   Ht 5' (1.524 m)   Wt 161 lb 9.6 oz (73.3 kg)   SpO2 94%   BMI 31.56 kg/m   Wt Readings from Last 3 Encounters:  05/14/18 161 lb 9.6 oz (73.3 kg)  03/26/18 159 lb 3.2 oz (72.2 kg)  03/14/18 150 lb (68 kg)    Physical Exam  Constitutional: She appears well-developed and well-nourished. No distress.  Weight gain noted  HENT:  Head: Normocephalic and atraumatic.  Right Ear: Tympanic membrane and ear canal normal. No drainage.  Left Ear: Tympanic membrane and ear canal normal. No drainage.  Mouth/Throat: No posterior oropharyngeal edema or posterior oropharyngeal erythema.  Eyes: EOM are normal. No scleral icterus.  Neck: No thyromegaly present.  Cardiovascular: Normal rate, regular rhythm and normal heart sounds.  No murmur heard. Pulmonary/Chest: Effort normal and breath sounds normal. No respiratory distress. She has no wheezes. Right breast exhibits no inverted nipple, no mass, no nipple discharge, no skin change and no tenderness. Left breast exhibits tenderness (tender along far left lateral breast, as well as in the left axilla; maneuver with flexed muscles increased discomfort deep left axilla). Left breast exhibits no inverted nipple, no mass, no nipple discharge and no skin change. No breast swelling or bleeding. Breasts are asymmetrical (left lateral breast tender, perhaps firmer far lateral (versus tensing up)).    Abdominal: Soft. Bowel sounds are normal. She exhibits no  distension.  Musculoskeletal: She exhibits no edema.  Lymphadenopathy:    She has no cervical adenopathy.  Neurological: She is alert.  Skin: Skin is warm and dry. She is not diaphoretic. No pallor.  Psychiatric: She has a normal mood and affect. Her behavior is normal. Judgment and thought content normal.  Results for orders placed or performed in visit on 03/20/18  ANA,IFA RA Diag Pnl w/rflx Tit/Patn  Result Value Ref Range   Anti Nuclear Antibody(ANA) NEGATIVE NEGATIVE   Rhuematoid fact SerPl-aCnc <62 <22 IU/mL   Cyclic Citrullin Peptide Ab <16 UNITS   INTERPRETATION    INR/PT  Result Value Ref Range   INR 1.0    Prothrombin Time 10.2 9.0 - 11.5 sec  Alpha-1-antitrypsin  Result Value Ref Range   A-1 Antitrypsin, Ser 144 83 - 199 mg/dL  Ferritin  Result Value Ref Range   Ferritin 219 16 - 232 ng/mL  Hepatitis panel, acute  Result Value Ref Range   Hep A IgM NON-REACTIVE NON-REACTI   Hepatitis B Surface Ag NON-REACTIVE NON-REACTI   Hep B C IgM NON-REACTIVE NON-REACTI   Hepatitis C Ab NON-REACTIVE NON-REACTI   SIGNAL TO CUT-OFF 0.01 <1.00  Hepatic function panel  Result Value Ref Range   Total Protein 6.8 6.1 - 8.1 g/dL   Albumin 4.4 3.6 - 5.1 g/dL   Globulin 2.4 1.9 - 3.7 g/dL (calc)   AG Ratio 1.8 1.0 - 2.5 (calc)   Total Bilirubin 0.6 0.2 - 1.2 mg/dL   Bilirubin, Direct 0.1 0.0 - 0.2 mg/dL   Indirect Bilirubin 0.5 0.2 - 1.2 mg/dL (calc)   Alkaline phosphatase (APISO) 102 33 - 130 U/L   AST 22 10 - 35 U/L   ALT 26 6 - 29 U/L  Lipid panel  Result Value Ref Range   Cholesterol 272 (H) <200 mg/dL   HDL 48 (L) >50 mg/dL   Triglycerides 143 <150 mg/dL   LDL Cholesterol (Calc) 194 (H) mg/dL (calc)   Total CHOL/HDL Ratio 5.7 (H) <5.0 (calc)   Non-HDL Cholesterol (Calc) 224 (H) <130 mg/dL (calc)      Assessment & Plan:   Problem List Items Addressed This Visit      Cardiovascular and Mediastinum   Essential hypertension, benign - Primary    Well-controlled  today        Other   Dyslipidemia    Check lipids today; try to limit saturated fats; continue zetia      Relevant Orders   Cardio IQ (R) Advanced Lipid Panel    Other Visit Diagnoses    Hot flashes due to menopause       patient wishes to check LH/FSH   Relevant Orders   T3, free   T4, free   TSH   FSH/LH   Cold intolerance       check thyroid tests   Relevant Orders   T3, free   T4, free   TSH   Other fatigue       check vitamin B12 and D   Relevant Orders   VITAMIN D 25 Hydroxy (Vit-D Deficiency, Fractures)   Vitamin B12   Breast pain in female       ordering diag mammo and [place]   Relevant Orders   MM DIAG BREAST TOMO BILATERAL   US BREAST LTD UNI LEFT INC AXILLA   US BREAST LTD UNI RIGHT INC AXILLA   Axillary pain, left       diag mammo and [place]   Relevant Orders   MM DIAG BREAST TOMO BILATERAL   US BREAST LTD UNI LEFT INC AXILLA   US BREAST LTD UNI RIGHT INC AXILLA   Abdominal symptoms       patient denies abd pain, pelvic pain; she'll try less gas-producing foods and let me know if continuing  Follow up plan: Return in about 4 weeks (around 06/11/2018) for follow-up visit with Dr. Sanda Klein; re-examine breasts.  An after-visit summary was printed and given to the patient at [facility].  Please see the patient instructions which may contain other information and recommendations beyond what is mentioned above in the assessment and plan.  No orders of the defined types were placed in this encounter.   Orders Placed This Encounter  Procedures  . MM DIAG BREAST TOMO BILATERAL  . US BREAST LTD UNI LEFT INC AXILLA  . US BREAST LTD UNI RIGHT INC AXILLA  . VITAMIN D 25 Hydroxy (Vit-D Deficiency, Fractures)  . Vitamin B12  . T3, free  . T4, free  . TSH  . Cardio IQ (R) Advanced Lipid Panel  . FSH/LH

## 2018-05-14 NOTE — Assessment & Plan Note (Signed)
Check lipids today; try to limit saturated fats; continue zetia

## 2018-05-15 ENCOUNTER — Other Ambulatory Visit: Payer: Self-pay | Admitting: Family Medicine

## 2018-05-15 MED ORDER — VITAMIN D (ERGOCALCIFEROL) 1.25 MG (50000 UNIT) PO CAPS: 50000.0000 [IU] | ORAL_CAPSULE | ORAL | 1 refills | Status: DC

## 2018-05-15 NOTE — Progress Notes (Signed)
Start vit D Rx weekly x 8 weeks, then OTC 1000 iu daily

## 2018-05-17 ENCOUNTER — Other Ambulatory Visit: Payer: Self-pay | Admitting: Nurse Practitioner

## 2018-05-20 LAB — VITAMIN D 25 HYDROXY (VIT D DEFICIENCY, FRACTURES): VIT D 25 HYDROXY: 18 ng/mL — AB (ref 30–100)

## 2018-05-20 LAB — CARDIO IQ(R) ADVANCED LIPID PANEL
Apolipoprotein B: 154 mg/dL — ABNORMAL HIGH
CHOL/HDL RATIO: 5.5 calc — AB (ref <5.0)
Cholesterol: 259 mg/dL — ABNORMAL HIGH (ref <200)
HDL: 47 mg/dL — ABNORMAL LOW (ref >50)
LDL CHOLESTEROL (CALC): 176 mg/dL — AB (ref <100)
LDL Large: 4931 nmol/L (ref 3966–11938)
LDL MEDIUM: 409 nmol/L (ref 122–498)
LDL Particle Number: 2162 nmol/L — ABNORMAL HIGH (ref 732–2035)
LDL Peak Size: 208 Angstrom — ABNORMAL LOW (ref >=217)
LDL Small: 631 nmol/L — ABNORMAL HIGH (ref 75–452)
Lipoprotein (a): 110 nmol/L — ABNORMAL HIGH (ref <75)
Non-HDL Cholesterol (Calc): 212 mg/dL (calc) — ABNORMAL HIGH (ref <130)
TRIGLYCERIDES: 200 mg/dL — AB (ref <150)

## 2018-05-20 LAB — FSH/LH
FSH: 102.5 m[IU]/mL
LH: 36.8 m[IU]/mL

## 2018-05-20 LAB — TSH: TSH: 1.36 mIU/L (ref 0.40–4.50)

## 2018-05-20 LAB — VITAMIN B12: Vitamin B-12: 689 pg/mL (ref 200–1100)

## 2018-05-20 LAB — T3, FREE: T3 FREE: 3.1 pg/mL (ref 2.3–4.2)

## 2018-05-20 LAB — T4, FREE: Free T4: 1.2 ng/dL (ref 0.8–1.8)

## 2018-05-22 LAB — HM DIABETES EYE EXAM

## 2018-05-28 ENCOUNTER — Ambulatory Visit
Admission: RE | Admit: 2018-05-28 | Discharge: 2018-05-28 | Disposition: A | Discharge status: Home / Self Care | Payer: Medicare Other | Source: Ambulatory Visit | Attending: Family Medicine | Admitting: Family Medicine

## 2018-05-28 DIAGNOSIS — N6489 Other specified disorders of breast: Secondary | ICD-10-CM | POA: Diagnosis not present

## 2018-05-28 DIAGNOSIS — R922 Inconclusive mammogram: Secondary | ICD-10-CM | POA: Diagnosis not present

## 2018-05-28 DIAGNOSIS — M79622 Pain in left upper arm: Secondary | ICD-10-CM

## 2018-05-28 DIAGNOSIS — N644 Mastodynia: Secondary | ICD-10-CM

## 2018-06-11 ENCOUNTER — Encounter: Payer: Self-pay | Admitting: Family Medicine

## 2018-06-11 ENCOUNTER — Ambulatory Visit (INDEPENDENT_AMBULATORY_CARE_PROVIDER_SITE_OTHER): Payer: Medicare Other | Admitting: Family Medicine

## 2018-06-11 VITALS — BP 128/84 | HR 76 | Temp 98.0°F | Ht 60.0 in | Wt 165.0 lb

## 2018-06-11 DIAGNOSIS — E669 Obesity, unspecified: Secondary | ICD-10-CM | POA: Diagnosis not present

## 2018-06-11 DIAGNOSIS — R740 Nonspecific elevation of levels of transaminase and lactic acid dehydrogenase [LDH]: Secondary | ICD-10-CM

## 2018-06-11 DIAGNOSIS — R922 Inconclusive mammogram: Secondary | ICD-10-CM

## 2018-06-11 DIAGNOSIS — E785 Hyperlipidemia, unspecified: Secondary | ICD-10-CM | POA: Diagnosis not present

## 2018-06-11 DIAGNOSIS — R7401 Elevation of levels of liver transaminase levels: Secondary | ICD-10-CM

## 2018-06-11 DIAGNOSIS — E559 Vitamin D deficiency, unspecified: Secondary | ICD-10-CM

## 2018-06-11 DIAGNOSIS — M79622 Pain in left upper arm: Secondary | ICD-10-CM | POA: Diagnosis not present

## 2018-06-11 MED ORDER — ATORVASTATIN CALCIUM 20 MG PO TABS: 20.0000 mg | ORAL_TABLET | Freq: Every day | ORAL | 0 refills | Status: DC

## 2018-06-11 NOTE — Progress Notes (Signed)
BP 128/84   Pulse 76   Temp 98 F (36.7 C) (Oral)   Ht 5' (1.524 m)   Wt 165 lb (74.8 kg)   SpO2 98%   BMI 32.22 kg/m    Subjective:    Patient ID: Allison Pham, female    DOB: 06/08/1961, 57 y.o.   MRN: [id number]  HPI: Allison Pham is a 57 y.o. female  Chief Complaint  Patient presents with  . Follow-up  . Obesity    weight loss med    HPI  Here for f/u Had bilateral mammograms, as well as US of the left side Breasts were dense It does still hurt under the axilla; pain is hard to describe, like if she squeezed or pinched She thinks has an ingrown hair there; does not get red or swollen or hot or hard Checking breasts every month She thinks her mother's sisters (2) have had breast cancer  She has obesity; she is interested in something to help her lose weight She thinks she is gaining weight because she is going through menopause; current weight is 163 pounds and she wants to get to about 140 pounds; that would give a BMI of 27.3 She is going to the gym in January; she cannot go yet; the [place] won't take her insurance now but they will take it in January I asked what her plans are for eating; she won't to eat so much when she starts losing; she'll try to stay active more Maybe eat healthier She only eats once a day She snacks sometimes; it varies when I asked; chips but does not eat many; it's going to be hard  She went to the eye doctor; when he checked her eyes he saw something and thought it was from her cholesterol; she is supposed to see a GI doctor to see about getting back on a statin; however, she never went; she had her labs done again before going to see GI and the recheck was normal; she eats hamburgers sometimes, but tries to drain the grease out of it; pork chops; sometimes steaks; not much cheese; not many eggs  Lab Results  Component Value Date   CHOL 259 (H) 05/14/2018   HDL 47 (L) 05/14/2018   LDLCALC 176 (H) 05/14/2018   TRIG 200 (H) 05/14/2018    CHOLHDL 5.5 (H) 05/14/2018   Vitamin D deficiency; she misunderstood and took the vit D 50k pills daily until she ran out  Depression screen [facility] 2/9 06/11/2018 06/11/2018 05/14/2018 02/11/2018 02/01/2018  Decreased Interest - 1 1 1  0  Down, Depressed, Hopeless - 1 1 1  0  PHQ - 2 Score - 2 2 2  0  Altered sleeping - 1 2 0 0  Tired, decreased energy - 1 2 2 2   Change in appetite - 0 0 0 0  Feeling bad or failure about yourself  - 0 0 0 0  Trouble concentrating - 0 0 0 0  Moving slowly or fidgety/restless - 0 0 0 0  Suicidal thoughts 0 1 0 0 0  PHQ-9 Score - 5 6 4 2   Difficult doing work/chores - Somewhat difficult Not difficult at all Somewhat difficult Not difficult at all  MD note: patient denies suicidal ideation; CMA says that should have been a zero, not a one  Fall Risk  06/11/2018 05/14/2018 03/26/2018 02/11/2018 02/01/2018  Falls in the past year? 0 0 No No No  Number falls in past yr: - 0 - - -  Relevant past medical, surgical, family and social history reviewed Past Medical History:  Diagnosis Date  . Anxiety   . Emphysema of lung ([place]) 03/21/2018  . GERD (gastroesophageal reflux disease)   . Hypercholesteremia   . Hypertension   . Tick bite   . Vertigo   . Wears dentures    partial upper   Past Surgical History:  Procedure Laterality Date  . ABDOMINAL HYSTERECTOMY     Partial  . COLONOSCOPY WITH PROPOFOL N/A 05/10/2017   Procedure: COLONOSCOPY WITH PROPOFOL;  Surgeon: [name], MD;  Location: [place];  Service: Endoscopy;  Laterality: N/A;  . HEMORRHOID SURGERY    . POLYPECTOMY N/A 05/10/2017   Procedure: POLYPECTOMY;  Surgeon: [name], MD;  Location: [place];  Service: Endoscopy;  Laterality: N/A;   Family History  Problem Relation Age of Onset  . Cancer Mother        Colon  . Hypertension Mother   . Hypertension Father   . Heart attack Father   . Heart disease Father   . Hypertension Sister   . COPD Sister   .  Hypertension Brother   . Heart disease Brother   . Cancer Maternal Aunt   . Breast cancer Maternal Aunt   . Cancer Maternal Uncle   . Breast cancer Maternal Aunt   . Diabetes Neg Hx   . Stroke Neg Hx    Social History   Tobacco Use  . Smoking status: Former Smoker    Packs/day: 1.00    Years: 38.00    Pack years: 38.00    Types: Cigarettes    Last attempt to quit: 09/13/2015    Years since quitting: 2.7  . Smokeless tobacco: Never Used  Substance Use Topics  . Alcohol use: Never    Alcohol/week: 0.0 standard drinks    Frequency: Never    Comment: rare - holidays  . Drug use: No     Office Visit from 06/11/2018 in [facility]  AUDIT-C Score  0      Interim medical history since last visit reviewed. Allergies and medications reviewed  Review of Systems Per HPI unless specifically indicated above     Objective:    BP 128/84   Pulse 76   Temp 98 F (36.7 C) (Oral)   Ht 5' (1.524 m)   Wt 165 lb (74.8 kg)   SpO2 98%   BMI 32.22 kg/m   Wt Readings from Last 3 Encounters:  06/11/18 165 lb (74.8 kg)  05/14/18 161 lb 9.6 oz (73.3 kg)  03/26/18 159 lb 3.2 oz (72.2 kg)    Physical Exam Constitutional:      Appearance: She is well-developed.  Eyes:     General: No scleral icterus. Cardiovascular:     Rate and Rhythm: Normal rate and regular rhythm.  Pulmonary:     Effort: Pulmonary effort is normal.     Breath sounds: Normal breath sounds.  Chest:     Breasts: Breasts are symmetrical.        Right: Tenderness present. No mass or skin change.        Left: Tenderness present. No mass or skin change.    Psychiatric:        Mood and Affect: Mood is not anxious or depressed.        Behavior: Behavior normal.     Results for orders placed or performed in visit on 05/14/18  VITAMIN D 25 Hydroxy (Vit-D Deficiency, Fractures)  Result  Value Ref Range   Vit D, 25-Hydroxy 18 (L) 30 - 100 ng/mL  Vitamin B12  Result Value Ref Range   Vitamin  B-12 689 200 - 1,100 pg/mL  T3, free  Result Value Ref Range   T3, Free 3.1 2.3 - 4.2 pg/mL  T4, free  Result Value Ref Range   Free T4 1.2 0.8 - 1.8 ng/dL  TSH  Result Value Ref Range   TSH 1.36 0.40 - 4.50 mIU/L  Cardio IQ (R) Advanced Lipid Panel  Result Value Ref Range   Cholesterol 259 (H) <200 mg/dL   HDL 47 (L) >50 mg/dL   Triglycerides 200 (H) <150 mg/dL   LDL Cholesterol (Calc) 176 (H) <100 mg/dL (calc)   Total CHOL/HDL Ratio 5.5 (H) <5.0 calc   Non-HDL Cholesterol (Calc) 212 (H) <130 mg/dL (calc)   LDL Particle Number 2,162 (H) 732 - 2,035 nmol/L   LDL Small 631 (H) 75 - 452 nmol/L   LDL Medium 409 122 - 498 nmol/L   LDL Large 4,931 3,966 - 11,938 nmol/L   LDL Pattern B (A) A Pattern   LDL Peak Size 208.0 (L) > OR = 217 Angstrom   Apolipoprotein B 154 (H) mg/dL   Lipoprotein (a) 110 (H) <75 nmol/L  FSH/LH  Result Value Ref Range   FSH 102.5 mIU/mL   LH 36.8 mIU/mL      Assessment & Plan:   Problem List Items Addressed This Visit      Other   Obesity (BMI 30.0-34.9)    Encouraged weight loss; patient does not sound especially motivated, and has no direction with diet changes; I w ill not prescribe a weight loss medicine at this time, but will instead get her in to see a nutritionist; adequate hydration, move more (start low and build up slowly)      Relevant Orders   Amb ref to Medical Nutrition Therapy-MNT   Dyslipidemia    Taking zetia; since last SGPT was normal, will start back on atorvastatin and then check liver function tests in two weeks      Relevant Medications   atorvastatin (LIPITOR) 20 MG tablet    Other Visit Diagnoses    Axillary pain, left    -  Primary   refer to surgeon; discussed getting an MRI but will have her see surgeon instead right now; dense breasts may occlude visualization of small tumors   Relevant Orders   Ambulatory referral to General Surgery   Dense breast tissue on mammogram       refer to surgeon   Elevated alanine  aminotransferase (ALT) level       now resolved, but will keep a close eye on her and check liver enzymes 2 weeks after starting back on statin   Relevant Orders   Hepatic function panel   Vitamin D deficiency       patient did not take her medicine as directed; I showed her that the vit D was once a WEEK, not once a day; no more Rx to be given; too much toxic       Follow up plan: Return in about 3 months (around 09/10/2018) for follow-up visit with Dr. Sanda Klein.  An after-visit summary was printed and given to the patient at [facility].  Please see the patient instructions which may contain other information and recommendations beyond what is mentioned above in the assessment and plan.  Meds ordered this encounter  Medications  . atorvastatin (LIPITOR) 20 MG tablet  Sig: Take 1 tablet (20 mg total) by mouth at bedtime.    Dispense:  30 tablet    Refill:  0    Orders Placed This Encounter  Procedures  . Hepatic function panel  . Ambulatory referral to General Surgery  . Amb ref to Medical Nutrition Therapy-MNT

## 2018-06-11 NOTE — Assessment & Plan Note (Addendum)
Encouraged weight loss; patient does not sound especially motivated, and has no direction with diet changes; I w ill not prescribe a weight loss medicine at this time, but will instead get her in to see a nutritionist; adequate hydration, move more (start low and build up slowly)

## 2018-06-11 NOTE — Patient Instructions (Addendum)
Please start the atorvastatin Continue ezemitibe (Zetia) Return for non-fasting labs on January 2nd to recheck your liver enzymes  We'll have you see the nutritionist for a diet plan ([place] on hospital campus)  Do start working out at the gym, but start slowly and gradually build up  Try to limit saturated fats in your diet (bologna, hot dogs, barbeque, cheeseburgers, hamburgers, steak, bacon, sausage, cheese, etc.) and get more fresh fruits, vegetables, and whole grains  Check out the information at familydoctor.org entitled "Nutrition for Weight Loss: What You Need to Know about Fad Diets" Try to lose between 1-2 pounds per week by taking in fewer calories and burning off more calories You can succeed by limiting portions, limiting foods dense in calories and fat, becoming more active, and drinking 8 glasses of water a day (64 ounces) Don't skip meals, especially breakfast, as skipping meals may alter your metabolism Do not use over-the-counter weight loss pills or gimmicks that claim rapid weight loss A healthy BMI (or body mass index) is between 18.5 and 24.9 You can calculate your ideal BMI at the [place] website ClubMonetize.fr   Obesity, Adult Obesity is the condition of having too much total body fat. Being overweight or obese means that your weight is greater than what is considered healthy for your body size. Obesity is determined by a measurement called BMI. BMI is an estimate of body fat and is calculated from height and weight. For adults, a BMI of 30 or higher is considered obese. Obesity can eventually lead to other health concerns and major illnesses, including:  Stroke.  Coronary artery disease (CAD).  Type 2 diabetes.  Some types of cancer, including cancers of the colon, breast, uterus, and gallbladder.  Osteoarthritis.  High blood pressure (hypertension).  High cholesterol.  Sleep apnea.  Gallbladder  stones.  Infertility problems.  What are the causes? The main cause of obesity is taking in (consuming) more calories than your body uses for energy. Other factors that contribute to this condition may include:  Being born with genes that make you more likely to become obese.  Having a medical condition that causes obesity. These conditions include: ? Hypothyroidism. ? Polycystic ovarian syndrome (PCOS). ? Binge-eating disorder. ? Cushing syndrome.  Taking certain medicines, such as steroids, antidepressants, and seizure medicines.  Not being physically active (sedentary lifestyle).  Living where there are limited places to exercise safely or buy healthy foods.  Not getting enough sleep.  What increases the risk? The following factors may increase your risk of this condition:  Having a family history of obesity.  Being a woman of African-American descent.  Being a man of Hispanic descent.  What are the signs or symptoms? Having excessive body fat is the main symptom of this condition. How is this diagnosed? This condition may be diagnosed based on:  Your symptoms.  Your medical history.  A physical exam. Your health care provider may measure: ? Your BMI. If you are an adult with a BMI between 25 and less than 30, you are considered overweight. If you are an adult with a BMI of 30 or higher, you are considered obese. ? The distances around your hips and your waist (circumferences). These may be compared to each other to help diagnose your condition. ? Your skinfold thickness. Your health care provider may gently pinch a fold of your skin and measure it.  How is this treated? Treatment for this condition often includes changing your lifestyle. Treatment may include some or  all of the following:  Dietary changes. Work with your health care provider and a dietitian to set a weight-loss goal that is healthy and reasonable for you. Dietary changes may include  eating: ? Smaller portions. A portion size is the amount of a particular food that is healthy for you to eat at one time. This varies from person to person. ? Low-calorie or low-fat options. ? More whole grains, fruits, and vegetables.  Regular physical activity. This may include aerobic activity (cardio) and strength training.  Medicine to help you lose weight. Your health care provider may prescribe medicine if you are unable to lose 1 pound a week after 6 weeks of eating more healthily and doing more physical activity.  Surgery. Surgical options may include gastric banding and gastric bypass. Surgery may be done if: ? Other treatments have not helped to improve your condition. ? You have a BMI of 40 or higher. ? You have life-threatening health problems related to obesity.  Follow these instructions at home:  Eating and drinking   Follow recommendations from your health care provider about what you eat and drink. Your health care provider may advise you to: ? Limit fast foods, sweets, and processed snack foods. ? Choose low-fat options, such as low-fat milk instead of whole milk. ? Eat 5 or more servings of fruits or vegetables every day. ? Eat at home more often. This gives you more control over what you eat. ? Choose healthy foods when you eat out. ? Learn what a healthy portion size is. ? Keep low-fat snacks on hand. ? Avoid sugary drinks, such as soda, fruit juice, iced tea sweetened with sugar, and flavored milk. ? Eat a healthy breakfast.  Drink enough water to keep your urine clear or pale yellow.  Do not go without eating for long periods of time (do not fast) or follow a fad diet. Fasting and fad diets can be unhealthy and even dangerous. Physical Activity  Exercise regularly, as told by your health care provider. Ask your health care provider what types of exercise are safe for you and how often you should exercise.  Warm up and stretch before being active.  Cool  down and stretch after being active.  Rest between periods of activity. Lifestyle  Limit the time that you spend in front of your TV, computer, or video game system.  Find ways to reward yourself that do not involve food.  Limit alcohol intake to no more than 1 drink a day for nonpregnant women and 2 drinks a day for men. One drink equals 12 oz of beer, 5 oz of wine, or 1 oz of hard liquor. General instructions  Keep a weight loss journal to keep track of the food you eat and how much you exercise you get.  Take over-the-counter and prescription medicines only as told by your health care provider.  Take vitamins and supplements only as told by your health care provider.  Consider joining a support group. Your health care provider may be able to recommend a support group.  Keep all follow-up visits as told by your health care provider. This is important. Contact a health care provider if:  You are unable to meet your weight loss goal after 6 weeks of dietary and lifestyle changes. This information is not intended to replace advice given to you by your health care provider. Make sure you discuss any questions you have with your health care provider. Document Released: 07/20/2004 Document Revised: 11/15/2015 Document Reviewed:  03/31/2015 Elsevier Interactive Patient Education  2018 [place] [employer], Adult Staying at a healthy weight is important. When fat builds up in your body, you may become overweight or obese. These conditions put you at greater risk for developing certain health problems, such as heart disease, diabetes, sleeping problems, joint problems, and some cancers. Unhealthy weight gain is often the result of making unhealthy choices in what you eat. It is also a result of not getting enough exercise. You can make changes to your lifestyle to prevent obesity and stay as healthy as possible. What nutrition changes can be made? To maintain a  healthy weight and prevent obesity:  Eat only as much as your body needs. To do this: ? Pay attention to signs that you are hungry or full. Stop eating as soon as you feel full. ? If you feel hungry, try drinking water first. Drink enough water so your urine is clear or pale yellow. ? Eat smaller portions. ? Look at serving sizes on food labels. Most foods contain more than one serving per container. ? Eat the recommended amount of calories for your gender and activity level. While most active people should eat around 2,000 calories per day, if you are trying to lose weight or are not very active, you main need to eat less calories. Talk to your health care provider or dietitian about how many calories you should eat each day.  Choose healthy foods, such as: ? Fruits and vegetables. Try to fill at least half of your plate at each meal with fruits and vegetables. ? Whole grains, such as whole wheat bread, brown rice, and quinoa. ? Lean meats, such as chicken or fish. ? Other healthy proteins, such as beans, eggs, or tofu. ? Healthy fats, such as nuts, seeds, fatty fish, and olive oil. ? Low-fat or fat-free dairy.  Check food labels and avoid food and drinks that: ? Are high in calories. ? Have added sugar. ? Are high in sodium. ? Have saturated fats or trans fats.  Limit how much you eat of the following foods: ? Prepackaged meals. ? Fast food. ? Fried foods. ? Processed meat, such as bacon, sausage, and deli meats. ? Fatty cuts of red meat and poultry with skin.  Cook foods in healthier ways, such as by baking, broiling, or grilling.  When grocery shopping, try to shop around the outside of the store. This helps you buy mostly fresh foods and avoid canned and prepackaged foods.  What lifestyle changes can be made?  Exercise at least 30 minutes 5 or more days each week. Exercising includes brisk walking, yard work, biking, running, swimming, and team sports like basketball and  soccer. Ask your health care provider which exercises are safe for you.  Do not use any products that contain nicotine or tobacco, such as cigarettes and e-cigarettes. If you need help quitting, ask your health care provider.  Limit alcohol intake to no more than 1 drink a day for nonpregnant women and 2 drinks a day for men. One drink equals 12 oz of beer, 5 oz of wine, or 1 oz of hard liquor.  Try to get 7-9 hours of sleep each night. What other changes can be made?  Keep a food and activity journal to keep track of: ? What you ate and how many calories you had. Remember to count sauces, dressings, and side dishes. ? Whether you were active, and what exercises you did. ? Your calorie, weight,  and activity goals.  Check your weight regularly. Track any changes. If you notice you have gained weight, make changes to your diet or activity routine.  Avoid taking weight-loss medicines or supplements. Talk to your health care provider before starting any new medicine or supplement.  Talk to your health care provider before trying any new diet or exercise plan. Why are these changes important? Eating healthy, staying active, and having healthy habits not only help prevent obesity, they also:  Help you to manage stress and emotions.  Help you to connect with friends and family.  Improve your self-esteem.  Improve your sleep.  Prevent long-term health problems.  What can happen if changes are not made? Being obese or overweight can cause you to develop joint or bone problems, which can make it hard for you to stay active or do activities you enjoy. Being obese or overweight also puts stress on your heart and lungs and can lead to health problems like diabetes, heart disease, and some cancers. Where to find more information: Talk with your health care provider or a dietitian about healthy eating and healthy lifestyle choices. You may also find other information through these  resources:  U.[place]. Department of Agriculture MyPlate: FormerBoss.no  American Heart Association: www.heart.org  Centers for Disease Control and Prevention: http://www.wolf.info/  Summary  Staying at a healthy weight is important. It helps prevent certain diseases and health problems, such as heart disease, diabetes, joint problems, sleep disorders, and some cancers.  Being obese or overweight can cause you to develop joint or bone problems, which can make it hard for you to stay active or do activities you enjoy.  You can prevent unhealthy weight gain by eating a healthy diet, exercising regularly, not smoking, limiting alcohol, and getting enough sleep.  Talk with your health care provider or a dietitian for guidance about healthy eating and healthy lifestyle choices. This information is not intended to replace advice given to you by your health care provider. Make sure you discuss any questions you have with your health care provider. Document Released: 06/13/2016 Document Revised: 07/19/2016 Document Reviewed: 07/19/2016 Elsevier Interactive Patient Education  [employer].

## 2018-06-11 NOTE — Assessment & Plan Note (Addendum)
Taking zetia; since last SGPT was normal, will start back on atorvastatin and then check liver function tests in two weeks

## 2018-06-12 ENCOUNTER — Ambulatory Visit: Payer: Self-pay | Admitting: Family Medicine

## 2018-06-12 NOTE — Telephone Encounter (Signed)
Called patient regarding her medications Zetia and Atorvastatin. At the last office visit, Dr. Sanda Klein added atorvastatin to her daily medicines. Pt just wanted to be sure that she should be taken both of these meds. Pt was told per chart, she should be taking both meds and that she has a lab appointment for in 2 weeks to check her lipids. Pt voiced understanding.

## 2018-06-13 ENCOUNTER — Ambulatory Visit: Payer: Self-pay | Admitting: Surgery

## 2018-06-20 ENCOUNTER — Encounter: Payer: Self-pay | Admitting: Family Medicine

## 2018-06-20 DIAGNOSIS — R7989 Other specified abnormal findings of blood chemistry: Secondary | ICD-10-CM | POA: Insufficient documentation

## 2018-06-25 ENCOUNTER — Other Ambulatory Visit: Payer: Self-pay

## 2018-06-25 ENCOUNTER — Encounter: Payer: Self-pay | Admitting: Surgery

## 2018-06-25 ENCOUNTER — Ambulatory Visit (INDEPENDENT_AMBULATORY_CARE_PROVIDER_SITE_OTHER): Payer: Medicare Other | Admitting: Surgery

## 2018-06-25 VITALS — BP 125/85 | HR 72 | Temp 92.7°F | Ht 60.0 in | Wt 165.4 lb

## 2018-06-25 DIAGNOSIS — I781 Nevus, non-neoplastic: Secondary | ICD-10-CM | POA: Diagnosis not present

## 2018-06-25 DIAGNOSIS — M791 Myalgia, unspecified site: Secondary | ICD-10-CM | POA: Diagnosis not present

## 2018-06-25 NOTE — Patient Instructions (Addendum)
Patient is to return to the office as needed.   Call the office with any questions or concerns.    Varicose Veins Varicose veins are veins that have become enlarged, bulged, and twisted. They most often appear in the legs. What are the causes? This condition is caused by damage to the valves in the vein. These valves help blood return to your heart. When they are damaged and they stop working properly, blood may flow backward and back up in the veins near the skin, causing the veins to get larger and appear twisted. The condition can result from any issue that causes blood to back up, like pregnancy, prolonged standing, or obesity. What increases the risk? This condition is more likely to develop in people who are:  On their feet a lot.  Pregnant.  Overweight. What are the signs or symptoms? Symptoms of this condition include:  Bulging, twisted, and bluish veins.  A feeling of heaviness. This may be worse at the end of the day.  Leg pain. This may be worse at the end of the day.  Swelling in the leg.  Changes in skin color over the veins. How is this diagnosed? This condition may be diagnosed based on your symptoms, a physical exam, and an ultrasound test. How is this treated? Treatment for this condition may involve:  Avoiding sitting or standing in one position for long periods of time.  Wearing compression stockings. These stockings help to prevent blood clots and reduce swelling in the legs.  Raising (elevating) the legs when resting.  Losing weight.  Exercising regularly. If you have persistent symptoms or want to improve the way your varicose veins look, you may choose to have a procedure to close the varicose veins off or to remove them. Treatments to close off the veins include:  Sclerotherapy. In this treatment, a solution is injected into a vein to close it off.  Laser treatment. In this treatment, the vein is heated with a laser to close it  off.  Radiofrequency vein ablation. In this treatment, an electrical current produced by radio waves is used to close off the vein. Treatments to remove the veins include:  Phlebectomy. In this treatment, the veins are removed through small incisions made over the veins.  Vein ligation and stripping. In this treatment, incisions are made over the veins. The veins are then removed after being tied (ligated) with stitches (sutures). Follow these instructions at home: Activity  Walk as much as possible. Walking increases blood flow. This helps blood return to the heart and takes pressure off your veins. It also increases your cardiovascular strength.  Follow your health care provider's instructions about exercising.  Do not stand or sit in one position for a long period of time.  Do not sit with your legs crossed.  Rest with your legs raised during the day. General instructions   Follow any diet instructions given to you by your health care provider.  Wear compression stockings as directed by your health care provider. Do not wear other kinds of tight clothing around your legs, pelvis, or waist.  Elevate your legs at night to above the level of your heart.  If you get a cut in the skin over the varicose vein and the vein bleeds: ? Lie down with your leg raised. ? Apply firm pressure to the cut with a clean cloth until the bleeding stops. ? Place a bandage (dressing) on the cut. Contact a health care provider if:  The skin  around your varicose veins starts to break down.  You have pain, redness, tenderness, or hard swelling over a vein.  You are uncomfortable because of pain.  You get a cut in the skin over a varicose vein and it will not stop bleeding. Summary  Varicose veins are veins that have become enlarged, bulged, and twisted. They most often appear in the legs.  This condition is caused by damage to the valves in the vein. These valves help blood return to your  heart.  Treatment for this condition includes frequent movements, wearing compression stockings, losing weight, and exercising regularly. In some cases, procedures are done to close off or remove the veins.  Treatment for this condition may include wearing compression stockings, elevating the legs, losing weight, and engaging in regular activity. In some cases, procedures are done to close off or remove the veins. This information is not intended to replace advice given to you by your health care provider. Make sure you discuss any questions you have with your health care provider. Document Released: 03/22/2005 Document Revised: 07/05/2016 Document Reviewed: 07/05/2016 Elsevier Interactive Patient Education  2019 [place].    Muscle Pain, Adult Muscle pain (myalgia) may be mild or severe. In most cases, the pain lasts only a short time and it goes away without treatment. It is normal to feel some muscle pain after starting a workout program. Muscles that have not been used often will be sore at first. Muscle pain may also be caused by many other things, including:  Overuse or muscle strain, especially if you are not in shape. This is the most common cause of muscle pain.  Injury.  Bruises.  Viruses, such as the flu.  Infectious diseases.  A chronic condition that causes muscle tenderness, fatigue, and headache (fibromyalgia).  A condition, such as lupus, in which the body's disease-fighting system attacks other organs in the body (autoimmune or rheumatologic diseases).  Certain drugs, including ACE inhibitors and statins. To diagnose the cause of your muscle pain, your health care provider will do a physical exam and ask questions about the pain and when it began. If you have not had muscle pain for very long, your health care provider may want to wait before doing much testing. If your muscle pain has lasted a long time, your health care provider may want to run tests right away.  In some cases, this may include tests to rule out certain conditions or illnesses. Treatment for muscle pain depends on the cause. Home care is often enough to relieve muscle pain. Your health care provider may also prescribe anti-inflammatory medicine. Follow these instructions at home: Activity  If overuse is causing your muscle pain: ? Slow down your activities until the pain goes away. ? Do regular, gentle exercises if you are not usually active. ? Warm up before exercising. Stretch before and after exercising. This can help lower the risk of muscle pain.  Do not continue working out if the pain is very bad. Bad pain could mean that you have injured a muscle. Managing pain and discomfort   If directed, apply ice to the sore muscle: ? Put ice in a plastic bag. ? Place a towel between your skin and the bag. ? Leave the ice on for 20 minutes, 2-3 times a day.  You may also alternate between applying ice and applying heat as told by your health care provider. To apply heat, use the heat source that your health care provider recommends, such as a  moist heat pack or a heating pad. ? Place a towel between your skin and the heat source. ? Leave the heat on for 20-30 minutes. ? Remove the heat if your skin turns bright red. This is especially important if you are unable to feel pain, heat, or cold. You may have a greater risk of getting burned. Medicines  Take over-the-counter and prescription medicines only as told by your health care provider.  Do not drive or use heavy machinery while taking prescription pain medicine. Contact a health care provider if:  Your muscle pain gets worse and medicines do not help.  You have muscle pain that lasts longer than 3 days.  You have a rash or fever along with muscle pain.  You have muscle pain after a tick bite.  You have muscle pain while working out, even though you are in good physical condition.  You have redness, soreness, or swelling  along with muscle pain.  You have muscle pain after starting a new medicine or changing the dose of a medicine. Get help right away if:  You have trouble breathing.  You have trouble swallowing.  You have muscle pain along with a stiff neck, fever, and vomiting.  You have severe muscle weakness or cannot move part of your body. This information is not intended to replace advice given to you by your health care provider. Make sure you discuss any questions you have with your health care provider. Document Released: 05/04/2006 Document Revised: 12/31/2015 Document Reviewed: 11/02/2015 Elsevier Interactive Patient Education  2019 [place].     How to Use Compression Stockings Compression stockings are elastic socks that squeeze the legs. They help increase blood flow (circulation) to the legs, decrease swelling in the legs, and reduce the chance of developing blood clots in the lower legs. Compression stockings are often used by people who:  Are recovering from surgery.  Have poor circulation in their legs.  Tend to get blood clots in their legs.  Have bulging (varicose) veins.  Sit or stay in bed for long periods of time. Follow instructions from your health care provider about how and when to wear your compression stockings. How to wear compression stockings Before you put on your compression stockings:  Make sure that they are the correct size and degree of compression. If you do not know your size or required grade of compression, ask your health care provider and follow the manufacturer's instructions that come with the stockings.  Make sure that they are clean, dry, and in good condition.  Check them for rips and tears. Do not put them on if they are ripped or torn. Put your stockings on first thing in the morning, before you get out of bed. Keep them on for as long as your health care provider advises. When you are wearing your stockings:  Keep them as smooth as  possible. Do not allow them to bunch up. It is especially important to prevent the stockings from bunching up around your toes or behind your knees.  Do not roll the stockings downward and leave them rolled down. This can decrease blood flow to your leg.  Change them right away if they become wet or dirty. When you take off your stockings, inspect your legs and feet. Check for:  Open sores.  Red spots.  Swelling. General tips  Do not stop wearing compression stockings without talking to your health care provider first.  Wash your stockings every day with mild detergent in cold  or warm water. Do not use bleach. Air-dry your stockings or dry them in a clothes dryer on low heat. It may be helpful to have two pairs so that you have a pair to wear while the other is being washed.  Replace your stockings every 3-6 months.  If skin moisturizing is part of your treatment plan, apply lotion or cream at night so that your skin will be dry when you put on the stockings in the morning. It is harder to put the stockings on when you have lotion on your legs or feet.  Wear nonskid shoes or slip-resistant socks when walking while wearing compression stockings. Contact a health care provider and remove your stockings if you have:  A feeling of pins and needles in your feet or legs.  Open sores, red spots, or other skin changes on your feet or legs.  Swelling or pain that gets worse. Get help right away if you have:  Numbness or tingling in your lower legs that does not get better right after you take the stockings off.  Toes or feet that are unusually cold or turn a bluish color.  A warm or red area on your leg.  New swelling or soreness in your leg.  Shortness of breath.  Chest pain.  A fast or irregular heartbeat.  Light-headedness.  Dizziness. Summary  Compression stockings are elastic socks that squeeze the legs.  They help increase blood flow (circulation) to the legs,  decrease swelling in the legs, and reduce the chance of developing blood clots in the lower legs.  Follow instructions from your health care provider about how and when to wear your compression stockings.  Do not stop wearing your compression stockings without talking to your health care provider first. This information is not intended to replace advice given to you by your health care provider. Make sure you discuss any questions you have with your health care provider. Document Released: 04/09/2009 Document Revised: 06/14/2017 Document Reviewed: 06/14/2017 Elsevier Interactive Patient Education  2019 [employer].

## 2018-06-25 NOTE — Progress Notes (Signed)
Surgical Clinic History and Physical  Referring provider:  Arnetha Courser, MD [address] [place], [place] [address]  HISTORY OF PRESENT ILLNESS (HPI):  57 y.o. female presents for evaluation of axillary and Left neck pain. Patient reports Left axillary pain x just over 1 month > similar slightly less severe Right axillary pain x 1 month and similarly intermittent Left neck pain x several years. All three sites are intermittent and relieved with NSAID's and massage. She denies any constant axillary pain, redness, or drainage and likewise denies any upper extremity swelling, infections, or trauma. Patient recently underwent diagnostic bilateral mammogram with focused breast and axillary ultrasound. She denies any prior abnormal mammographic findings or biopsies, abnormal of otherwise, and denies any breast mass(es), nipple discharge, or breast pain. Patient also denies any recent fever/chills, N/V, CP, or SOB. Of note, patient complains lastly of LLE spider veins, without lower extremity swelling, though she says she occasionally wears compression stockings for her spider veins without any improvement.  PAST MEDICAL HISTORY (PMH):  Past Medical History:  Diagnosis Date  . Anxiety   . Emphysema of lung ([place]) 03/21/2018  . GERD (gastroesophageal reflux disease)   . Hypercholesteremia   . Hypertension   . Tick bite   . Vertigo   . Wears dentures    partial upper    PAST SURGICAL HISTORY ([place]):  Past Surgical History:  Procedure Laterality Date  . ABDOMINAL HYSTERECTOMY     Partial  . COLONOSCOPY WITH PROPOFOL N/A 05/10/2017   Procedure: COLONOSCOPY WITH PROPOFOL;  Surgeon: [name], MD;  Location: [facility];  Service: Endoscopy;  Laterality: N/A;  . HEMORRHOID SURGERY    . POLYPECTOMY N/A 05/10/2017   Procedure: POLYPECTOMY;  Surgeon: [name], MD;  Location: [facility];  Service: Endoscopy;  Laterality: N/A;    MEDICATIONS:  Prior to Admission  medications   Medication Sig Start Date End Date Taking? Authorizing Provider  atorvastatin (LIPITOR) 20 MG tablet Take 1 tablet (20 mg total) by mouth at bedtime. 06/11/18  Yes Lada, Satira Anis, MD  busPIRone (BUSPAR) 5 MG tablet Take 0.5-1 tablets (2.5-5 mg total) by mouth 3 (three) times daily. 05/17/18  Yes Lada, Satira Anis, MD  cholecalciferol (VITAMIN D) 1000 units tablet Take 1,000 Units by mouth daily.   Yes [provider]  cyclobenzaprine (FLEXERIL) 5 MG tablet Take 1 tablet (5 mg total) by mouth at bedtime as needed for muscle spasms. Do not drive while taking as can cause drowsiness. 07/26/16  Yes Marylene Land, NP  ezetimibe (ZETIA) 10 MG tablet Take 1 tablet (10 mg total) by mouth daily. 02/12/18  Yes Lada, Satira Anis, MD  fluticasone (FLONASE) 50 MCG/ACT nasal spray Place 1 spray into both nostrils 2 (two) times daily as needed. 03/26/18  Yes Poulose, Bethel Born, NP  gabapentin (NEURONTIN) 300 MG capsule Take 1 capsule (300 mg total) by mouth at bedtime. 03/15/18  Yes Lada, Satira Anis, MD  loratadine (CLARITIN) 10 MG tablet Take 1 tablet (10 mg total) by mouth daily as needed for allergies. 03/26/18  Yes Poulose, Bethel Born, NP  metoprolol succinate (TOPROL-XL) 25 MG 24 hr tablet Take 1 tablet (25 mg total) by mouth daily. 03/08/18  Yes Lada, Satira Anis, MD    ALLERGIES:  Allergies  Allergen Reactions  . Penicillins Rash    SOCIAL HISTORY:  Social History   Socioeconomic History  . Marital status: Single    Spouse name: Not on file  . Number of  children: Not on file  . Years of education: Not on file  . Highest education level: Not on file  Occupational History  . Not on file  Social Needs  . Financial resource strain: Not on file  . Food insecurity:    Worry: Not on file    Inability: Not on file  . Transportation needs:    Medical: Not on file    Non-medical: Not on file  Tobacco Use  . Smoking status: Former Smoker    Packs/day: 1.00    Years: 38.00    Pack  years: 38.00    Types: Cigarettes    Last attempt to quit: 09/13/2015    Years since quitting: 2.7  . Smokeless tobacco: Never Used  Substance and Sexual Activity  . Alcohol use: Never    Alcohol/week: 0.0 standard drinks    Frequency: Never    Comment: rare - holidays  . Drug use: No  . Sexual activity: Not Currently    Birth control/protection: Surgical  Lifestyle  . Physical activity:    Days per week: Not on file    Minutes per session: Not on file  . Stress: Not on file  Relationships  . Social connections:    Talks on phone: Not on file    Gets together: Not on file    Attends religious service: Not on file    Active member of club or organization: Not on file    Attends meetings of clubs or organizations: Not on file    Relationship status: Not on file  . Intimate partner violence:    Fear of current or ex partner: Not on file    Emotionally abused: Not on file    Physically abused: Not on file    Forced sexual activity: Not on file  Other Topics Concern  . Not on file  Social History Narrative  . Not on file    The patient currently resides (home / rehab facility / nursing home): Home The patient normally is (ambulatory / bedbound): Ambulatory  FAMILY HISTORY:  Family History  Problem Relation Age of Onset  . Cancer Mother        Colon  . Hypertension Mother   . Hypertension Father   . Heart attack Father   . Heart disease Father   . Hypertension Sister   . COPD Sister   . Hypertension Brother   . Heart disease Brother   . Cancer Maternal Aunt   . Breast cancer Maternal Aunt   . Cancer Maternal Uncle   . Breast cancer Maternal Aunt   . Diabetes Neg Hx   . Stroke Neg Hx     Otherwise negative/non-contributory.  REVIEW OF SYSTEMS:  Constitutional: denies any other weight loss, fever, chills, or sweats  Eyes: denies any other vision changes, history of eye injury  ENT: denies sore throat, hearing problems  Respiratory: denies shortness of breath,  wheezing  Cardiovascular: denies chest pain, palpitations  Gastrointestinal: denies abdominal pain, N/V, or diarrhea Musculoskeletal: denies any other joint pains or cramps  Skin: Denies any other rashes or skin discolorations except as per HPI Neurological: denies any other headache, dizziness, weakness  Psychiatric: Denies any other depression, anxiety   All other review of systems were otherwise negative   VITAL SIGNS:  BP 125/85   Pulse 72   Temp (!) 92.7 F (33.7 C) (Temporal)   Ht 5' (1.524 m)   Wt 165 lb 6.4 oz (75 kg)   SpO2 93%  BMI 32.30 kg/m   PHYSICAL EXAM:  Constitutional:  -- Overweight body habitus  -- Awake, alert, and oriented x3  Eyes:  -- Pupils equally round and reactive to light  -- No scleral icterus  Ear, nose, throat:  -- No jugular venous distension -- No nasal drainage, bleeding Pulmonary:  -- No crackles  -- Equal breath sounds bilaterally -- Breathing non-labored at rest Cardiovascular:  -- S1, S2 present  -- No pericardial rubs Breasts: -- B/L no appreciable mass(es), nipple discharge, or tenderness to palpation -- No erythema, wounds, or fluctuance Gastrointestinal:  -- Abdomen soft, nontender, non-distended, no guarding/rebound  -- No abdominal masses appreciated, pulsatile or otherwise  Musculoskeletal and Integumentary:  -- Wounds or skin discoloration: B/L tenderness to palpation of muscles and tendons, relieved with massage and no appreciable axillary or neck wounds or lymphadenopathy -- Extremities: B/L UE and LE FROM, hands and feet warm, no edema  Neurologic:  -- Motor function: Intact and symmetric -- Sensation: Intact and symmetric  Labs:  CBC Latest Ref Rng & Units 02/11/2018 11/02/2017 03/21/2017  WBC 3.8 - 10.8 Thousand/uL 7.5 6.6 6.6  Hemoglobin 11.7 - 15.5 g/dL 15.1 14.9 15.3  Hematocrit 35.0 - 45.0 % 44.1 43.4 45.0  Platelets 140 - 400 Thousand/uL 214 233 228   CMP Latest Ref Rng & Units 03/26/2018 02/11/2018  11/02/2017  Glucose 65 - 139 mg/dL - 88 85  BUN 7 - 25 mg/dL - 19 17  Creatinine 0.50 - 1.05 mg/dL - 0.80 0.82  Sodium 135 - 146 mmol/L - 142 140  Potassium 3.5 - 5.3 mmol/L - 4.4 4.3  Chloride 98 - 110 mmol/L - 108 104  CO2 20 - 32 mmol/L - 26 28  Calcium 8.6 - 10.4 mg/dL - 9.4 9.7  Total Protein 6.1 - 8.1 g/dL 6.8 6.8 6.8  Total Bilirubin 0.2 - 1.2 mg/dL 0.6 0.4 0.6  Alkaline Phos 39 - 117 IU/L - - -  AST 10 - 35 U/L 22 57(H) 27  ALT 6 - 29 U/L 26 90(H) 30(H)   Imaging studies: Bilateral Diagnostic Mammogram with Focused Ultrasound (05/28/2018) ACR Breast Density Category c: The breast tissue is heterogeneously dense, which may obscure small masses. 1.  No findings of malignancy in either breast. 2. No abnormalities identified at site of tenderness in the left axilla.   Assessment/Plan:  57 y.o. female with intermittent bilateral multifocal muscular-tendenous pain and prominent symptomatic Left leg reticular veins and telangiectasias (spider veins), complicated by co-morbidities including HTN, HLD, COPD, vertigo, GERD, former tobacco abuse (smoking and non-smoking), and generalized anxiety disorder.   - differential diagnoses for axillary and neck pain discussed  - results of recent mammogram with focused ultrasound discussed with patient  - prn NSAID's, massage, ice to reduce any inflammation, and heat to promote vasodilation  - no indication for any surgical intervention at this time, will contact pending upcoming availability of sclerotherapy in our office for spider veins/tenlangiectasias  - return to clinic as needed, instructed to call if any questions or concerns  All of the above recommendations were discussed with the patient, and all of patient's questions were answered to her expressed satisfaction.  Thank you for the opportunity to participate in this patient's care.  -- Marilynne Drivers Rosana Hoes, MD, [place]: [facility] Partnering  for exceptional care. Office: [phone]

## 2018-07-02 ENCOUNTER — Encounter: Payer: Self-pay | Admitting: Family Medicine

## 2018-07-02 ENCOUNTER — Ambulatory Visit (INDEPENDENT_AMBULATORY_CARE_PROVIDER_SITE_OTHER): Payer: Medicare Other | Admitting: Family Medicine

## 2018-07-02 VITALS — BP 122/84 | HR 76 | Temp 97.9°F | Ht 60.0 in | Wt 164.2 lb

## 2018-07-02 DIAGNOSIS — Z5181 Encounter for therapeutic drug level monitoring: Secondary | ICD-10-CM | POA: Diagnosis not present

## 2018-07-02 DIAGNOSIS — E785 Hyperlipidemia, unspecified: Secondary | ICD-10-CM | POA: Diagnosis not present

## 2018-07-02 DIAGNOSIS — H35369 Drusen (degenerative) of macula, unspecified eye: Secondary | ICD-10-CM

## 2018-07-02 DIAGNOSIS — E559 Vitamin D deficiency, unspecified: Secondary | ICD-10-CM | POA: Insufficient documentation

## 2018-07-02 DIAGNOSIS — I10 Essential (primary) hypertension: Secondary | ICD-10-CM

## 2018-07-02 DIAGNOSIS — R0989 Other specified symptoms and signs involving the circulatory and respiratory systems: Secondary | ICD-10-CM

## 2018-07-02 DIAGNOSIS — K146 Glossodynia: Secondary | ICD-10-CM | POA: Diagnosis not present

## 2018-07-02 DIAGNOSIS — K145 Plicated tongue: Secondary | ICD-10-CM

## 2018-07-02 DIAGNOSIS — E669 Obesity, unspecified: Secondary | ICD-10-CM

## 2018-07-02 NOTE — Assessment & Plan Note (Signed)
Found on eye exam; discussed with patient

## 2018-07-02 NOTE — Patient Instructions (Addendum)
Start taking a multiple vitamin containing: vitamin B2, folic acid, Vitamin Y77 and zinc  We'll get labs today We'll get the scan of your carotid Try to limit saturated fats in your diet (bologna, hot dogs, barbeque, cheeseburgers, hamburgers, steak, bacon, sausage, cheese, etc.) and get more fresh fruits, vegetables, and whole grains Start low and build up slowly at the gym Take aspirin daily for heart protection  Obesity, Adult Obesity is the condition of having too much total body fat. Being overweight or obese means that your weight is greater than what is considered healthy for your body size. Obesity is determined by a measurement called BMI. BMI is an estimate of body fat and is calculated from height and weight. For adults, a BMI of 30 or higher is considered obese. Obesity can eventually lead to other health concerns and major illnesses, including:  Stroke.  Coronary artery disease (CAD).  Type 2 diabetes.  Some types of cancer, including cancers of the colon, breast, uterus, and gallbladder.  Osteoarthritis.  High blood pressure (hypertension).  High cholesterol.  Sleep apnea.  Gallbladder stones.  Infertility problems. What are the causes? The main cause of obesity is taking in (consuming) more calories than your body uses for energy. Other factors that contribute to this condition may include:  Being born with genes that make you more likely to become obese.  Having a medical condition that causes obesity. These conditions include: ? Hypothyroidism. ? Polycystic ovarian syndrome (PCOS). ? Binge-eating disorder. ? Cushing syndrome.  Taking certain medicines, such as steroids, antidepressants, and seizure medicines.  Not being physically active (sedentary lifestyle).  Living where there are limited places to exercise safely or buy healthy foods.  Not getting enough sleep. What increases the risk? The following factors may increase your risk of this  condition:  Having a family history of obesity.  Being a woman of African-American descent.  Being a man of Hispanic descent. What are the signs or symptoms? Having excessive body fat is the main symptom of this condition. How is this diagnosed? This condition may be diagnosed based on:  Your symptoms.  Your medical history.  A physical exam. Your health care provider may measure: ? Your BMI. If you are an adult with a BMI between 25 and less than 30, you are considered overweight. If you are an adult with a BMI of 30 or higher, you are considered obese. ? The distances around your hips and your waist (circumferences). These may be compared to each other to help diagnose your condition. ? Your skinfold thickness. Your health care provider may gently pinch a fold of your skin and measure it. How is this treated? Treatment for this condition often includes changing your lifestyle. Treatment may include some or all of the following:  Dietary changes. Work with your health care provider and a dietitian to set a weight-loss goal that is healthy and reasonable for you. Dietary changes may include eating: ? Smaller portions. A portion size is the amount of a particular food that is healthy for you to eat at one time. This varies from person to person. ? Low-calorie or low-fat options. ? More whole grains, fruits, and vegetables.  Regular physical activity. This may include aerobic activity (cardio) and strength training.  Medicine to help you lose weight. Your health care provider may prescribe medicine if you are unable to lose 1 pound a week after 6 weeks of eating more healthily and doing more physical activity.  Surgery. Surgical options may  include gastric banding and gastric bypass. Surgery may be done if: ? Other treatments have not helped to improve your condition. ? You have a BMI of 40 or higher. ? You have life-threatening health problems related to obesity. Follow these  instructions at home:  Eating and drinking   Follow recommendations from your health care provider about what you eat and drink. Your health care provider may advise you to: ? Limit fast foods, sweets, and processed snack foods. ? Choose low-fat options, such as low-fat milk instead of whole milk. ? Eat 5 or more servings of fruits or vegetables every day. ? Eat at home more often. This gives you more control over what you eat. ? Choose healthy foods when you eat out. ? Learn what a healthy portion size is. ? Keep low-fat snacks on hand. ? Avoid sugary drinks, such as soda, fruit juice, iced tea sweetened with sugar, and flavored milk. ? Eat a healthy breakfast.  Drink enough water to keep your urine clear or pale yellow.  Do not go without eating for long periods of time (do not fast) or follow a fad diet. Fasting and fad diets can be unhealthy and even dangerous. Physical Activity  Exercise regularly, as told by your health care provider. Ask your health care provider what types of exercise are safe for you and how often you should exercise.  Warm up and stretch before being active.  Cool down and stretch after being active.  Rest between periods of activity. Lifestyle  Limit the time that you spend in front of your TV, computer, or video game system.  Find ways to reward yourself that do not involve food.  Limit alcohol intake to no more than 1 drink a day for nonpregnant women and 2 drinks a day for men. One drink equals 12 oz of beer, 5 oz of wine, or 1 oz of hard liquor. General instructions  Keep a weight loss journal to keep track of the food you eat and how much you exercise you get.  Take over-the-counter and prescription medicines only as told by your health care provider.  Take vitamins and supplements only as told by your health care provider.  Consider joining a support group. Your health care provider may be able to recommend a support group.  Keep all  follow-up visits as told by your health care provider. This is important. Contact a health care provider if:  You are unable to meet your weight loss goal after 6 weeks of dietary and lifestyle changes. This information is not intended to replace advice given to you by your health care provider. Make sure you discuss any questions you have with your health care provider. Document Released: 07/20/2004 Document Revised: 11/15/2015 Document Reviewed: 03/31/2015 Elsevier Interactive Patient Education  2019 Elsevier Inc.  Preventing Unhealthy [employer], Adult Staying at a healthy weight is important to your overall health. When fat builds up in your body, you may become overweight or obese. Being overweight or obese increases your risk of developing certain health problems, such as heart disease, diabetes, sleeping problems, joint problems, and some types of cancer. Unhealthy weight gain is often the result of making unhealthy food choices or not getting enough exercise. You can make changes to your lifestyle to prevent obesity and stay as healthy as possible. What nutrition changes can be made?   Eat only as much as your body needs. To do this: ? Pay attention to signs that you are hungry or full. Stop  eating as soon as you feel full. ? If you feel hungry, try drinking water first before eating. Drink enough water so your urine is clear or pale yellow. ? Eat smaller portions. Pay attention to portion sizes when eating out. ? Look at serving sizes on food labels. Most foods contain more than one serving per container. ? Eat the recommended number of calories for your gender and activity level. For most active people, a daily total of 2,000 calories is appropriate. If you are trying to lose weight or are not very active, you may need to eat fewer calories. Talk with your health care provider or a diet and nutrition specialist (dietitian) about how many calories you need each day.  Choose healthy  foods, such as: ? Fruits and vegetables. At each meal, try to fill at least half of your plate with fruits and vegetables. ? Whole grains, such as whole-wheat bread, brown rice, and quinoa. ? Lean meats, such as chicken or fish. ? Other healthy proteins, such as beans, eggs, or tofu. ? Healthy fats, such as nuts, seeds, fatty fish, and olive oil. ? Low-fat or fat-free dairy products.  Check food labels, and avoid food and drinks that: ? Are high in calories. ? Have added sugar. ? Are high in sodium. ? Have saturated fats or trans fats.  Cook foods in healthier ways, such as by baking, broiling, or grilling.  Make a meal plan for the week, and shop with a grocery list to help you stay on track with your purchases. Try to avoid going to the grocery store when you are hungry.  When grocery shopping, try to shop around the outside of the store first, where the fresh foods are. Doing this helps you to avoid prepackaged foods, which can be high in sugar, salt (sodium), and fat. What lifestyle changes can be made?   Exercise for 30 or more minutes on 5 or more days each week. Exercising may include brisk walking, yard work, biking, running, swimming, and team sports like basketball and soccer. Ask your health care provider which exercises are safe for you.  Do muscle-strengthening activities, such as lifting weights or using resistance bands, on 2 or more days a week.  Do not use any products that contain nicotine or tobacco, such as cigarettes and e-cigarettes. If you need help quitting, ask your health care provider.  Limit alcohol intake to no more than 1 drink a day for nonpregnant women and 2 drinks a day for men. One drink equals 12 oz of beer, 5 oz of wine, or 1 oz of hard liquor.  Try to get 7-9 hours of sleep each night. What other changes can be made?  Keep a food and activity journal to keep track of: ? What you ate and how many calories you had. Remember to count the calories  in sauces, dressings, and side dishes. ? Whether you were active, and what exercises you did. ? Your calorie, weight, and activity goals.  Check your weight regularly. Track any changes. If you notice you have gained weight, make changes to your diet or activity routine.  Avoid taking weight-loss medicines or supplements. Talk to your health care provider before starting any new medicine or supplement.  Talk to your health care provider before trying any new diet or exercise plan. Why are these changes important? Eating healthy, staying active, and having healthy habits can help you to prevent obesity. Those changes also:  Help you manage stress and emotions.  Help you connect with friends and family.  Improve your self-esteem.  Improve your sleep.  Prevent long-term health problems. What can happen if changes are not made? Being obese or overweight can cause you to develop joint or bone problems, which can make it hard for you to stay active or do activities you enjoy. Being obese or overweight also puts stress on your heart and lungs and can lead to health problems like diabetes, heart disease, and some cancers. Where to find more information Talk with your health care provider or a dietitian about healthy eating and healthy lifestyle choices. You may also find information from:  U.S. Department of Agriculture, MyPlate: FormerBoss.no  American Heart Association: www.heart.org  Centers for Disease Control and Prevention: http://www.wolf.info/ Summary  Staying at a healthy weight is important to your overall health. It helps you to prevent certain diseases and health problems, such as heart disease, diabetes, joint problems, sleep disorders, and some types of cancer.  Being obese or overweight can cause you to develop joint or bone problems, which can make it hard for you to stay active or do activities you enjoy.  You can prevent unhealthy weight gain by eating a healthy diet,  exercising regularly, not smoking, limiting alcohol, and getting enough sleep.  Talk with your health care provider or a dietitian for guidance about healthy eating and healthy lifestyle choices. This information is not intended to replace advice given to you by your health care provider. Make sure you discuss any questions you have with your health care provider. Document Released: 06/13/2016 Document Revised: 03/23/2017 Document Reviewed: 07/19/2016 Elsevier Interactive Patient Education  2019 [employer].

## 2018-07-02 NOTE — Assessment & Plan Note (Addendum)
Very high lipids; likely inherited; proud of patient for being motivated to go to the gym, will start low and build up slowly; she will be changing her diet; check lipids today; continue statin and zetia

## 2018-07-02 NOTE — Assessment & Plan Note (Signed)
Already finished the Rx vit D; now on OTC 1000 iu daily

## 2018-07-02 NOTE — Assessment & Plan Note (Signed)
Patient is going to start working out, slow and build up gradually; changing her diet; offered referral to nutritionist, but she politely declined; "I'll be okay"; hydration encouraged

## 2018-07-02 NOTE — Progress Notes (Signed)
BP 122/84   Pulse 76   Temp 97.9 F (36.6 C)   Ht 5' (1.524 m)   Wt 164 lb 3.2 oz (74.5 kg)   SpO2 97%   BMI 32.07 kg/m    Subjective:    Patient ID: Allison Pham, female    DOB: 01/29/1961, 58 y.o.   MRN: [id number]  HPI: Allison Pham is a 58 y.o. female  Chief Complaint  Patient presents with  . Follow-up    HPI  She went to see Dr. Rosana Hoes about the axillary symptoms, and he thought it was a muscle and not concerned; visit was on Jun 25, 2018  She saw the eye doctor; he found drusen; she has very high cholesterol; I offered to have her see lipid specialist  She had abnormal liver enzymes; no abd pain; no jaundice; LDL and total cholesterol had come down since October; she is going to change her diet and go to the gym; she is going to give up "drinks" or just one drink a day; she'll drink water and go to the gym; more chicken and fish; she thinks that is going to help; on atorvastatin and zetia; she asked if the medicine feels funny, tastes things burned; she got a special mouthwash and gargles with that and it cleans her mouth real good  She has an app on her phone that measures steps and other health measures; 2863 steps average; she says that is going to increase her steps; she signed up for the gym; got up today and trying; [employer]; she will start low and build up gradually  Vitamin D deficiency; took vitamin D Rx but interestingly insurance didn't pay for the 2nd month  About two weeks, coughing with some white phlegm; no fevers; no sore throat; some discomfort along left side neck for a long time  Depression screen [facility] 2/9 07/02/2018 06/11/2018 06/11/2018 05/14/2018 02/11/2018  Decreased Interest 2 - 1 1 1   Down, Depressed, Hopeless 2 - 1 1 1   PHQ - 2 Score 4 - 2 2 2   Altered sleeping 2 - 1 2 0  Tired, decreased energy 2 - 1 2 2   Change in appetite 0 - 0 0 0  Feeling bad or failure about yourself  1 - 0 0 0  Trouble concentrating 0 - 0 0 0  Moving slowly  or fidgety/restless 0 - 0 0 0  Suicidal thoughts 0 0 1 0 0  PHQ-9 Score 9 - 5 6 4   Difficult doing work/chores Somewhat difficult - Somewhat difficult Not difficult at all Somewhat difficult  MD notes: no SI/HI Maybe a little weight-related; taking anxiety medicine and saw psych for her depression; on full disability for her depression "and different things"; she does not see a psychiatrist now, "I'm okay"; she is able to pull herself back up when starting to feel down; she does not want to see psychiatrist right now  Fall Risk  07/02/2018 06/25/2018 06/11/2018 05/14/2018 03/26/2018  Falls in the past year? 0 0 0 0 No  Number falls in past yr: - 0 - 0 -  Injury with Fall? - 0 - - -    Relevant past medical, surgical, family and social history reviewed Past Medical History:  Diagnosis Date  . Anxiety   . Emphysema of lung ([place]) 03/21/2018  . GERD (gastroesophageal reflux disease)   . Hypercholesteremia   . Hypertension   . Tick bite   . Vertigo   . Wears dentures  partial upper   Past Surgical History:  Procedure Laterality Date  . ABDOMINAL HYSTERECTOMY     Partial  . COLONOSCOPY WITH PROPOFOL N/A 05/10/2017   Procedure: COLONOSCOPY WITH PROPOFOL;  Surgeon: [name], MD;  Location: [facility];  Service: Endoscopy;  Laterality: N/A;  . HEMORRHOID SURGERY    . POLYPECTOMY N/A 05/10/2017   Procedure: POLYPECTOMY;  Surgeon: [name], MD;  Location: [facility];  Service: Endoscopy;  Laterality: N/A;   Family History  Problem Relation Age of Onset  . Cancer Mother        Colon  . Hypertension Mother   . Hypertension Father   . Heart attack Father   . Heart disease Father   . Hypertension Sister   . COPD Sister   . Hypertension Brother   . Heart disease Brother   . Cancer Maternal Aunt   . Breast cancer Maternal Aunt   . Cancer Maternal Uncle   . Breast cancer Maternal Aunt   . Diabetes Neg Hx   . Stroke Neg Hx    Social History   Tobacco Use    . Smoking status: Former Smoker    Packs/day: 1.00    Years: 38.00    Pack years: 38.00    Types: Cigarettes    Last attempt to quit: 09/13/2015    Years since quitting: 2.8  . Smokeless tobacco: Never Used  Substance Use Topics  . Alcohol use: Never    Alcohol/week: 0.0 standard drinks    Frequency: Never    Comment: rare - holidays  . Drug use: No     Office Visit from 07/02/2018 in [facility]  AUDIT-C Score  0      Interim medical history since last visit reviewed. Allergies and medications reviewed  Review of Systems Per HPI unless specifically indicated above     Objective:    BP 122/84   Pulse 76   Temp 97.9 F (36.6 C)   Ht 5' (1.524 m)   Wt 164 lb 3.2 oz (74.5 kg)   SpO2 97%   BMI 32.07 kg/m   Wt Readings from Last 3 Encounters:  07/02/18 164 lb 3.2 oz (74.5 kg)  06/25/18 165 lb 6.4 oz (75 kg)  06/11/18 165 lb (74.8 kg)    Physical Exam Constitutional:      General: She is not in acute distress.    Appearance: She is well-developed. She is obese. She is not diaphoretic.  HENT:     Head: Normocephalic and atraumatic.     Right Ear: Tympanic membrane and ear canal normal.     Left Ear: Tympanic membrane and ear canal normal.     Nose: No rhinorrhea.     Mouth/Throat:     Tongue: No lesions.     Pharynx: No posterior oropharyngeal erythema.  Eyes:     General: No scleral icterus. Neck:     Thyroid: No thyromegaly.  Cardiovascular:     Rate and Rhythm: Normal rate and regular rhythm.     Heart sounds: Normal heart sounds. No murmur.  Pulmonary:     Effort: Pulmonary effort is normal. No respiratory distress.     Breath sounds: Normal breath sounds. No wheezing.  Abdominal:     General: Bowel sounds are normal. There is no distension.     Palpations: Abdomen is soft.  Skin:    General: Skin is warm and dry.     Coloration: Skin is not pale.  Neurological:  Mental Status: She is alert.  Psychiatric:        Behavior:  Behavior normal.        Thought Content: Thought content normal.        Judgment: Judgment normal.     Results for orders placed or performed in visit on 06/24/18  HM DIABETES EYE EXAM  Result Value Ref Range   HM Diabetic Eye Exam No Retinopathy No Retinopathy      Assessment & Plan:   Problem List Items Addressed This Visit      Cardiovascular and Mediastinum   Essential hypertension, benign    Well=controllled        Other   Medication monitoring encounter   Relevant Orders   Hepatic function panel   Vitamin D deficiency    Already finished the Rx vit D; now on OTC 1000 iu daily      Obesity (BMI 30.0-34.9)    Patient is going to start working out, slow and build up gradually; changing her diet; offered referral to nutritionist, but she politely declined; "I'll be okay"; hydration encouraged      Dyslipidemia    Very high lipids; likely inherited; proud of patient for being motivated to go to the gym, will start low and build up slowly; she will be changing her diet; check lipids today; continue statin and zetia      Relevant Orders   Lipid panel   US Carotid Duplex Bilateral   Drusen of eye - Primary    Found on eye exam; discussed with patient       Other Visit Diagnoses    Burning tongue       Relevant Orders   Zinc   Vitamin B2(Riboflavin),Plasma   Folate   Tongue fissure       Relevant Orders   Zinc   Vitamin B2(Riboflavin),Plasma   Folate   Left carotid bruit       Relevant Orders   US Carotid Duplex Bilateral       Follow up plan: Return in about 3 months (around 10/01/2018) for follow-up visit with Dr. Sanda Klein.  An after-visit summary was printed and given to the patient at [place].  Please see the patient instructions which may contain other information and recommendations beyond what is mentioned above in the assessment and plan.  No orders of the defined types were placed in this encounter.   Orders Placed This Encounter  Procedures    . US Carotid Duplex Bilateral  . Hepatic function panel  . Lipid panel  . Zinc  . Vitamin B2(Riboflavin),Plasma  . Folate

## 2018-07-02 NOTE — Assessment & Plan Note (Signed)
Well=controllled

## 2018-07-05 ENCOUNTER — Other Ambulatory Visit: Payer: Self-pay | Admitting: Family Medicine

## 2018-07-06 LAB — ZINC: Zinc: 82 ug/dL (ref 60–130)

## 2018-07-06 LAB — HEPATIC FUNCTION PANEL
AG Ratio: 1.7 (calc) (ref 1.0–2.5)
ALT: 62 U/L — AB (ref 6–29)
AST: 36 U/L — ABNORMAL HIGH (ref 10–35)
Albumin: 4.3 g/dL (ref 3.6–5.1)
Alkaline phosphatase (APISO): 119 U/L (ref 33–130)
Bilirubin, Direct: 0.1 mg/dL (ref 0.0–0.2)
Globulin: 2.6 g/dL (calc) (ref 1.9–3.7)
Indirect Bilirubin: 0.5 mg/dL (calc) (ref 0.2–1.2)
Total Bilirubin: 0.6 mg/dL (ref 0.2–1.2)
Total Protein: 6.9 g/dL (ref 6.1–8.1)

## 2018-07-06 LAB — VITAMIN B2(RIBOFLAVIN),PLASMA: VITAMIN B2(RIBOFLAVIN),PLASMA: 7.5 nmol/L (ref 6.2–39.0)

## 2018-07-06 LAB — LIPID PANEL
Cholesterol: 197 mg/dL (ref <200)
HDL: 48 mg/dL — AB (ref >50)
LDL Cholesterol (Calc): 126 mg/dL (calc) — ABNORMAL HIGH
Non-HDL Cholesterol (Calc): 149 mg/dL (calc) — ABNORMAL HIGH (ref <130)
TRIGLYCERIDES: 122 mg/dL (ref <150)
Total CHOL/HDL Ratio: 4.1 (calc) (ref <5.0)

## 2018-07-06 LAB — FOLATE: Folate: 8.1 ng/mL

## 2018-07-08 ENCOUNTER — Other Ambulatory Visit: Payer: Self-pay | Admitting: Family Medicine

## 2018-07-08 DIAGNOSIS — R740 Nonspecific elevation of levels of transaminase and lactic acid dehydrogenase [LDH]: Principal | ICD-10-CM

## 2018-07-08 DIAGNOSIS — R7401 Elevation of levels of liver transaminase levels: Secondary | ICD-10-CM

## 2018-07-08 DIAGNOSIS — R748 Abnormal levels of other serum enzymes: Secondary | ICD-10-CM

## 2018-07-08 MED ORDER — ATORVASTATIN CALCIUM 20 MG PO TABS: 20.0000 mg | ORAL_TABLET | Freq: Every day | ORAL | 0 refills | Status: DC

## 2018-07-10 ENCOUNTER — Telehealth: Payer: Self-pay

## 2018-07-10 DIAGNOSIS — R740 Nonspecific elevation of levels of transaminase and lactic acid dehydrogenase [LDH]: Secondary | ICD-10-CM

## 2018-07-10 DIAGNOSIS — Z5181 Encounter for therapeutic drug level monitoring: Secondary | ICD-10-CM

## 2018-07-10 DIAGNOSIS — R7401 Elevation of levels of liver transaminase levels: Secondary | ICD-10-CM

## 2018-07-10 DIAGNOSIS — E785 Hyperlipidemia, unspecified: Secondary | ICD-10-CM

## 2018-07-10 NOTE — Telephone Encounter (Signed)
Copied from [place] [id number]. Topic: General - Other >> Jul 09, 2018 10:46 AM Lennox Solders wrote: Reason for CRM:pt would like to know the reason she needs to see GI specialist. Pt was not aware a referral was placed. Pt would a return call >> Jul 10, 2018  1:56 PM Lennox Solders wrote: Pt is returning Kalany Diekmann call

## 2018-07-10 NOTE — Telephone Encounter (Signed)
Patient called back regarding labs and GI referral for elevated liver function.  She declines at this time, states she saw the same doctor for this over a year ago and had serveral test done and doctor stated everything was normal.

## 2018-07-12 NOTE — Telephone Encounter (Signed)
Pt called to check what Dr. Sanda Klein states regarding liver function. She is asking if levels are higher this time than last time. She would like call back at [phone].

## 2018-07-12 NOTE — Telephone Encounter (Signed)
I returned her call  She is feeling fine She saw Dr. Allen Norris for elevated liver enzymes in 2017, 2018; had labs and MRI done; he told her her numbers go up and down; he is not worried; he even wrote her a letter for her insurance to say it was okay  Reviewed liver enzymes over the phone MRI Jan 2018 was normal  She is working on her weight and healthy eating; working on her cholesterol  We reviewed her weights from 2017 and now; she is working on her weight  She would like to have me monitor her liver; she believes it is going up and down and does not want to see the GI doctor again  She wishes to return in 6 weeks to have her liver and cholesterol checked; last week of February, orders are in Call me if any abd pain or nausea, any liver sx  Take 1,000 vitamin D3 daily, not any more

## 2018-07-16 ENCOUNTER — Ambulatory Visit
Admission: RE | Admit: 2018-07-16 | Discharge: 2018-07-16 | Disposition: A | Discharge status: Home / Self Care | Payer: Medicare Other | Source: Ambulatory Visit | Attending: Family Medicine | Admitting: Family Medicine

## 2018-07-16 ENCOUNTER — Encounter: Payer: Self-pay | Admitting: Family Medicine

## 2018-07-16 DIAGNOSIS — I6523 Occlusion and stenosis of bilateral carotid arteries: Secondary | ICD-10-CM

## 2018-07-16 DIAGNOSIS — E785 Hyperlipidemia, unspecified: Secondary | ICD-10-CM | POA: Diagnosis not present

## 2018-07-16 DIAGNOSIS — R0989 Other specified symptoms and signs involving the circulatory and respiratory systems: Secondary | ICD-10-CM | POA: Diagnosis not present

## 2018-07-16 HISTORY — DX: Occlusion and stenosis of bilateral carotid arteries: I65.23

## 2018-08-05 ENCOUNTER — Other Ambulatory Visit: Payer: Self-pay | Admitting: Family Medicine

## 2018-08-12 ENCOUNTER — Other Ambulatory Visit: Payer: Self-pay | Admitting: Family Medicine

## 2018-09-02 ENCOUNTER — Other Ambulatory Visit: Payer: Self-pay | Admitting: Family Medicine

## 2018-09-02 DIAGNOSIS — I10 Essential (primary) hypertension: Secondary | ICD-10-CM

## 2018-09-11 ENCOUNTER — Ambulatory Visit: Payer: Medicare Other | Admitting: Family Medicine

## 2018-10-14 ENCOUNTER — Other Ambulatory Visit: Payer: Self-pay | Admitting: Family Medicine

## 2018-10-15 NOTE — Telephone Encounter (Signed)
Lab Results  Component Value Date   CHOL 197 07/02/2018   HDL 48 (L) 07/02/2018   LDLCALC 126 (H) 07/02/2018   TRIG 122 07/02/2018   CHOLHDL 4.1 07/02/2018   Lab Results  Component Value Date   ALT 62 (H) 07/02/2018   Patient needs labs before I can approve any more cholesterol medicine She was supposed to have bloodwork done a few months ago Thank you

## 2018-10-15 NOTE — Telephone Encounter (Signed)
Pt.notified

## 2018-10-17 ENCOUNTER — Other Ambulatory Visit: Payer: Self-pay

## 2018-10-17 ENCOUNTER — Ambulatory Visit (INDEPENDENT_AMBULATORY_CARE_PROVIDER_SITE_OTHER): Payer: Medicare Other | Admitting: Family Medicine

## 2018-10-17 DIAGNOSIS — Z5181 Encounter for therapeutic drug level monitoring: Secondary | ICD-10-CM

## 2018-10-17 DIAGNOSIS — H6981 Other specified disorders of Eustachian tube, right ear: Secondary | ICD-10-CM | POA: Diagnosis not present

## 2018-10-17 DIAGNOSIS — I10 Essential (primary) hypertension: Secondary | ICD-10-CM | POA: Diagnosis not present

## 2018-10-17 DIAGNOSIS — E785 Hyperlipidemia, unspecified: Secondary | ICD-10-CM | POA: Diagnosis not present

## 2018-10-17 DIAGNOSIS — E669 Obesity, unspecified: Secondary | ICD-10-CM | POA: Diagnosis not present

## 2018-10-17 DIAGNOSIS — J309 Allergic rhinitis, unspecified: Secondary | ICD-10-CM

## 2018-10-17 DIAGNOSIS — R748 Abnormal levels of other serum enzymes: Secondary | ICD-10-CM

## 2018-10-17 MED ORDER — FLUTICASONE PROPIONATE 50 MCG/ACT NA SUSP: 1.0000 | Freq: Two times a day (BID) | NASAL | 11 refills | Status: DC | PRN

## 2018-10-17 MED ORDER — LORATADINE 10 MG PO TABS: 10.0000 mg | ORAL_TABLET | Freq: Every day | ORAL | 11 refills | Status: DC | PRN

## 2018-10-17 MED ORDER — PRAVASTATIN SODIUM 20 MG PO TABS: 20.0000 mg | ORAL_TABLET | Freq: Every day | ORAL | 0 refills | Status: DC

## 2018-10-17 NOTE — Assessment & Plan Note (Signed)
Start back on claritin and nasal corticosteroid; refills sent

## 2018-10-17 NOTE — Patient Outreach (Signed)
[facility]) Care Management  10/17/2018  Allison Pham [birth year] [id number]   Medication Adherence call to Allison Pham HIPPA Compliant Voice message left with a call back number. Allison Pham is showing past due on Atorvastatin 20 mg under [place].   [place] Management Direct Dial [phone]  Fax [phone] [email or web address]

## 2018-10-17 NOTE — Assessment & Plan Note (Signed)
Continue medicine, limit salt

## 2018-10-17 NOTE — Assessment & Plan Note (Signed)
Limit fatty meats, saturated fats; half strength statin; continue zetia as before

## 2018-10-17 NOTE — Assessment & Plan Note (Addendum)
Milk thistle suggested; limit statin for now; call me if RUQ pain, loss of appetite, jaundice, other symptoms; will stop atorvastatin and start pravastatin instead, as there is evidence of that pravastatin is okay for individuals with liver issues, especially since we cannot monitor LFTs during pandemic

## 2018-10-17 NOTE — Assessment & Plan Note (Signed)
Patient is afraid to come in for the labs to monitor the cholesterol medicine; discussed going to half dose of the lipid medicine (statin) until she can have labs done; she'll take half dose and come in for labs when safe

## 2018-10-17 NOTE — Progress Notes (Signed)
There were no vitals taken for this visit.   Subjective:    Patient ID: Allison Pham, female    DOB: 04-Oct-1960, 58 y.o.   MRN: [id number]  HPI: Allison Pham is a 58 y.o. female  No chief complaint on file.   HPI Virtual Visit via Telephone/Video Note  Due to national recommendations of social distancing due to the COVID-19 pandemic, an audio/video telehealth visit is felt to be most appropriate for this patient at this time.    I connected with the patient via:  Telephone Call started: 10:49 am I verified that I am speaking with the correct person using two identifiers.  Provider location: home office with door closed, earphones / headset on Patient location: home Additional participants: no one  I discussed the limitations, risks, and privacy concerns of performing an evaluation and management service by telephone. I discussed the availability of in-person appointments. No s/s of COVID-19 I explained that he/she may be responsible for charges related to this service. The patient expressed understanding and agreed to proceed.  Call ended: 11:03 Total length of call: 13 minutes and 38 seconds  She is staying home; she does not work with a therapist, does not think it would be helpful; she relies on her family and they are really supportive; no SI/HI  High cholesterol; on statin and zetia; afraid to come in for labs b/c of COVID-19 pandemic; feels good  Anxiety; on buspirone BID PRN; just when needed  Allergies: flonase and claritin are on the medicine list, but not taking; would like refills  Hypertension; checking BP at times, can't depend on her watch that she was using, sometimes it was off; taking medicine; staying away from salt  Overweight; can't go to the gym any more (closed during pandmeic); she had been losing weight until they closed; now can't go and gained it back; thinking about the box about exercise, staying safe  Fall Risk  07/02/2018 06/25/2018  06/11/2018 05/14/2018 03/26/2018  Falls in the past year? 0 0 0 0 No  Number falls in past yr: - 0 - 0 -  Injury with Fall? - 0 - - -    Relevant past medical, surgical, family and social history reviewed Past Medical History:  Diagnosis Date  . Anxiety   . Carotid atherosclerosis, bilateral 07/16/2018   rescan Jan 2021  . Emphysema of lung ([place]) 03/21/2018  . GERD (gastroesophageal reflux disease)   . Hypercholesteremia   . Hypertension   . Tick bite   . Vertigo   . Wears dentures    partial upper   Past Surgical History:  Procedure Laterality Date  . ABDOMINAL HYSTERECTOMY     Partial  . COLONOSCOPY WITH PROPOFOL N/A 05/10/2017   Procedure: COLONOSCOPY WITH PROPOFOL;  Surgeon: [name], MD;  Location: [facility];  Service: Endoscopy;  Laterality: N/A;  . HEMORRHOID SURGERY    . POLYPECTOMY N/A 05/10/2017   Procedure: POLYPECTOMY;  Surgeon: [name], MD;  Location: [facility];  Service: Endoscopy;  Laterality: N/A;   Family History  Problem Relation Age of Onset  . Cancer Mother        Colon  . Hypertension Mother   . Hypertension Father   . Heart attack Father   . Heart disease Father   . Hypertension Sister   . COPD Sister   . Hypertension Brother   . Heart disease Brother   . Cancer Maternal Aunt   . Breast cancer Maternal Aunt   .  Cancer Maternal Uncle   . Breast cancer Maternal Aunt   . Diabetes Neg Hx   . Stroke Neg Hx    Social History   Tobacco Use  . Smoking status: Former Smoker    Packs/day: 1.00    Years: 38.00    Pack years: 38.00    Types: Cigarettes    Last attempt to quit: 09/13/2015    Years since quitting: 3.0  . Smokeless tobacco: Never Used  Substance Use Topics  . Alcohol use: Never    Alcohol/week: 0.0 standard drinks    Frequency: Never    Comment: rare - holidays  . Drug use: No     Office Visit from 07/02/2018 in [facility]  AUDIT-C Score  0      Interim medical history since  last visit reviewed. Allergies and medications reviewed  Review of Systems Per HPI unless specifically indicated above     Objective:    There were no vitals taken for this visit.  Wt Readings from Last 3 Encounters:  07/02/18 164 lb 3.2 oz (74.5 kg)  06/25/18 165 lb 6.4 oz (75 kg)  06/11/18 165 lb (74.8 kg)    Physical Exam Pulmonary:     Effort: No respiratory distress.  Neurological:     Mental Status: She is alert.     Cranial Nerves: No dysarthria.  Psychiatric:        Speech: Speech is not rapid and pressured, delayed or slurred.     Results for orders placed or performed in visit on 07/02/18  Hepatic function panel  Result Value Ref Range   Total Protein 6.9 6.1 - 8.1 g/dL   Albumin 4.3 3.6 - 5.1 g/dL   Globulin 2.6 1.9 - 3.7 g/dL (calc)   AG Ratio 1.7 1.0 - 2.5 (calc)   Total Bilirubin 0.6 0.2 - 1.2 mg/dL   Bilirubin, Direct 0.1 0.0 - 0.2 mg/dL   Indirect Bilirubin 0.5 0.2 - 1.2 mg/dL (calc)   Alkaline phosphatase (APISO) 119 33 - 130 U/L   AST 36 (H) 10 - 35 U/L   ALT 62 (H) 6 - 29 U/L  Lipid panel  Result Value Ref Range   Cholesterol 197 <200 mg/dL   HDL 48 (L) >50 mg/dL   Triglycerides 122 <150 mg/dL   LDL Cholesterol (Calc) 126 (H) mg/dL (calc)   Total CHOL/HDL Ratio 4.1 <5.0 (calc)   Non-HDL Cholesterol (Calc) 149 (H) <130 mg/dL (calc)  Zinc  Result Value Ref Range   Zinc 82 60 - 130 mcg/dL  Vitamin B2(Riboflavin),Plasma  Result Value Ref Range   Vitamin B2(Riboflavin),Plasma 7.5 6.2 - 39.0 nmol/L  Folate  Result Value Ref Range   Folate 8.1 ng/mL      Assessment & Plan:   Problem List Items Addressed This Visit      Cardiovascular and Mediastinum   Essential hypertension, benign    Continue medicine, limit salt      Relevant Medications   pravastatin (PRAVACHOL) 20 MG tablet     Respiratory   Allergic rhinitis    Start back on claritin and nasal corticosteroid; refills sent        Other   Obesity (BMI 30.0-34.9)     Encouraged home activity      Medication monitoring encounter    Patient is afraid to come in for the labs to monitor the cholesterol medicine; discussed going to half dose of the lipid medicine (statin) until she can have labs done; she'll take  half dose and come in for labs when safe      Elevated serum GGT level    Milk thistle suggested; limit statin for now; call me if RUQ pain, loss of appetite, jaundice, other symptoms; will stop atorvastatin and start pravastatin instead, as there is evidence of that pravastatin is okay for individuals with liver issues, especially since we cannot monitor LFTs during pandemic      Dyslipidemia    Limit fatty meats, saturated fats; half strength statin; continue zetia as before      Relevant Medications   pravastatin (PRAVACHOL) 20 MG tablet    Other Visit Diagnoses    Eustachian tube dysfunction, right       Relevant Medications   fluticasone (FLONASE) 50 MCG/ACT nasal spray   loratadine (CLARITIN) 10 MG tablet    MD note: stop atorvastatin; start pravastatin, patient verbalized understanding  Follow up plan: Return in about 3 months (around 01/16/2019) for follow-up visit with Dr. Sanda Klein.  An after-visit summary was printed and given to the patient at [facility].  Please see the patient instructions which may contain other information and recommendations beyond what is mentioned above in the assessment and plan.  Meds ordered this encounter  Medications  . fluticasone (FLONASE) 50 MCG/ACT nasal spray    Sig: Place 1 spray into both nostrils 2 (two) times daily as needed.    Dispense:  16 g    Refill:  11  . loratadine (CLARITIN) 10 MG tablet    Sig: Take 1 tablet (10 mg total) by mouth daily as needed for allergies.    Dispense:  30 tablet    Refill:  11  . pravastatin (PRAVACHOL) 20 MG tablet    Sig: Take 1 tablet (20 mg total) by mouth daily. For cholesterol; this replaces atorvastatin    Dispense:  90 tablet    Refill:  0     Switching from atorvastatin    No orders of the defined types were placed in this encounter.

## 2018-10-17 NOTE — Assessment & Plan Note (Signed)
Encouraged home activity

## 2018-11-08 ENCOUNTER — Other Ambulatory Visit: Payer: Self-pay | Admitting: Family Medicine

## 2019-01-13 ENCOUNTER — Other Ambulatory Visit: Payer: Self-pay | Admitting: Family Medicine

## 2019-01-20 ENCOUNTER — Ambulatory Visit (INDEPENDENT_AMBULATORY_CARE_PROVIDER_SITE_OTHER): Payer: Medicare Other | Admitting: Nurse Practitioner

## 2019-01-20 ENCOUNTER — Other Ambulatory Visit: Payer: Self-pay

## 2019-01-20 ENCOUNTER — Encounter: Payer: Self-pay | Admitting: Nurse Practitioner

## 2019-01-20 DIAGNOSIS — I10 Essential (primary) hypertension: Secondary | ICD-10-CM

## 2019-01-20 DIAGNOSIS — E785 Hyperlipidemia, unspecified: Secondary | ICD-10-CM

## 2019-01-20 DIAGNOSIS — F411 Generalized anxiety disorder: Secondary | ICD-10-CM | POA: Insufficient documentation

## 2019-01-20 MED ORDER — TRAZODONE HCL 50 MG PO TABS: 25.0000 mg | ORAL_TABLET | Freq: Every day | ORAL | 2 refills | Status: DC

## 2019-01-20 NOTE — Progress Notes (Signed)
New Patient Office Visit  Subjective:  Patient ID: Allison Pham, female    DOB: 03/06/1961  Age: 58 y.o. MRN: [id number]  CC:  Chief Complaint  Patient presents with  . Establish Care  . Anxiety  . Hyperlipidemia    HPI Allison Pham presents for new patient visit to establish care.  Introduced to Designer, jewellery role and practice setting.  All questions answered. She reports she is disabled and has severe anxiety.  Needs new disability sticker for vehicle and will provide paperwork for provider.  HYPERTENSION / HYPERLIPIDEMIA Continues on Pravastatin + Metoprolol. Satisfied with current treatment? yes Duration of hypertension: chronic BP monitoring frequency: not checking BP range: not checking, but is going to start BP medication side effects: no Duration of hyperlipidemia: chronic Cholesterol medication side effects: no Cholesterol supplements: none Medication compliance: good compliance Aspirin: yes Recent stressors: yes Recurrent headaches: no Visual changes: no Palpitations: no Dyspnea: no Chest pain: no Lower extremity edema: no Dizzy/lightheaded: no   ANXIETY/STRESS Continues on Buspar 2.5 to 5 MG three times daily. Takes this once a day and states she does not take it more than this as it is "strong" for her.  She endorses her anxiety is worse at night and she has a hard time sleeping.  Has tried Zoloft and another SSRI in past without success.  Does not like to take addictive medications. Duration:stable Anxious mood: yes  Excessive worrying: no Irritability: no  Sweating: no Nausea: no Palpitations:no Hyperventilation: no Panic attacks: on occasion Agoraphobia: no  Obscessions/compulsions: no Depressed mood: no Depression screen [facility] 2/9 01/20/2019 07/02/2018 06/11/2018 06/11/2018 05/14/2018  Decreased Interest 3 2 - 1 1  Down, Depressed, Hopeless 0 2 - 1 1  PHQ - 2 Score 3 4 - 2 2  Altered sleeping 3 2 - 1 2  Tired, decreased energy 1 2 - 1 2   Change in appetite 0 0 - 0 0  Feeling bad or failure about yourself  0 1 - 0 0  Trouble concentrating 1 0 - 0 0  Moving slowly or fidgety/restless 0 0 - 0 0  Suicidal thoughts 0 0 0 1 0  PHQ-9 Score 8 9 - 5 6  Difficult doing work/chores Somewhat difficult Somewhat difficult - Somewhat difficult Not difficult at all  Some recent data might be hidden   Anhedonia: no Weight changes: no Insomnia: yes hard to fall asleep  Hypersomnia: no Fatigue/loss of energy: no Feelings of worthlessness: no Feelings of guilt: no Impaired concentration/indecisiveness: no Suicidal ideations: no  Crying spells: no Recent Stressors/Life Changes: no   Relationship problems: no   Family stress: no     Financial stress: no    Job stress: no    Recent death/loss: no  Past Medical History:  Diagnosis Date  . Anxiety   . Carotid atherosclerosis, bilateral 07/16/2018   rescan Jan 2021  . Emphysema of lung ([place]) 03/21/2018  . GERD (gastroesophageal reflux disease)   . Hypercholesteremia   . Hypertension   . Tick bite   . Vertigo   . Wears dentures    partial upper    Past Surgical History:  Procedure Laterality Date  . ABDOMINAL HYSTERECTOMY     Partial  . COLONOSCOPY WITH PROPOFOL N/A 05/10/2017   Procedure: COLONOSCOPY WITH PROPOFOL;  Surgeon: [name], MD;  Location: [facility];  Service: Endoscopy;  Laterality: N/A;  . HEMORRHOID SURGERY    . POLYPECTOMY N/A 05/10/2017   Procedure: POLYPECTOMY;  Surgeon:  [name], MD;  Location: [facility];  Service: Endoscopy;  Laterality: N/A;    Family History  Problem Relation Age of Onset  . Cancer Mother        Colon  . Hypertension Mother   . Hypertension Father   . Heart attack Father   . Heart disease Father   . Hypertension Sister   . COPD Sister   . Hypertension Brother   . Heart disease Brother   . Cancer Maternal Aunt   . Breast cancer Maternal Aunt   . Cancer Maternal Uncle   . Breast cancer Maternal  Aunt   . Diabetes Neg Hx   . Stroke Neg Hx     Social History   Socioeconomic History  . Marital status: Single    Spouse name: Not on file  . Number of children: Not on file  . Years of education: Not on file  . Highest education level: Not on file  Occupational History  . Not on file  Social Needs  . Financial resource strain: Not on file  . Food insecurity    Worry: Not on file    Inability: Not on file  . Transportation needs    Medical: Not on file    Non-medical: Not on file  Tobacco Use  . Smoking status: Former Smoker    Packs/day: 1.00    Years: 38.00    Pack years: 38.00    Types: Cigarettes    Quit date: 09/13/2015    Years since quitting: 3.3  . Smokeless tobacco: Never Used  Substance and Sexual Activity  . Alcohol use: Never    Alcohol/week: 0.0 standard drinks    Frequency: Never    Comment: rare - holidays  . Drug use: No  . Sexual activity: Not Currently    Birth control/protection: Surgical  Lifestyle  . Physical activity    Days per week: Not on file    Minutes per session: Not on file  . Stress: Not on file  Relationships  . Social Herbalist on phone: Not on file    Gets together: Not on file    Attends religious service: Not on file    Active member of club or organization: Not on file    Attends meetings of clubs or organizations: Not on file    Relationship status: Not on file  . Intimate partner violence    Fear of current or ex partner: Not on file    Emotionally abused: Not on file    Physically abused: Not on file    Forced sexual activity: Not on file  Other Topics Concern  . Not on file  Social History Narrative  . Not on file    ROS Review of Systems  Constitutional: Negative for activity change, appetite change, diaphoresis, fatigue and fever.  Respiratory: Negative for cough, chest tightness and shortness of breath.   Cardiovascular: Negative for chest pain, palpitations and leg swelling.  Gastrointestinal:  Negative for abdominal distention, abdominal pain, constipation, diarrhea, nausea and vomiting.  Neurological: Negative for dizziness, syncope, weakness, light-headedness, numbness and headaches.  Psychiatric/Behavioral: Negative.     Objective:   Today's Vitals: BP 127/87   Pulse 69   Temp 98.1 F (36.7 C) (Oral)   Ht 5' (1.524 m)   Wt 164 lb 12.8 oz (74.8 kg)   SpO2 98%   BMI 32.19 kg/m   Physical Exam Vitals signs and nursing note reviewed.  Constitutional:  General: She is awake. She is not in acute distress.    Appearance: She is well-developed. She is not ill-appearing.  HENT:     Head: Normocephalic.     Right Ear: Hearing normal.     Left Ear: Hearing normal.     Nose: Nose normal.     Mouth/Throat:     Mouth: Mucous membranes are moist.  Eyes:     General: Lids are normal.        Right eye: No discharge.        Left eye: No discharge.     Conjunctiva/sclera: Conjunctivae normal.     Pupils: Pupils are equal, round, and reactive to light.  Neck:     Musculoskeletal: Normal range of motion and neck supple.     Thyroid: No thyromegaly.     Vascular: No carotid bruit.  Cardiovascular:     Rate and Rhythm: Normal rate and regular rhythm.     Heart sounds: Normal heart sounds. No murmur. No gallop.   Pulmonary:     Effort: Pulmonary effort is normal.     Breath sounds: Normal breath sounds.  Abdominal:     General: Bowel sounds are normal.     Palpations: Abdomen is soft. There is no hepatomegaly or splenomegaly.  Musculoskeletal:     Right lower leg: No edema.     Left lower leg: No edema.  Lymphadenopathy:     Cervical: No cervical adenopathy.  Skin:    General: Skin is warm and dry.  Neurological:     Mental Status: She is alert and oriented to person, place, and time.  Psychiatric:        Attention and Perception: Attention normal.        Mood and Affect: Mood normal.        Behavior: Behavior normal. Behavior is cooperative.        Thought  Content: Thought content normal.        Judgment: Judgment normal.     Assessment & Plan:   Problem List Items Addressed This Visit      Cardiovascular and Mediastinum   Essential hypertension, benign    Chronic, ongoing with BP below goal today.  Continue current medication regimen and adjust as needed.  Recommend checking BP at home three mornings a week and documenting for provider.        Relevant Medications   aspirin 81 MG chewable tablet     Other   Dyslipidemia    Chronic, ongoing.  Continue current medication regimen and adjust as needed.  Labs next visit.        Generalized anxiety disorder    Chronic, ongoing.  Continue current medication regimen, Buspar as needed, and add on Trazodone which may benefit overall mood and sleep pattern.  Check LFT next visit..        Relevant Medications   traZODone (DESYREL) 50 MG tablet      Outpatient Encounter Medications as of 01/20/2019  Medication Sig  . aspirin 81 MG chewable tablet Chew 81 mg by mouth daily.  . busPIRone (BUSPAR) 5 MG tablet Take 0.5-1 tablets (2.5-5 mg total) by mouth 3 (three) times daily.  . cholecalciferol (VITAMIN D) 1000 units tablet Take 1,000 Units by mouth daily.  . cyclobenzaprine (FLEXERIL) 5 MG tablet Take 1 tablet (5 mg total) by mouth at bedtime as needed for muscle spasms. Do not drive while taking as can cause drowsiness.  . fluticasone (FLONASE) 50 MCG/ACT nasal spray Place  1 spray into both nostrils 2 (two) times daily as needed.  . gabapentin (NEURONTIN) 300 MG capsule Take 1 capsule (300 mg total) by mouth at bedtime.  . metoprolol succinate (TOPROL-XL) 25 MG 24 hr tablet Take 1 tablet (25 mg total) by mouth daily.  . pravastatin (PRAVACHOL) 20 MG tablet Take 1 tablet (20 mg total) by mouth daily. For cholesterol; this replaces atorvastatin  . loratadine (CLARITIN) 10 MG tablet Take 1 tablet (10 mg total) by mouth daily as needed for allergies. (Patient not taking: Reported on 01/20/2019)   . traZODone (DESYREL) 50 MG tablet Take 0.5 tablets (25 mg total) by mouth at bedtime.  . [DISCONTINUED] ezetimibe (ZETIA) 10 MG tablet Take 1 tablet (10 mg total) by mouth daily.   No facility-administered encounter medications on file as of 01/20/2019.     Follow-up: Return in about 4 weeks (around 02/17/2019) for Mood Follow-up and LFT's.   Venita Lick, NP

## 2019-01-20 NOTE — Patient Instructions (Addendum)
Venlafaxine tablets What is this medicine? VENLAFAXINE (VEN la fax een) is used to treat depression, anxiety and panic disorder. This medicine may be used for other purposes; ask your health care provider or pharmacist if you have questions. COMMON BRAND NAME(S): Effexor What should I tell my health care provider before I take this medicine? They need to know if you have any of these conditions:  bleeding disorders  glaucoma  heart disease  high blood pressure  high cholesterol  kidney disease  liver disease  low levels of sodium in the blood  mania or bipolar disorder  seizures  suicidal thoughts, plans, or attempt; a previous suicide attempt by you or a family  take medicines that treat or prevent blood clots  thyroid disease  an unusual or allergic reaction to venlafaxine, desvenlafaxine, other medicines, foods, dyes, or preservatives  pregnant or trying to get pregnant  breast-feeding How should I use this medicine? Take this medicine by mouth with a glass of water. Follow the directions on the prescription label. Take it with food. Take your medicine at regular intervals. Do not take your medicine more often than directed. Do not stop taking this medicine suddenly except upon the advice of your doctor. Stopping this medicine too quickly may cause serious side effects or your condition may worsen. A special MedGuide will be given to you by the pharmacist with each prescription and refill. Be sure to read this information carefully each time. Talk to your pediatrician regarding the use of this medicine in children. Special care may be needed. Overdosage: If you think you have taken too much of this medicine contact a poison control center or emergency room at once. NOTE: This medicine is only for you. Do not share this medicine with others. What if I miss a dose? If you miss a dose, take it as soon as you can. If it is almost time for your next dose, take only that  dose. Do not take double or extra doses. What may interact with this medicine? Do not take this medicine with any of the following medications:  certain medicines for fungal infections like fluconazole, itraconazole, ketoconazole, posaconazole, voriconazole  cisapride  desvenlafaxine  dronedarone  duloxetine  levomilnacipran  linezolid  MAOIs like Carbex, Eldepryl, Marplan, Nardil, and Parnate  methylene blue (injected into a vein)  milnacipran  pimozide  thioridazine This medicine may also interact with the following medications:  amphetamines  aspirin and aspirin-like medicines  certain medicines for depression, anxiety, or psychotic disturbances  certain medicines for migraine headaches like almotriptan, eletriptan, frovatriptan, naratriptan, rizatriptan, sumatriptan, zolmitriptan  certain medicines for sleep  certain medicines that treat or prevent blood clots like dalteparin, enoxaparin, warfarin  cimetidine  clozapine  diuretics  fentanyl  furazolidone  indinavir  isoniazid  lithium  metoprolol  NSAIDS, medicines for pain and inflammation, like ibuprofen or naproxen  other medicines that prolong the QT interval (cause an abnormal heart rhythm) like dofetilide, ziprasidone  procarbazine  rasagiline  supplements like St. John's wort, kava kava, valerian  tramadol  tryptophan This list may not describe all possible interactions. Give your health care provider a list of all the medicines, herbs, non-prescription drugs, or dietary supplements you use. Also tell them if you smoke, drink alcohol, or use illegal drugs. Some items may interact with your medicine. What should I watch for while using this medicine? Tell your doctor if your symptoms do not get better or if they get worse. Visit your doctor or health care  professional for regular checks on your progress. Because it may take several weeks to see the full effects of this medicine, it  is important to continue your treatment as prescribed by your doctor. Patients and their families should watch out for new or worsening thoughts of suicide or depression. Also watch out for sudden changes in feelings such as feeling anxious, agitated, panicky, irritable, hostile, aggressive, impulsive, severely restless, overly excited and hyperactive, or not being able to sleep. If this happens, especially at the beginning of treatment or after a change in dose, call your health care professional. This medicine can cause an increase in blood pressure. Check with your doctor for instructions on monitoring your blood pressure while taking this medicine. You may get drowsy or dizzy. Do not drive, use machinery, or do anything that needs mental alertness until you know how this medicine affects you. Do not stand or sit up quickly, especially if you are an older patient. This reduces the risk of dizzy or fainting spells. Alcohol may interfere with the effect of this medicine. Avoid alcoholic drinks. Your mouth may get dry. Chewing sugarless gum, sucking hard candy and drinking plenty of water will help. Contact your doctor if the problem does not go away or is severe. What side effects may I notice from receiving this medicine? Side effects that you should report to your doctor or health care professional as soon as possible:  allergic reactions like skin rash, itching or hives, swelling of the face, lips, or tongue  anxious  breathing problems  confusion  changes in vision  chest pain  confusion  elevated mood, decreased need for sleep, racing thoughts, impulsive behavior  eye pain  fast, irregular heartbeat  feeling faint or lightheaded, falls  feeling agitated, angry, or irritable  hallucination, loss of contact with reality  high blood pressure  loss of balance or coordination  palpitations  redness, blistering, peeling or loosening of the skin, including inside the  mouth  restlessness, pacing, inability to keep still  seizures  stiff muscles  suicidal thoughts or other mood changes  trouble passing urine or change in the amount of urine  trouble sleeping  unusual bleeding or bruising  unusually weak or tired  vomiting Side effects that usually do not require medical attention (report to your doctor or health care professional if they continue or are bothersome):  change in sex drive or performance  change in appetite or weight  constipation  dizziness  dry mouth  headache  increased sweating  nausea  tired This list may not describe all possible side effects. Call your doctor for medical advice about side effects. You may report side effects to FDA at 1-800-FDA-1088. Where should I keep my medicine? Keep out of the reach of children. Store at a controlled temperature between 20 and 25 degrees C (68 and 77 degrees F), in a dry place. Throw away any unused medicine after the expiration date. NOTE: This sheet is a summary. It may not cover all possible information. If you have questions about this medicine, talk to your doctor, pharmacist, or health care provider.  2020 Elsevier/Gold Standard (2018-06-04 12:08:23)  Trazodone tablets What is this medicine? TRAZODONE (TRAZ oh done) is used to treat depression. This medicine may be used for other purposes; ask your health care provider or pharmacist if you have questions. COMMON BRAND NAME(S): Desyrel What should I tell my health care provider before I take this medicine? They need to know if you have any of  these conditions:  attempted suicide or thinking about it  bipolar disorder  bleeding problems  glaucoma  heart disease, or previous heart attack  irregular heart beat  kidney or liver disease  low levels of sodium in the blood  an unusual or allergic reaction to trazodone, other medicines, foods, dyes or preservatives  pregnant or trying to get  pregnant  breast-feeding How should I use this medicine? Take this medicine by mouth with a glass of water. Follow the directions on the prescription label. Take this medicine shortly after a meal or a light snack. Take your medicine at regular intervals. Do not take your medicine more often than directed. Do not stop taking this medicine suddenly except upon the advice of your doctor. Stopping this medicine too quickly may cause serious side effects or your condition may worsen. A special MedGuide will be given to you by the pharmacist with each prescription and refill. Be sure to read this information carefully each time. Talk to your pediatrician regarding the use of this medicine in children. Special care may be needed. Overdosage: If you think you have taken too much of this medicine contact a poison control center or emergency room at once. NOTE: This medicine is only for you. Do not share this medicine with others. What if I miss a dose? If you miss a dose, take it as soon as you can. If it is almost time for your next dose, take only that dose. Do not take double or extra doses. What may interact with this medicine? Do not take this medicine with any of the following medications:  certain medicines for fungal infections like fluconazole, itraconazole, ketoconazole, posaconazole, voriconazole  cisapride  dronedarone  linezolid  MAOIs like Carbex, Eldepryl, Marplan, Nardil, and Parnate  mesoridazine  methylene blue (injected into a vein)  pimozide  saquinavir  thioridazine This medicine may also interact with the following medications:  alcohol  antiviral medicines for HIV or AIDS  aspirin and aspirin-like medicines  barbiturates like phenobarbital  certain medicines for blood pressure, heart disease, irregular heart beat  certain medicines for depression, anxiety, or psychotic disturbances  certain medicines for migraine headache like almotriptan, eletriptan,  frovatriptan, naratriptan, rizatriptan, sumatriptan, zolmitriptan  certain medicines for seizures like carbamazepine and phenytoin  certain medicines for sleep  certain medicines that treat or prevent blood clots like dalteparin, enoxaparin, warfarin  digoxin  fentanyl  lithium  NSAIDS, medicines for pain and inflammation, like ibuprofen or naproxen  other medicines that prolong the QT interval (cause an abnormal heart rhythm) like dofetilide  rasagiline  supplements like St. John's wort, kava kava, valerian  tramadol  tryptophan This list may not describe all possible interactions. Give your health care provider a list of all the medicines, herbs, non-prescription drugs, or dietary supplements you use. Also tell them if you smoke, drink alcohol, or use illegal drugs. Some items may interact with your medicine. What should I watch for while using this medicine? Tell your doctor if your symptoms do not get better or if they get worse. Visit your doctor or health care professional for regular checks on your progress. Because it may take several weeks to see the full effects of this medicine, it is important to continue your treatment as prescribed by your doctor. Patients and their families should watch out for new or worsening thoughts of suicide or depression. Also watch out for sudden changes in feelings such as feeling anxious, agitated, panicky, irritable, hostile, aggressive, impulsive, severely restless, overly  excited and hyperactive, or not being able to sleep. If this happens, especially at the beginning of treatment or after a change in dose, call your health care professional. Dennis Bast may get drowsy or dizzy. Do not drive, use machinery, or do anything that needs mental alertness until you know how this medicine affects you. Do not stand or sit up quickly, especially if you are an older patient. This reduces the risk of dizzy or fainting spells. Alcohol may interfere with the  effect of this medicine. Avoid alcoholic drinks. This medicine may cause dry eyes and blurred vision. If you wear contact lenses you may feel some discomfort. Lubricating drops may help. See your eye doctor if the problem does not go away or is severe. Your mouth may get dry. Chewing sugarless gum, sucking hard candy and drinking plenty of water may help. Contact your doctor if the problem does not go away or is severe. What side effects may I notice from receiving this medicine? Side effects that you should report to your doctor or health care professional as soon as possible:  allergic reactions like skin rash, itching or hives, swelling of the face, lips, or tongue  elevated mood, decreased need for sleep, racing thoughts, impulsive behavior  confusion  fast, irregular heartbeat  feeling faint or lightheaded, falls  feeling agitated, angry, or irritable  loss of balance or coordination  painful or prolonged erections  restlessness, pacing, inability to keep still  suicidal thoughts or other mood changes  tremors  trouble sleeping  seizures  unusual bleeding or bruising Side effects that usually do not require medical attention (report to your doctor or health care professional if they continue or are bothersome):  change in sex drive or performance  change in appetite or weight  constipation  headache  muscle aches or pains  nausea This list may not describe all possible side effects. Call your doctor for medical advice about side effects. You may report side effects to FDA at 1-800-FDA-1088. Where should I keep my medicine? Keep out of the reach of children. Store at room temperature between 15 and 30 degrees C (59 to 86 degrees F). Protect from light. Keep container tightly closed. Throw away any unused medicine after the expiration date. NOTE: This sheet is a summary. It may not cover all possible information. If you have questions about this medicine, talk to  your doctor, pharmacist, or health care provider.  2020 Elsevier/Gold Standard (2018-06-04 11:46:46)

## 2019-01-20 NOTE — Assessment & Plan Note (Signed)
Chronic, ongoing.  Continue current medication regimen, Buspar as needed, and add on Trazodone which may benefit overall mood and sleep pattern.  Check LFT next visit.Marland Kitchen

## 2019-01-20 NOTE — Assessment & Plan Note (Signed)
Chronic, ongoing with BP below goal today.  Continue current medication regimen and adjust as needed.  Recommend checking BP at home three mornings a week and documenting for provider.

## 2019-01-20 NOTE — Assessment & Plan Note (Signed)
Chronic, ongoing.  Continue current medication regimen and adjust as needed.  Labs next visit.    

## 2019-01-28 ENCOUNTER — Telehealth: Payer: Self-pay

## 2019-01-28 NOTE — Telephone Encounter (Signed)
Tried calling patient to let her know her paperwork for Disability Parking Placard was ready for pick-up. Form in bin for pick-up.  LVM for patient to return call.

## 2019-01-28 NOTE — Telephone Encounter (Signed)
Pt is aware parking placard ready for pick up

## 2019-02-18 ENCOUNTER — Ambulatory Visit: Payer: Medicare Other | Admitting: Nurse Practitioner

## 2019-02-26 ENCOUNTER — Ambulatory Visit: Payer: Medicare Other | Admitting: Nurse Practitioner

## 2019-02-28 ENCOUNTER — Other Ambulatory Visit: Payer: Self-pay

## 2019-02-28 ENCOUNTER — Ambulatory Visit (INDEPENDENT_AMBULATORY_CARE_PROVIDER_SITE_OTHER): Payer: Medicare Other | Admitting: Nurse Practitioner

## 2019-02-28 ENCOUNTER — Encounter: Payer: Self-pay | Admitting: Nurse Practitioner

## 2019-02-28 DIAGNOSIS — F411 Generalized anxiety disorder: Secondary | ICD-10-CM

## 2019-02-28 DIAGNOSIS — R748 Abnormal levels of other serum enzymes: Secondary | ICD-10-CM | POA: Diagnosis not present

## 2019-02-28 NOTE — Assessment & Plan Note (Signed)
Chronic, stable with addition of Trazodone. Continue current medication regimen.  Return in 8 weeks.

## 2019-02-28 NOTE — Assessment & Plan Note (Signed)
Chronic, will check labs outpatient.  Continue Pravastatin and milk thistle (starting this next week).   Call for any acute changes.

## 2019-02-28 NOTE — Progress Notes (Signed)
There were no vitals taken for this visit.   Subjective:    Patient ID: Allison Pham, female    DOB: 1960/09/30, 58 y.o.   MRN: [id number]  HPI: Allison Pham is a 58 y.o. female  Chief Complaint  Patient presents with  . Depression    . This visit was completed via D[employer] due to the restrictions of the COVID-19 pandemic. All issues as above were discussed and addressed. Physical exam was done as above through visual confirmation on Doximity. If it was felt that the patient should be evaluated in the office, they were directed there. The patient verbally consented to this visit. . Location of the patient: home . Location of the provider: home . Those involved with this call:  . Provider: Marnee Guarneri, DNP . CMA: Yvonna Alanis, CMA . Front Desk/Registration: Jill Side  . Time spent on call: 15 minutes with patient face to face via video conference. More than 50% of this time was spent in counseling and coordination of care. 10 minutes total spent in review of patient's record and preparation of their chart.  . I verified patient identity using two factors (patient name and date of birth). Patient consents verbally to being seen via telemedicine visit today.    DEPRESSION Continues on Buspar 2.5 to 5 MG TID, Trazodone added during last visit to help with sleep and mood, as she endorsed poor sleep pattern.  States she has been taking this on as needed basis only and has noticed it helps her to sleep, but does not take it every night. Mood status: stable Satisfied with current treatment?: yes Symptom severity: mild  Duration of current treatment : chronic Side effects: no Medication compliance: good compliance Psychotherapy/counseling: none Depressed mood: no Anxious mood: no Anhedonia: no Significant weight loss or gain: no Insomnia: yes hard to fall asleep Fatigue: no Feelings of worthlessness or guilt: no Impaired concentration/indecisiveness: no Suicidal  ideations: no Hopelessness: no Crying spells: no Depression screen [facility] 2/9 02/28/2019 01/20/2019 07/02/2018 06/11/2018 06/11/2018  Decreased Interest 1 3 2  - 1  Down, Depressed, Hopeless 1 0 2 - 1  PHQ - 2 Score 2 3 4  - 2  Altered sleeping 1 3 2  - 1  Tired, decreased energy 1 1 2  - 1  Change in appetite 0 0 0 - 0  Feeling bad or failure about yourself  0 0 1 - 0  Trouble concentrating 0 1 0 - 0  Moving slowly or fidgety/restless 0 0 0 - 0  Suicidal thoughts 0 0 0 0 1  PHQ-9 Score 4 8 9  - 5  Difficult doing work/chores Not difficult at all Somewhat difficult Somewhat difficult - Somewhat difficult  Some recent data might be hidden   GAD 7 : Generalized Anxiety Score 02/28/2019 01/20/2019 04/23/2015  Nervous, Anxious, on Edge 1 2 1   Control/stop worrying 1 1 2   Worry too much - different things 1 3 2   Trouble relaxing 1 2 2   Restless 0 0 0  Easily annoyed or irritable 1 0 0  Afraid - awful might happen 0 0 0  Total GAD 7 Score 5 8 7   Anxiety Difficulty Not difficult at all Somewhat difficult Not difficult at all    ELEVATED LFTs: LFT in January AST/ALT 36/62.  Denies Tylenol or alcohol use.  Has history of this in past and they have had to switch cholesterol medications many times due to elevation with medication on board.  Was switched to Pravastatin in  April 2020, has been on Lipitor.  They had discussed at her April visit trying milk thistle, which she plans on starting now.  Her daughter just purchased some for her.  Denies RUQ pain, loss of appetite, or jaundice.  Relevant past medical, surgical, family and social history reviewed and updated as indicated. Interim medical history since our last visit reviewed. Allergies and medications reviewed and updated.  Review of Systems  Constitutional: Negative for activity change, appetite change, diaphoresis, fatigue and fever.  Respiratory: Negative for cough, chest tightness and shortness of breath.   Cardiovascular: Negative for chest  pain, palpitations and leg swelling.  Gastrointestinal: Negative for abdominal distention, abdominal pain, constipation, diarrhea, nausea and vomiting.  Neurological: Negative for dizziness, syncope, weakness, light-headedness, numbness and headaches.  Psychiatric/Behavioral: Positive for sleep disturbance.    Per HPI unless specifically indicated above     Objective:    There were no vitals taken for this visit.  Wt Readings from Last 3 Encounters:  01/20/19 164 lb 12.8 oz (74.8 kg)  07/02/18 164 lb 3.2 oz (74.5 kg)  06/25/18 165 lb 6.4 oz (75 kg)    Physical Exam Vitals signs and nursing note reviewed.  Constitutional:      General: She is awake. She is not in acute distress.    Appearance: She is well-developed. She is not ill-appearing.  HENT:     Head: Normocephalic.     Right Ear: Hearing normal.     Left Ear: Hearing normal.  Eyes:     General: Lids are normal.        Right eye: No discharge.        Left eye: No discharge.     Conjunctiva/sclera: Conjunctivae normal.  Neck:     Musculoskeletal: Normal range of motion.  Pulmonary:     Effort: Pulmonary effort is normal. No accessory muscle usage or respiratory distress.  Neurological:     Mental Status: She is alert and oriented to person, place, and time.  Psychiatric:        Attention and Perception: Attention normal.        Mood and Affect: Mood normal.        Behavior: Behavior normal. Behavior is cooperative.        Thought Content: Thought content normal.        Judgment: Judgment normal.     Results for orders placed or performed in visit on 07/02/18  Hepatic function panel  Result Value Ref Range   Total Protein 6.9 6.1 - 8.1 g/dL   Albumin 4.3 3.6 - 5.1 g/dL   Globulin 2.6 1.9 - 3.7 g/dL (calc)   AG Ratio 1.7 1.0 - 2.5 (calc)   Total Bilirubin 0.6 0.2 - 1.2 mg/dL   Bilirubin, Direct 0.1 0.0 - 0.2 mg/dL   Indirect Bilirubin 0.5 0.2 - 1.2 mg/dL (calc)   Alkaline phosphatase (APISO) 119 33 - 130 U/L    AST 36 (H) 10 - 35 U/L   ALT 62 (H) 6 - 29 U/L  Lipid panel  Result Value Ref Range   Cholesterol 197 <200 mg/dL   HDL 48 (L) >50 mg/dL   Triglycerides 122 <150 mg/dL   LDL Cholesterol (Calc) 126 (H) mg/dL (calc)   Total CHOL/HDL Ratio 4.1 <5.0 (calc)   Non-HDL Cholesterol (Calc) 149 (H) <130 mg/dL (calc)  Zinc  Result Value Ref Range   Zinc 82 60 - 130 mcg/dL  Vitamin B2(Riboflavin),Plasma  Result Value Ref Range   Vitamin B2(Riboflavin),Plasma 7.5  6.2 - 39.0 nmol/L  Folate  Result Value Ref Range   Folate 8.1 ng/mL      Assessment & Plan:   Problem List Items Addressed This Visit      Other   Elevated serum GGT level    Chronic, will check labs outpatient.  Continue Pravastatin and milk thistle (starting this next week).   Call for any acute changes.      Generalized anxiety disorder    Chronic, stable with addition of Trazodone. Continue current medication regimen.  Return in 8 weeks.          I discussed the assessment and treatment plan with the patient. The patient was provided an opportunity to ask questions and all were answered. The patient agreed with the plan and demonstrated an understanding of the instructions.   The patient was advised to call back or seek an in-person evaluation if the symptoms worsen or if the condition fails to improve as anticipated.   I provided 15 minutes of time during this encounter.  Follow up plan: Return in about 2 months (around 04/30/2019) for Mood, HTN/HLD, Elevated LFT.

## 2019-02-28 NOTE — Patient Instructions (Signed)
Fat and Cholesterol Restricted Eating Plan Getting too much fat and cholesterol in your diet may cause health problems. Choosing the right foods helps keep your fat and cholesterol at normal levels. This can keep you from getting certain diseases. Your doctor may recommend an eating plan that includes:  Total fat: ______% or less of total calories a day.  Saturated fat: ______% or less of total calories a day.  Cholesterol: less than _________mg a day.  Fiber: ______g a day. What are tips for following this plan? Meal planning  At meals, divide your plate into four equal parts: ? Fill one-half of your plate with vegetables and green salads. ? Fill one-fourth of your plate with whole grains. ? Fill one-fourth of your plate with low-fat (lean) protein foods.  Eat fish that is high in omega-3 fats at least two times a week. This includes mackerel, tuna, sardines, and salmon.  Eat foods that are high in fiber, such as whole grains, beans, apples, broccoli, carrots, peas, and barley. General tips   Work with your doctor to lose weight if you need to.  Avoid: ? Foods with added sugar. ? Fried foods. ? Foods with partially hydrogenated oils.  Limit alcohol intake to no more than 1 drink a day for nonpregnant women and 2 drinks a day for men. One drink equals 12 oz of beer, 5 oz of wine, or 1 oz of hard liquor. Reading food labels  Check food labels for: ? Trans fats. ? Partially hydrogenated oils. ? Saturated fat (g) in each serving. ? Cholesterol (mg) in each serving. ? Fiber (g) in each serving.  Choose foods with healthy fats, such as: ? Monounsaturated fats. ? Polyunsaturated fats. ? Omega-3 fats.  Choose grain products that have whole grains. Look for the word "whole" as the first word in the ingredient list. Cooking  Cook foods using low-fat methods. These include baking, boiling, grilling, and broiling.  Eat more home-cooked foods. Eat at restaurants and buffets  less often.  Avoid cooking using saturated fats, such as butter, cream, palm oil, palm kernel oil, and coconut oil. Recommended foods  Fruits  All fresh, canned (in natural juice), or frozen fruits. Vegetables  Fresh or frozen vegetables (raw, steamed, roasted, or grilled). Green salads. Grains  Whole grains, such as whole wheat or whole grain breads, crackers, cereals, and pasta. Unsweetened oatmeal, bulgur, barley, quinoa, or brown rice. Corn or whole wheat flour tortillas. Meats and other protein foods  Ground beef (85% or leaner), grass-fed beef, or beef trimmed of fat. Skinless chicken or turkey. Ground chicken or turkey. Pork trimmed of fat. All fish and seafood. Egg whites. Dried beans, peas, or lentils. Unsalted nuts or seeds. Unsalted canned beans. Nut butters without added sugar or oil. Dairy  Low-fat or nonfat dairy products, such as skim or 1% milk, 2% or reduced-fat cheeses, low-fat and fat-free ricotta or cottage cheese, or plain low-fat and nonfat yogurt. Fats and oils  Tub margarine without trans fats. Light or reduced-fat mayonnaise and salad dressings. Avocado. Olive, canola, sesame, or safflower oils. The items listed above may not be a complete list of foods and beverages you can eat. Contact a dietitian for more information. Foods to avoid Fruits  Canned fruit in heavy syrup. Fruit in cream or butter sauce. Fried fruit. Vegetables  Vegetables cooked in cheese, cream, or butter sauce. Fried vegetables. Grains  White bread. White pasta. White rice. Cornbread. Bagels, pastries, and croissants. Crackers and snack foods that contain trans fat   and hydrogenated oils. Meats and other protein foods  Fatty cuts of meat. Ribs, chicken wings, bacon, sausage, bologna, salami, chitterlings, fatback, hot dogs, bratwurst, and packaged lunch meats. Liver and organ meats. Whole eggs and egg yolks. Chicken and turkey with skin. Fried meat. Dairy  Whole or 2% milk, cream,  half-and-half, and cream cheese. Whole milk cheeses. Whole-fat or sweetened yogurt. Full-fat cheeses. Nondairy creamers and whipped toppings. Processed cheese, cheese spreads, and cheese curds. Beverages  Alcohol. Sugar-sweetened drinks such as sodas, lemonade, and fruit drinks. Fats and oils  Butter, stick margarine, lard, shortening, ghee, or bacon fat. Coconut, palm kernel, and palm oils. Sweets and desserts  Corn syrup, sugars, honey, and molasses. Candy. Jam and jelly. Syrup. Sweetened cereals. Cookies, pies, cakes, donuts, muffins, and ice cream. The items listed above may not be a complete list of foods and beverages you should avoid. Contact a dietitian for more information. Summary  Choosing the right foods helps keep your fat and cholesterol at normal levels. This can keep you from getting certain diseases.  At meals, fill one-half of your plate with vegetables and green salads.  Eat high-fiber foods, like whole grains, beans, apples, carrots, peas, and barley.  Limit added sugar, saturated fats, alcohol, and fried foods. This information is not intended to replace advice given to you by your health care provider. Make sure you discuss any questions you have with your health care provider. Document Released: 12/12/2011 Document Revised: 02/13/2018 Document Reviewed: 02/27/2017 Elsevier Patient Education  2020 Elsevier Inc.  

## 2019-03-11 ENCOUNTER — Telehealth: Payer: Self-pay | Admitting: *Deleted

## 2019-03-11 NOTE — Telephone Encounter (Signed)
Left message for patient to notify them that it is time to schedule annual low dose lung cancer screening CT scan. Instructed patient to call back to verify information prior to the scan being scheduled. CB# [phone] 

## 2019-03-17 ENCOUNTER — Other Ambulatory Visit: Payer: Self-pay | Admitting: Family Medicine

## 2019-03-17 DIAGNOSIS — I10 Essential (primary) hypertension: Secondary | ICD-10-CM

## 2019-03-17 NOTE — Telephone Encounter (Signed)
Next apt 04/25/19.

## 2019-03-17 NOTE — Telephone Encounter (Signed)
Requested medication (s) are due for refill today: yes  Requested medication (s) are on the active medication list:  yes  Last refill: 02/17/2019  Future visit scheduled: yes  Notes to clinic: ordering provider and pcp are different    Requested Prescriptions  Pending Prescriptions Disp Refills   metoprolol succinate (TOPROL-XL) 25 MG 24 hr tablet [Pharmacy Med Name: METOPROLOL SUCC ER 25 MG TAB] 30 tablet 0    Sig: Take 1 tablet (25 mg total) by mouth daily.     Cardiovascular:  Beta Blockers Passed - 03/17/2019 10:47 AM      Passed - Last BP in normal range    BP Readings from Last 1 Encounters:  01/20/19 127/87         Passed - Last Heart Rate in normal range    Pulse Readings from Last 1 Encounters:  01/20/19 69         Passed - Valid encounter within last 6 months    Recent Outpatient Visits          2 weeks ago Elevated serum GGT level   [place], [place] T, NP   1 month ago Essential hypertension, benign   [place], [place] T, NP   5 months ago Dyslipidemia   [place], Satira Anis, MD   8 months ago [place] of eye   [place], MD   9 months ago Axillary pain, left   [facility] [place], Satira Anis, MD      Future Appointments            In 1 month Cannady, Barbaraann Faster, NP [employer]

## 2019-03-17 NOTE — Telephone Encounter (Signed)
Not our pt

## 2019-03-21 ENCOUNTER — Other Ambulatory Visit: Payer: Medicare Other

## 2019-03-21 ENCOUNTER — Other Ambulatory Visit: Payer: Self-pay

## 2019-03-21 DIAGNOSIS — R748 Abnormal levels of other serum enzymes: Secondary | ICD-10-CM

## 2019-03-22 LAB — COMPREHENSIVE METABOLIC PANEL
ALT: 42 IU/L — ABNORMAL HIGH (ref 0–32)
AST: 29 IU/L (ref 0–40)
Albumin/Globulin Ratio: 2 (ref 1.2–2.2)
Albumin: 4.5 g/dL (ref 3.8–4.9)
Alkaline Phosphatase: 123 IU/L — ABNORMAL HIGH (ref 39–117)
BUN/Creatinine Ratio: 19 (ref 9–23)
BUN: 16 mg/dL (ref 6–24)
Bilirubin Total: 0.5 mg/dL (ref 0.0–1.2)
CO2: 22 mmol/L (ref 20–29)
Calcium: 9.8 mg/dL (ref 8.7–10.2)
Chloride: 103 mmol/L (ref 96–106)
Creatinine, Ser: 0.84 mg/dL (ref 0.57–1.00)
GFR calc Af Amer: 88 mL/min/{1.73_m2} (ref >59)
GFR calc non Af Amer: 76 mL/min/{1.73_m2} (ref >59)
Globulin, Total: 2.2 g/dL (ref 1.5–4.5)
Glucose: 101 mg/dL — ABNORMAL HIGH (ref 65–99)
Potassium: 4.4 mmol/L (ref 3.5–5.2)
Sodium: 140 mmol/L (ref 134–144)
Total Protein: 6.7 g/dL (ref 6.0–8.5)

## 2019-03-22 LAB — LIPID PANEL W/O CHOL/HDL RATIO
Cholesterol, Total: 227 mg/dL — ABNORMAL HIGH (ref 100–199)
HDL: 45 mg/dL (ref >39)
LDL Chol Calc (NIH): 150 mg/dL — ABNORMAL HIGH (ref 0–99)
Triglycerides: 178 mg/dL — ABNORMAL HIGH (ref 0–149)
VLDL Cholesterol Cal: 32 mg/dL (ref 5–40)

## 2019-04-08 ENCOUNTER — Telehealth: Payer: Self-pay | Admitting: Nurse Practitioner

## 2019-04-08 NOTE — Telephone Encounter (Signed)
Declined visit with Jonelle Sidle

## 2019-04-25 ENCOUNTER — Encounter: Payer: Self-pay | Admitting: Nurse Practitioner

## 2019-04-25 ENCOUNTER — Ambulatory Visit (INDEPENDENT_AMBULATORY_CARE_PROVIDER_SITE_OTHER): Payer: Medicare Other | Admitting: Nurse Practitioner

## 2019-04-25 ENCOUNTER — Other Ambulatory Visit: Payer: Self-pay

## 2019-04-25 VITALS — BP 137/87 | HR 66 | Temp 98.2°F | Ht 60.0 in | Wt 170.0 lb

## 2019-04-25 DIAGNOSIS — F411 Generalized anxiety disorder: Secondary | ICD-10-CM | POA: Diagnosis not present

## 2019-04-25 DIAGNOSIS — E559 Vitamin D deficiency, unspecified: Secondary | ICD-10-CM

## 2019-04-25 DIAGNOSIS — E785 Hyperlipidemia, unspecified: Secondary | ICD-10-CM | POA: Diagnosis not present

## 2019-04-25 DIAGNOSIS — I10 Essential (primary) hypertension: Secondary | ICD-10-CM

## 2019-04-25 DIAGNOSIS — E66811 Obesity, class 1: Secondary | ICD-10-CM

## 2019-04-25 DIAGNOSIS — R002 Palpitations: Secondary | ICD-10-CM

## 2019-04-25 DIAGNOSIS — E669 Obesity, unspecified: Secondary | ICD-10-CM

## 2019-04-25 DIAGNOSIS — R748 Abnormal levels of other serum enzymes: Secondary | ICD-10-CM

## 2019-04-25 MED ORDER — LOSARTAN POTASSIUM 25 MG PO TABS: 25.0000 mg | ORAL_TABLET | Freq: Every day | ORAL | 1 refills | Status: DC

## 2019-04-25 NOTE — Patient Instructions (Signed)

## 2019-04-25 NOTE — Progress Notes (Signed)
BP 137/87    Pulse 66    Temp 98.2 F (36.8 C) (Oral)    Ht 5' (1.524 m)    Wt 170 lb (77.1 kg)    SpO2 98%    BMI 33.20 kg/m    Subjective:    Patient ID: Allison Pham, female    DOB: Nov 18, 1960, 58 y.o.   MRN: [id number]  HPI: Allison Pham is a 58 y.o. female  Chief Complaint  Patient presents with   Anxiety   Hypertension   Hyperlipidemia   ANXIETY Continues on Buspar 2.5 to 5 MG TID, Trazodone recently for sleep, as she endorsed poor sleep pattern.  States she has been taking this on as needed basis only and has noticed it helps her to sleep, but does not take it every night. Mood status: stable Satisfied with current treatment?: yes Symptom severity: mild  Duration of current treatment : chronic Side effects: no Medication compliance: good compliance Psychotherapy/counseling: none Depressed mood: no Anxious mood: no Anhedonia: no Significant weight loss or gain: no Insomnia: improved with Trazodone Fatigue: no Feelings of worthlessness or guilt: no Impaired concentration/indecisiveness: no Suicidal ideations: no Hopelessness: no Crying spells: no Depression screen [facility] 2/9 04/25/2019 02/28/2019 01/20/2019 07/02/2018 06/11/2018  Decreased Interest 1 1 3 2  -  Down, Depressed, Hopeless 1 1 0 2 -  PHQ - 2 Score 2 2 3 4  -  Altered sleeping 1 1 3 2  -  Tired, decreased energy 1 1 1 2  -  Change in appetite 0 0 0 0 -  Feeling bad or failure about yourself  1 0 0 1 -  Trouble concentrating 0 0 1 0 -  Moving slowly or fidgety/restless 0 0 0 0 -  Suicidal thoughts 0 0 0 0 0  PHQ-9 Score 5 4 8 9  -  Difficult doing work/chores Not difficult at all Not difficult at all Somewhat difficult Somewhat difficult -  Some recent data might be hidden   GAD 7 : Generalized Anxiety Score 04/25/2019 02/28/2019 01/20/2019 04/23/2015  Nervous, Anxious, on Edge 1 1 2 1   Control/stop worrying 0 1 1 2   Worry too much - different things 2 1 3 2   Trouble relaxing 1 1 2 2   Restless 1 0 0 0   Easily annoyed or irritable 0 1 0 0  Afraid - awful might happen 0 0 0 0  Total GAD 7 Score 5 5 8 7   Anxiety Difficulty Not difficult at all Not difficult at all Somewhat difficult Not difficult at all    ELEVATED LFTs: LFT in January AST/ALT 36/62, these are trending down with last September labs AST/ALT 29/42.  Denies Tylenol or alcohol use.  Has history of this in past and they have had to switch cholesterol medications many times due to elevation with medication on board.  Was switched to Pravastatin in April 2020, has been on Lipitor.  They had discussed at her April visit trying milk thistle, which she is taking daily. Denies RUQ pain, loss of appetite, or jaundice.  History of low Vitamin d level at 17 last year, takes daily supplement.  HYPERTENSION / HYPERLIPIDEMIA Continue on Metoprolol XL 25 MG.  In past has taken Lisinopril-HCTZ and did not like it, made her feel "doped up".  Continues on Pravastatin 20 MG daily.  Reports noticing having occasional "extra beats" and notices these especially at night, they feel better if she sits up.  Denies feeling any at this time.  Denies SOB, orthopnea,  or edema.  Does report x 2 episodes of chest pain recently, but she is unsure if it was related to elevation in her BP or anxiety.  Has family history of CVA and MI.   Satisfied with current treatment? yes Duration of hypertension: chronic BP monitoring frequency: daily BP range: 12/80 to 160/90's at home BP medication side effects: no Duration of hyperlipidemia: chronic Cholesterol medication side effects: no Cholesterol supplements: none Medication compliance: good compliance Aspirin: yes Recent stressors: no Recurrent headaches: no Visual changes: no Palpitations: yes Dyspnea: no Chest pain: yes, occasionally x 2 episodes Lower extremity edema: no Dizzy/lightheaded: no  Relevant past medical, surgical, family and social history reviewed and updated as indicated. Interim medical  history since our last visit reviewed. Allergies and medications reviewed and updated.  Review of Systems  Constitutional: Negative for activity change, appetite change, diaphoresis, fatigue and fever.  Respiratory: Negative for cough, chest tightness and shortness of breath.   Cardiovascular: Negative for chest pain, palpitations and leg swelling.  Gastrointestinal: Negative for abdominal distention, abdominal pain, constipation, diarrhea, nausea and vomiting.  Neurological: Negative for dizziness, syncope, weakness, light-headedness, numbness and headaches.  Psychiatric/Behavioral: Negative for decreased concentration, self-injury, sleep disturbance and suicidal ideas. The patient is not nervous/anxious.     Per HPI unless specifically indicated above     Objective:    BP 137/87    Pulse 66    Temp 98.2 F (36.8 C) (Oral)    Ht 5' (1.524 m)    Wt 170 lb (77.1 kg)    SpO2 98%    BMI 33.20 kg/m   Wt Readings from Last 3 Encounters:  04/25/19 170 lb (77.1 kg)  01/20/19 164 lb 12.8 oz (74.8 kg)  07/02/18 164 lb 3.2 oz (74.5 kg)    Physical Exam Vitals signs and nursing note reviewed.  Constitutional:      Appearance: She is well-developed.  HENT:     Head: Normocephalic.  Eyes:     General:        Right eye: No discharge.        Left eye: No discharge.     Conjunctiva/sclera: Conjunctivae normal.     Pupils: Pupils are equal, round, and reactive to light.  Neck:     Musculoskeletal: Normal range of motion and neck supple.     Thyroid: No thyromegaly.     Vascular: No carotid bruit.  Cardiovascular:     Rate and Rhythm: Normal rate and regular rhythm.     Chest Wall: PMI is not displaced.     Heart sounds: Normal heart sounds. No murmur. No gallop.   Pulmonary:     Effort: Pulmonary effort is normal. No accessory muscle usage or respiratory distress.     Breath sounds: Normal breath sounds.  Abdominal:     General: Bowel sounds are normal.     Palpations: Abdomen is  soft. There is no hepatomegaly or splenomegaly.  Musculoskeletal:     Right lower leg: No edema.     Left lower leg: No edema.  Lymphadenopathy:     Cervical: No cervical adenopathy.  Skin:    General: Skin is warm and dry.  Neurological:     Mental Status: She is alert and oriented to person, place, and time.  Psychiatric:        Attention and Perception: Attention normal.        Mood and Affect: Mood normal.        Behavior: Behavior normal.  Thought Content: Thought content normal.        Judgment: Judgment normal.    EKG NSR with normal axis and rate 61, some artifact due to shivering (similar to August 2019 EKG on review)  Results for orders placed or performed in visit on 03/21/19  Comprehensive metabolic panel  Result Value Ref Range   Glucose 101 (H) 65 - 99 mg/dL   BUN 16 6 - 24 mg/dL   Creatinine, Ser 0.84 0.57 - 1.00 mg/dL   GFR calc non Af Amer 76 >59 mL/min/1.73   GFR calc Af Amer 88 >59 mL/min/1.73   BUN/Creatinine Ratio 19 9 - 23   Sodium 140 134 - 144 mmol/L   Potassium 4.4 3.5 - 5.2 mmol/L   Chloride 103 96 - 106 mmol/L   CO2 22 20 - 29 mmol/L   Calcium 9.8 8.7 - 10.2 mg/dL   Total Protein 6.7 6.0 - 8.5 g/dL   Albumin 4.5 3.8 - 4.9 g/dL   Globulin, Total 2.2 1.5 - 4.5 g/dL   Albumin/Globulin Ratio 2.0 1.2 - 2.2   Bilirubin Total 0.5 0.0 - 1.2 mg/dL   Alkaline Phosphatase 123 (H) 39 - 117 IU/L   AST 29 0 - 40 IU/L   ALT 42 (H) 0 - 32 IU/L  Lipid Panel w/o Chol/HDL Ratio  Result Value Ref Range   Cholesterol, Total 227 (H) 100 - 199 mg/dL   Triglycerides 178 (H) 0 - 149 mg/dL   HDL 45 >39 mg/dL   VLDL Cholesterol Cal 32 5 - 40 mg/dL   LDL Chol Calc (NIH) 150 (H) 0 - 99 mg/dL      Assessment & Plan:   Problem List Items Addressed This Visit      Cardiovascular and Mediastinum   Essential hypertension, benign - Primary    Chronic, ongoing with BP having elevations intermittently at home.  Continue current medication regimen and adjust as  needed + add on Losartan 25 MG (script sent).  Recommend checking BP at home three mornings a week and documenting for provider.  Return in 4 weeks.      Relevant Medications   losartan (COZAAR) 25 MG tablet   Other Relevant Orders   Comprehensive metabolic panel     Other   Dyslipidemia    Chronic, ongoing.  Continue current medication regimen and adjust as needed.  CMP and lipid panel today.      Relevant Orders   Lipid Panel w/o Chol/HDL Ratio   Elevated serum GGT level    Check CMP today, recent labs showed improvement.      Relevant Orders   Comprehensive metabolic panel   Obesity (BMI 30.0-34.9)    Recommend continued focus on health diet choices and regular physical activity (30 minutes 5 days a week).       Vitamin D deficiency    Continue supplement and check Vit D level today.      Relevant Orders   VITAMIN D 25 Hydroxy (Vit-D Deficiency, Fractures)   Generalized anxiety disorder    Chronic, stable with addition of Trazodone and continuation of Buspar. Continue current medication regimen and adjust as needed.  Denies SI/HI.      Palpitations    Check thyroid today.  Referral to cardiology place, may benefit from 24 hour Holter.      Relevant Orders   Ambulatory referral to Cardiology   EKG 12-Lead (Completed)   Thyroid Panel With TSH      I discussed the assessment and  treatment plan with the patient. The patient was provided an opportunity to ask questions and all were answered. The patient agreed with the plan and demonstrated an understanding of the instructions.   The patient was advised to call back or seek an in-person evaluation if the symptoms worsen or if the condition fails to improve as anticipated.   I provided 15 minutes of time during this encounter.  Follow up plan: Return in about 4 weeks (around 05/23/2019) for HTN follow-up.

## 2019-04-25 NOTE — Assessment & Plan Note (Signed)
Continue supplement and check Vit D level today.

## 2019-04-25 NOTE — Assessment & Plan Note (Signed)
Check CMP today, recent labs showed improvement.

## 2019-04-25 NOTE — Assessment & Plan Note (Signed)
Check thyroid today.  Referral to cardiology place, may benefit from 24 hour Holter.

## 2019-04-25 NOTE — Assessment & Plan Note (Signed)
Chronic, stable with addition of Trazodone and continuation of Buspar. Continue current medication regimen and adjust as needed.  Denies SI/HI.

## 2019-04-25 NOTE — Assessment & Plan Note (Signed)
Chronic, ongoing.  Continue current medication regimen and adjust as needed.  CMP and lipid panel today. 

## 2019-04-25 NOTE — Assessment & Plan Note (Signed)
Chronic, ongoing with BP having elevations intermittently at home.  Continue current medication regimen and adjust as needed + add on Losartan 25 MG (script sent).  Recommend checking BP at home three mornings a week and documenting for provider.  Return in 4 weeks.

## 2019-04-25 NOTE — Assessment & Plan Note (Signed)
Recommend continued focus on health diet choices and regular physical activity (30 minutes 5 days a week). 

## 2019-04-26 LAB — COMPREHENSIVE METABOLIC PANEL
ALT: 38 IU/L — ABNORMAL HIGH (ref 0–32)
AST: 28 IU/L (ref 0–40)
Albumin/Globulin Ratio: 1.8 (ref 1.2–2.2)
Albumin: 4.4 g/dL (ref 3.8–4.9)
Alkaline Phosphatase: 111 IU/L (ref 39–117)
BUN/Creatinine Ratio: 24 — ABNORMAL HIGH (ref 9–23)
BUN: 17 mg/dL (ref 6–24)
Bilirubin Total: 0.6 mg/dL (ref 0.0–1.2)
CO2: 18 mmol/L — ABNORMAL LOW (ref 20–29)
Calcium: 9.4 mg/dL (ref 8.7–10.2)
Chloride: 107 mmol/L — ABNORMAL HIGH (ref 96–106)
Creatinine, Ser: 0.7 mg/dL (ref 0.57–1.00)
GFR calc Af Amer: 110 mL/min/{1.73_m2} (ref >59)
GFR calc non Af Amer: 95 mL/min/{1.73_m2} (ref >59)
Globulin, Total: 2.5 g/dL (ref 1.5–4.5)
Glucose: 79 mg/dL (ref 65–99)
Potassium: 4.2 mmol/L (ref 3.5–5.2)
Sodium: 141 mmol/L (ref 134–144)
Total Protein: 6.9 g/dL (ref 6.0–8.5)

## 2019-04-26 LAB — LIPID PANEL W/O CHOL/HDL RATIO
Cholesterol, Total: 243 mg/dL — ABNORMAL HIGH (ref 100–199)
HDL: 46 mg/dL (ref >39)
LDL Chol Calc (NIH): 167 mg/dL — ABNORMAL HIGH (ref 0–99)
Triglycerides: 163 mg/dL — ABNORMAL HIGH (ref 0–149)
VLDL Cholesterol Cal: 30 mg/dL (ref 5–40)

## 2019-04-26 LAB — THYROID PANEL WITH TSH
Free Thyroxine Index: 2.8 (ref 1.2–4.9)
T3 Uptake Ratio: 28 % (ref 24–39)
T4, Total: 10.1 ug/dL (ref 4.5–12.0)
TSH: 1.43 u[IU]/mL (ref 0.450–4.500)

## 2019-04-26 LAB — VITAMIN D 25 HYDROXY (VIT D DEFICIENCY, FRACTURES): Vit D, 25-Hydroxy: 18.3 ng/mL — ABNORMAL LOW (ref 30.0–100.0)

## 2019-05-01 ENCOUNTER — Telehealth: Payer: Self-pay | Admitting: Nurse Practitioner

## 2019-05-01 NOTE — Chronic Care Management (AMB) (Signed)
°  Chronic Care Management   Outreach Note  05/01/2019 Name: Allison Pham MRN: [id number] DOB: 13-Jul-1960  Referred by: Venita Lick, NP Reason for referral : Chronic Care Management (Initial CCM outreach was unsuccessful. )   An unsuccessful telephone outreach was attempted today. The patient was referred to the case management team by for assistance with care management and care coordination.   Follow Up Plan: A HIPPA compliant phone message was left for the patient providing contact information and requesting a return call.  The care management team will reach out to the patient again over the next 7 days.  If patient returns call to provider office, please advise to call [place] at [place]  [email or web address]   ??[phone]

## 2019-05-06 NOTE — Chronic Care Management (AMB) (Signed)
Chronic Care Management   Note  05/06/2019 Name: Allison Pham MRN: [id number] DOB: 1961-02-27  [name] is a 58 y.o. year old female who is a primary care patient of Cannady, Barbaraann Faster, NP. I reached out to [name] by phone today in response to a referral sent by [name]'s health plan.     [name] was given information about Chronic Care Management services today including:  1. CCM service includes personalized support from designated clinical staff supervised by her physician, including individualized plan of care and coordination with other care providers 2. 24/7 contact phone numbers for assistance for urgent and routine care needs. 3. Service will only be billed when office clinical staff spend 20 minutes or more in a month to coordinate care. 4. Only one practitioner may furnish and bill the service in a calendar month. 5. The patient may stop CCM services at any time (effective at the end of the month) by phone call to the office staff. 6. The patient will be responsible for cost sharing (co-pay) of up to 20% of the service fee (after annual deductible is met).  Patient did not agree to enrollment in care management services and does not wish to consider at this time.  Follow up plan: The patient has been provided with contact information for the chronic care management team and has been advised to call with any health related questions or concerns.   [place], [place] [phone] Direct Dial: [email or web address]  Website: [email or web address]

## 2019-05-09 ENCOUNTER — Encounter: Payer: Self-pay | Admitting: Cardiology

## 2019-05-09 ENCOUNTER — Other Ambulatory Visit: Payer: Self-pay

## 2019-05-09 ENCOUNTER — Ambulatory Visit (INDEPENDENT_AMBULATORY_CARE_PROVIDER_SITE_OTHER): Payer: Medicare Other

## 2019-05-09 ENCOUNTER — Ambulatory Visit (INDEPENDENT_AMBULATORY_CARE_PROVIDER_SITE_OTHER): Payer: Medicare Other | Admitting: Cardiology

## 2019-05-09 VITALS — BP 120/80 | HR 96 | Temp 96.8°F | Ht 60.0 in | Wt 171.2 lb

## 2019-05-09 DIAGNOSIS — E7800 Pure hypercholesterolemia, unspecified: Secondary | ICD-10-CM | POA: Diagnosis not present

## 2019-05-09 DIAGNOSIS — R002 Palpitations: Secondary | ICD-10-CM

## 2019-05-09 DIAGNOSIS — I10 Essential (primary) hypertension: Secondary | ICD-10-CM | POA: Diagnosis not present

## 2019-05-09 NOTE — Progress Notes (Signed)
Cardiology Office Note:    Date:  05/09/2019   ID:  Allison Pham, DOB 03-28-1961, MRN [id number]  PCP:  Venita Lick, NP  Cardiologist:  Kate Sable, MD  Electrophysiologist:  None   Referring MD: Venita Lick, NP   Chief Complaint  Patient presents with  . New Patient (Initial Visit)    Palpitations; Meds verbally reviewed with patient.    History of Present Illness:    Allison Pham is a 58 y.o. female with a hx of anxiety, hypertension, hyperlipidemia presents due to palpitations.  Symptoms of palpitations have been going on for years now.  Symptoms typically [place] 2-3 times a week and last 2 hours.  Symptoms typically occur when patient tries to go to sleep.  This is caused her to usually sleep in her recliner the past couple of months.  Symptoms are not related with exertion.  She usually takes her blood pressure pill and antianxiety to help with symptoms.  Sometimes she just lets her heart race and then go to sleep.  He denies any history of heart disease.  She denies any chest pain, dizziness, presyncope or syncope.  Past Medical History:  Diagnosis Date  . Anxiety   . Carotid atherosclerosis, bilateral 07/16/2018   rescan Jan 2021  . Emphysema of lung ([place]) 03/21/2018  . GERD (gastroesophageal reflux disease)   . Hypercholesteremia   . Hypertension   . Tick bite   . Vertigo   . Wears dentures    partial upper    Past Surgical History:  Procedure Laterality Date  . ABDOMINAL HYSTERECTOMY     Partial  . COLONOSCOPY WITH PROPOFOL N/A 05/10/2017   Procedure: COLONOSCOPY WITH PROPOFOL;  Surgeon: [name], MD;  Location: [facility];  Service: Endoscopy;  Laterality: N/A;  . HEMORRHOID SURGERY    . POLYPECTOMY N/A 05/10/2017   Procedure: POLYPECTOMY;  Surgeon: [name], MD;  Location: [facility];  Service: Endoscopy;  Laterality: N/A;    Current Medications: Current Meds  Medication Sig  . aspirin 81 MG chewable tablet  Chew 81 mg by mouth as needed.   . busPIRone (BUSPAR) 5 MG tablet Take 0.5-1 tablets (2.5-5 mg total) by mouth 3 (three) times daily. (Patient taking differently: Take 2.5-5 mg by mouth 3 (three) times daily as needed. )  . cholecalciferol (VITAMIN D) 1000 units tablet Take 1,000 Units by mouth daily.  . cyclobenzaprine (FLEXERIL) 5 MG tablet Take 1 tablet (5 mg total) by mouth at bedtime as needed for muscle spasms. Do not drive while taking as can cause drowsiness.  . fluticasone (FLONASE) 50 MCG/ACT nasal spray Place 1 spray into both nostrils 2 (two) times daily as needed.  . gabapentin (NEURONTIN) 300 MG capsule Take 1 capsule (300 mg total) by mouth at bedtime.  Marland Kitchen loratadine (CLARITIN) 10 MG tablet Take 1 tablet (10 mg total) by mouth daily as needed for allergies.  Marland Kitchen losartan (COZAAR) 25 MG tablet Take 1 tablet (25 mg total) by mouth daily.  . metoprolol succinate (TOPROL-XL) 25 MG 24 hr tablet Take 1 tablet (25 mg total) by mouth daily.  . pravastatin (PRAVACHOL) 20 MG tablet Take 1 tablet (20 mg total) by mouth daily. For cholesterol; this replaces atorvastatin  . traZODone (DESYREL) 50 MG tablet Take 0.5 tablets (25 mg total) by mouth at bedtime.     Allergies:   Penicillins   Social History   Socioeconomic History  . Marital status: Single    Spouse  name: Not on file  . Number of children: Not on file  . Years of education: Not on file  . Highest education level: Not on file  Occupational History  . Not on file  Social Needs  . Financial resource strain: Not on file  . Food insecurity    Worry: Not on file    Inability: Not on file  . Transportation needs    Medical: Not on file    Non-medical: Not on file  Tobacco Use  . Smoking status: Former Smoker    Packs/day: 1.00    Years: 38.00    Pack years: 38.00    Types: Cigarettes    Quit date: 09/13/2015    Years since quitting: 3.6  . Smokeless tobacco: Never Used  Substance and Sexual Activity  . Alcohol use: Never     Alcohol/week: 0.0 standard drinks    Frequency: Never    Comment: rare - holidays  . Drug use: No  . Sexual activity: Not Currently    Birth control/protection: Surgical  Lifestyle  . Physical activity    Days per week: Not on file    Minutes per session: Not on file  . Stress: Not on file  Relationships  . Social Herbalist on phone: Not on file    Gets together: Not on file    Attends religious service: Not on file    Active member of club or organization: Not on file    Attends meetings of clubs or organizations: Not on file    Relationship status: Not on file  Other Topics Concern  . Not on file  Social History Narrative  . Not on file     Family History: The patient's family history includes Breast cancer in her maternal aunt and maternal aunt; COPD in her sister; Cancer in her maternal aunt, maternal uncle, and mother; Heart attack in her father; Heart disease in her brother and father; Hypertension in her brother, father, mother, and sister. There is no history of Diabetes or Stroke.  ROS:   Please see the history of present illness.     All other systems reviewed and are negative.  EKGs/Labs/Other Studies Reviewed:    The following studies were reviewed today:   EKG:  EKG is  ordered today.  The ekg ordered today demonstrates normal sinus rhythm, rightward axis.  Recent Labs: 04/25/2019: ALT 38; BUN 17; Creatinine, Ser 0.70; Potassium 4.2; Sodium 141; TSH 1.430  Recent Lipid Panel    Component Value Date/Time   CHOL 243 (H) 04/25/2019 1425   TRIG 163 (H) 04/25/2019 1425   HDL 46 04/25/2019 1425   CHOLHDL 4.1 07/02/2018 1145   VLDL 29 05/15/2016 1030   LDLCALC 167 (H) 04/25/2019 1425   LDLCALC 126 (H) 07/02/2018 1145    Physical Exam:    VS:  BP 120/80 (BP Location: Right Arm, Patient Position: Sitting, Cuff Size: Normal)   Pulse 96   Temp (!) 96.8 F (36 C)   Ht 5' (1.524 m)   Wt 171 lb 4 oz (77.7 kg)   SpO2 96%   BMI 33.44 kg/m      Wt Readings from Last 3 Encounters:  05/09/19 171 lb 4 oz (77.7 kg)  04/25/19 170 lb (77.1 kg)  01/20/19 164 lb 12.8 oz (74.8 kg)     GEN:  Well nourished, well developed in no acute distress HEENT: Normal NECK: No JVD; No carotid bruits LYMPHATICS: No lymphadenopathy CARDIAC: RRR, no murmurs,  rubs, gallops RESPIRATORY:  Clear to auscultation without rales, wheezing or rhonchi  ABDOMEN: Soft, non-tender, non-distended MUSCULOSKELETAL:  No edema; No deformity  SKIN: Warm and dry NEUROLOGIC:  Alert and oriented x 3 PSYCHIATRIC:  Normal affect   ASSESSMENT:   No symptoms seem to be anxiety related, especially as they improved with taking her antianxiety medications.  However, she still needs evaluation both for objective findings and also for reassurance if negative. 1. Palpitations   2. Essential hypertension   3. Pure hypercholesterolemia    PLAN:    In order of problems listed above:  1. 2-week cardiac monitor. 2. Blood pressure well controlled continue losartan and Toprol-XL as currently prescribed. 3. Continue Pravachol as prescribed.  Follow-up in 6 weeks.  This note was generated in part or whole with voice recognition software. Voice recognition is usually quite accurate but there are transcription errors that can and very often do occur. I apologize for any typographical errors that were not detected and corrected.  Medication Adjustments/Labs and Tests Ordered: Current medicines are reviewed at length with the patient today.  Concerns regarding medicines are outlined above.  Orders Placed This Encounter  Procedures  . LONG TERM MONITOR (3-14 DAYS)  . EKG 12-Lead   No orders of the defined types were placed in this encounter.   Patient Instructions  Medication Instructions:  Your physician recommends that you continue on your current medications as directed. Please refer to the Current Medication list given to you today.  *If you need a refill on your  cardiac medications before your next appointment, please call your pharmacy*  Lab Work: None ordered If you have labs (blood work) drawn today and your tests are completely normal, you will receive your results only by: Marland Kitchen MyChart Message (if you have MyChart) OR . A paper copy in the mail If you have any lab test that is abnormal or we need to change your treatment, we will call you to review the results.  Testing/Procedures: Your physician has recommended that you wear an zio monitor. Zio monitors are medical devices that record the heart's electrical activity. Doctors most often [place] these monitors to diagnose arrhythmias. Arrhythmias are problems with the speed or rhythm of the heartbeat. The monitor is a small, portable device. You can wear one while you do your normal daily activities. This is usually used to diagnose what is causing palpitations/syncope (passing out).    Follow-Up: At [facility], you and your health needs are our priority.  As part of our continuing mission to provide you with exceptional heart care, we have created designated Provider Care Teams.  These Care Teams include your primary Cardiologist (physician) and Advanced Practice Providers (APPs -  Physician Assistants and Nurse Practitioners) who all work together to provide you with the care you need, when you need it.  Your next appointment:   6-8 weeks  The format for your next appointment:   In Person  Provider:    You may see Kate Sable, MD or one of the following Advanced Practice Providers on your designated Care Team:    Murray Hodgkins, NP  Christell Faith, PA-C  Marrianne Mood, PA-C   Other Instructions Your physician has recommended that you wear a Zio monitor. This monitor is a medical device that records the heart's electrical activity. Doctors most often use these monitors to diagnose arrhythmias. Arrhythmias are problems with the speed or rhythm of the heartbeat. The monitor is a  small device applied to your chest.  You can wear one while you do your normal daily activities. While wearing this monitor if you have any symptoms to push the button and record what you felt. Once you have worn this monitor for the period of time provider prescribed (Usually 14 days), you will return the monitor device in the postage paid box. Once it is returned they will download the data collected and provide [place] with a report which the provider will then review and we will call you with those results. Important tips:  1. Avoid showering during the first 24 hours of wearing the monitor. 2. Avoid excessive sweating to help maximize wear time. 3. Do not submerge the device, no hot tubs, and no swimming pools. 4. Keep any lotions or oils away from the patch. 5. After 24 hours you may shower with the patch on. Take brief showers with your back facing the shower head.  6. Do not remove patch once it has been placed because that will interrupt data and decrease adhesive wear time. 7. Push the button when you have any symptoms and write down what you were feeling. 8. Once you have completed wearing your monitor, remove and place into box which has postage paid and place in your outgoing mailbox.  9. If for some reason you have misplaced your box then call our office and we can provide another box and/or mail it off for you.           Signed, Kate Sable, MD  05/09/2019 3:48 PM    [place]

## 2019-05-09 NOTE — Patient Instructions (Signed)
Medication Instructions:  Your physician recommends that you continue on your current medications as directed. Please refer to the Current Medication list given to you today.  *If you need a refill on your cardiac medications before your next appointment, please call your pharmacy*  Lab Work: None ordered If you have labs (blood work) drawn today and your tests are completely normal, you will receive your results only by: Marland Kitchen MyChart Message (if you have MyChart) OR . A paper copy in the mail If you have any lab test that is abnormal or we need to change your treatment, we will call you to review the results.  Testing/Procedures: Your physician has recommended that you wear an zio monitor. Zio monitors are medical devices that record the heart's electrical activity. Doctors most often [place] these monitors to diagnose arrhythmias. Arrhythmias are problems with the speed or rhythm of the heartbeat. The monitor is a small, portable device. You can wear one while you do your normal daily activities. This is usually used to diagnose what is causing palpitations/syncope (passing out).    Follow-Up: At [facility], you and your health needs are our priority.  As part of our continuing mission to provide you with exceptional heart care, we have created designated Provider Care Teams.  These Care Teams include your primary Cardiologist (physician) and Advanced Practice Providers (APPs -  Physician Assistants and Nurse Practitioners) who all work together to provide you with the care you need, when you need it.  Your next appointment:   6-8 weeks  The format for your next appointment:   In Person  Provider:    You may see Kate Sable, MD or one of the following Advanced Practice Providers on your designated Care Team:    Murray Hodgkins, NP  Christell Faith, PA-C  Marrianne Mood, PA-C   Other Instructions Your physician has recommended that you wear a Zio monitor. This monitor is a  medical device that records the heart's electrical activity. Doctors most often use these monitors to diagnose arrhythmias. Arrhythmias are problems with the speed or rhythm of the heartbeat. The monitor is a small device applied to your chest. You can wear one while you do your normal daily activities. While wearing this monitor if you have any symptoms to push the button and record what you felt. Once you have worn this monitor for the period of time provider prescribed (Usually 14 days), you will return the monitor device in the postage paid box. Once it is returned they will download the data collected and provide [place] with a report which the provider will then review and we will call you with those results. Important tips:  1. Avoid showering during the first 24 hours of wearing the monitor. 2. Avoid excessive sweating to help maximize wear time. 3. Do not submerge the device, no hot tubs, and no swimming pools. 4. Keep any lotions or oils away from the patch. 5. After 24 hours you may shower with the patch on. Take brief showers with your back facing the shower head.  6. Do not remove patch once it has been placed because that will interrupt data and decrease adhesive wear time. 7. Push the button when you have any symptoms and write down what you were feeling. 8. Once you have completed wearing your monitor, remove and place into box which has postage paid and place in your outgoing mailbox.  9. If for some reason you have misplaced your box then call our office and we  can provide another box and/or mail it off for you.

## 2019-05-21 ENCOUNTER — Other Ambulatory Visit: Payer: Self-pay | Admitting: Nurse Practitioner

## 2019-05-21 ENCOUNTER — Telehealth: Payer: Self-pay | Admitting: Nurse Practitioner

## 2019-05-21 MED ORDER — LOSARTAN POTASSIUM 25 MG PO TABS: 25.0000 mg | ORAL_TABLET | Freq: Every day | ORAL | 3 refills | Status: DC

## 2019-05-21 NOTE — Telephone Encounter (Signed)
Pt. is the office here for "a couple of pills" of her medication for blood pressure and claims she has a refill that is to be filled on  The 28th.Pt states her medication that starts with the ''L" is the only one she needs.Please advise.

## 2019-05-21 NOTE — Telephone Encounter (Signed)
Notified PT,Pt understood.

## 2019-05-21 NOTE — Telephone Encounter (Signed)
Losartan script sent.

## 2019-05-23 ENCOUNTER — Telehealth: Payer: Self-pay | Admitting: *Deleted

## 2019-05-23 DIAGNOSIS — R002 Palpitations: Secondary | ICD-10-CM | POA: Diagnosis not present

## 2019-05-23 NOTE — Telephone Encounter (Signed)
Left a voicemail for patient to notify her that it is time to schedule annual low dose lung cancer screening CT scan. Instructed patient to call back to verify information prior to the scan being scheduled. 

## 2019-05-26 ENCOUNTER — Other Ambulatory Visit: Payer: Self-pay

## 2019-05-26 ENCOUNTER — Encounter: Payer: Self-pay | Admitting: Nurse Practitioner

## 2019-05-26 ENCOUNTER — Ambulatory Visit (INDEPENDENT_AMBULATORY_CARE_PROVIDER_SITE_OTHER): Payer: Medicare Other | Admitting: Nurse Practitioner

## 2019-05-26 DIAGNOSIS — I10 Essential (primary) hypertension: Secondary | ICD-10-CM | POA: Diagnosis not present

## 2019-05-26 MED ORDER — LOSARTAN POTASSIUM 50 MG PO TABS: 50.0000 mg | ORAL_TABLET | Freq: Every day | ORAL | 3 refills | Status: DC

## 2019-05-26 NOTE — Patient Instructions (Signed)

## 2019-05-26 NOTE — Assessment & Plan Note (Signed)
Chronic, ongoing with recent elevations.  Suspect some related to anxiety and have recommended she take her Buspar as 5 MG TID vs 2.5 MG.  Continue Metoprolol XL 25 MG daily, HR in 70's at home.  Increase Losartan to 50 MG daily and plan to follow-up with patient in one week.  If continued elevations noted will increase to 100 MG daily.  Continue to monitor BP at home and return in one week for follow-up and labs.  Is aware of s/s to immediately go to ER for.

## 2019-05-26 NOTE — Progress Notes (Signed)
BP (!) 189/109   Pulse 72    Subjective:    Patient ID: Allison Pham, female    DOB: [birth year], 58 y.o.   MRN: [id number]  HPI: Allison Pham is a 58 y.o. female  Chief Complaint  Patient presents with  . Hypertension    . This visit was completed via Doximity due to the restrictions of the COVID-19 pandemic. All issues as above were discussed and addressed. Physical exam was done as above through visual confirmation on Doximity. If it was felt that the patient should be evaluated in the office, they were directed there. The patient verbally consented to this visit. . Location of the patient: home . Location of the provider: work . Those involved with this call:  . Provider: Marnee Guarneri, DNP . CMA: Yvonna Alanis, CMA . Front Desk/Registration: Jill Side  . Time spent on call: 15 minutes with patient face to face via video conference. More than 50% of this time was spent in counseling and coordination of care. 10 minutes total spent in review of patient's record and preparation of their chart.  . I verified patient identity using two factors (patient name and date of birth). Patient consents verbally to being seen via telemedicine visit today.    HYPERTENSION Seen in ER at [facility] 05/23/2019 for elevation in BP due to not taking Metoprolol in 2 weeks and anxiety.  Takes Metoprolol XL 25 MG daily.  In past has taken Lisinopril-HCTZ and did not like it, made her feel "doped up".  At recent 04/25/19 visit added on Losartan 25 MG.  Had initial cardiology visit on 05/09/19 with no medication changes, reports recently wearing Holter which she turned back in on Friday (no results as of yet).  Takes Buspar for anxiety 2.5 MG three times a day, is able to take up to 5 MG TID but had been worried about this and affects.  Does endorse anxiety does elevate her HR and BP at times.  Denies headache, CP, SOB, facial droop, extremity numbness, or palpitations. Hypertension status:  uncontrolled  Satisfied with current treatment? yes Duration of hypertension: chronic BP monitoring frequency:  daily BP range: 160-180/90-100's BP medication side effects:  no Medication compliance: good compliance Previous BP meds:Lisinopril-HCTZ, Losartan, Metoprolol Aspirin: yes Recurrent headaches: no Visual changes: no Palpitations: no Dyspnea: no Chest pain: no Lower extremity edema: no Dizzy/lightheaded: no  Relevant past medical, surgical, family and social history reviewed and updated as indicated. Interim medical history since our last visit reviewed. Allergies and medications reviewed and updated.  Review of Systems  Constitutional: Negative for activity change, appetite change, diaphoresis, fatigue and fever.  Respiratory: Negative for cough, chest tightness, shortness of breath and wheezing.   Cardiovascular: Negative for chest pain, palpitations and leg swelling.  Gastrointestinal: Negative for abdominal distention, abdominal pain, constipation, diarrhea, nausea and vomiting.  Neurological: Negative for dizziness, syncope, facial asymmetry, speech difficulty, weakness, light-headedness, numbness and headaches.  Psychiatric/Behavioral: Negative.     Per HPI unless specifically indicated above     Objective:    BP (!) 189/109   Pulse 72   Wt Readings from Last 3 Encounters:  05/09/19 171 lb 4 oz (77.7 kg)  04/25/19 170 lb (77.1 kg)  01/20/19 164 lb 12.8 oz (74.8 kg)    Physical Exam Vitals signs and nursing note reviewed.  Constitutional:      General: She is awake. She is not in acute distress.    Appearance: She is well-developed. She is not  ill-appearing.  HENT:     Head: Normocephalic.     Right Ear: Hearing normal.     Left Ear: Hearing normal.  Eyes:     General: Lids are normal.        Right eye: No discharge.        Left eye: No discharge.     Conjunctiva/sclera: Conjunctivae normal.  Neck:     Musculoskeletal: Normal range of motion.   Cardiovascular:     Comments: Unable to auscultate due to virtual exam only  Pulmonary:     Effort: Pulmonary effort is normal. No accessory muscle usage or respiratory distress.     Comments: Unable to auscultate due to virtual exam only.  No SOB with talking.  Neurological:     Mental Status: She is alert and oriented to person, place, and time.  Psychiatric:        Attention and Perception: Attention normal.        Mood and Affect: Mood normal.        Behavior: Behavior normal. Behavior is cooperative.        Thought Content: Thought content normal.        Judgment: Judgment normal.     Results for orders placed or performed in visit on 04/25/19  Thyroid Panel With TSH  Result Value Ref Range   TSH 1.430 0.450 - 4.500 uIU/mL   T4, Total 10.1 4.5 - 12.0 ug/dL   T3 Uptake Ratio 28 24 - 39 %   Free Thyroxine Index 2.8 1.2 - 4.9  VITAMIN D 25 Hydroxy (Vit-D Deficiency, Fractures)  Result Value Ref Range   Vit D, 25-Hydroxy 18.3 (L) 30.0 - 100.0 ng/mL  Comprehensive metabolic panel  Result Value Ref Range   Glucose 79 65 - 99 mg/dL   BUN 17 6 - 24 mg/dL   Creatinine, Ser 0.70 0.57 - 1.00 mg/dL   GFR calc non Af Amer 95 >59 mL/min/1.73   GFR calc Af Amer 110 >59 mL/min/1.73   BUN/Creatinine Ratio 24 (H) 9 - 23   Sodium 141 134 - 144 mmol/L   Potassium 4.2 3.5 - 5.2 mmol/L   Chloride 107 (H) 96 - 106 mmol/L   CO2 18 (L) 20 - 29 mmol/L   Calcium 9.4 8.7 - 10.2 mg/dL   Total Protein 6.9 6.0 - 8.5 g/dL   Albumin 4.4 3.8 - 4.9 g/dL   Globulin, Total 2.5 1.5 - 4.5 g/dL   Albumin/Globulin Ratio 1.8 1.2 - 2.2   Bilirubin Total 0.6 0.0 - 1.2 mg/dL   Alkaline Phosphatase 111 39 - 117 IU/L   AST 28 0 - 40 IU/L   ALT 38 (H) 0 - 32 IU/L  Lipid Panel w/o Chol/HDL Ratio  Result Value Ref Range   Cholesterol, Total 243 (H) 100 - 199 mg/dL   Triglycerides 163 (H) 0 - 149 mg/dL   HDL 46 >39 mg/dL   VLDL Cholesterol Cal 30 5 - 40 mg/dL   LDL Chol Calc (NIH) 167 (H) 0 - 99 mg/dL       Assessment & Plan:   Problem List Items Addressed This Visit      Cardiovascular and Mediastinum   Essential hypertension, benign    Chronic, ongoing with recent elevations.  Suspect some related to anxiety and have recommended she take her Buspar as 5 MG TID vs 2.5 MG.  Continue Metoprolol XL 25 MG daily, HR in 70's at home.  Increase Losartan to 50 MG daily and plan  to follow-up with patient in one week.  If continued elevations noted will increase to 100 MG daily.  Continue to monitor BP at home and return in one week for follow-up and labs.  Is aware of s/s to immediately go to ER for.      Relevant Medications   losartan (COZAAR) 50 MG tablet      I discussed the assessment and treatment plan with the patient. The patient was provided an opportunity to ask questions and all were answered. The patient agreed with the plan and demonstrated an understanding of the instructions.   The patient was advised to call back or seek an in-person evaluation if the symptoms worsen or if the condition fails to improve as anticipated.   I provided 15 minutes of time during this encounter.  Follow up plan: Return in about 1 week (around 06/02/2019) for HTN follow-up in office .

## 2019-05-27 ENCOUNTER — Telehealth: Payer: Self-pay | Admitting: Nurse Practitioner

## 2019-05-27 NOTE — Telephone Encounter (Signed)
Please let patient know to repeat BP after medication.  Sounds like it is trending downwards.  She can take up to 100 MG of Losartan, no more than this.  Currently she has 25 MG tablets, this means she could take 4 of these if needed and continued elevations.  I will follow-up with her in office on 06/03/19.

## 2019-05-27 NOTE — Telephone Encounter (Signed)
Patient notified and verbalized understanding. 

## 2019-05-27 NOTE — Telephone Encounter (Signed)
Pt,stated she did as told with pressure pills and states BP is still high.Pt Stated she had to take an extra pill and was ok, Pt states this morning before medication  BP was 145/100 , did not check after since she just took medication.Pt has an apt on  06/03/2019.

## 2019-06-02 ENCOUNTER — Other Ambulatory Visit: Payer: Self-pay

## 2019-06-02 MED ORDER — BUSPIRONE HCL 5 MG PO TABS: 2.5000 mg | ORAL_TABLET | Freq: Three times a day (TID) | ORAL | 2 refills | Status: DC | PRN

## 2019-06-03 ENCOUNTER — Ambulatory Visit: Payer: Medicare Other | Admitting: Nurse Practitioner

## 2019-06-10 ENCOUNTER — Other Ambulatory Visit: Payer: Self-pay

## 2019-06-10 ENCOUNTER — Ambulatory Visit (INDEPENDENT_AMBULATORY_CARE_PROVIDER_SITE_OTHER): Payer: Medicare Other | Admitting: Nurse Practitioner

## 2019-06-10 ENCOUNTER — Encounter: Payer: Self-pay | Admitting: Nurse Practitioner

## 2019-06-10 VITALS — BP 117/79 | HR 72 | Temp 98.1°F

## 2019-06-10 DIAGNOSIS — I10 Essential (primary) hypertension: Secondary | ICD-10-CM | POA: Diagnosis not present

## 2019-06-10 NOTE — Patient Instructions (Signed)

## 2019-06-10 NOTE — Assessment & Plan Note (Signed)
Chronic, with BP improved at this time.  Continue current medication regimen and monitoring BP at home + collaboration with cardiology.  CMP today.  Return in 3 months.

## 2019-06-10 NOTE — Progress Notes (Signed)
BP 117/79   Pulse 72   Temp 98.1 F (36.7 C) (Oral)   SpO2 97%    Subjective:    Patient ID: Allison Pham, female    DOB: 08/16/1960, 58 y.o.   MRN: [id number]  HPI: Allison Pham is a 58 y.o. female  Chief Complaint  Patient presents with  . Hypertension  . Results    pt states she would like to discuss TSH and heart monitor results   HYPERTENSION Currently taking Losartan 50 MG and Metoprolol XL 25 MG daily.  Had recent elevations in BP with dose changes in Losartan performed.  Has gone to taking Buspar scheduled 2-3 times a day and this has helped with anxiety. TSH in [facility] was 1.881.  Reviewed Dr. Garen Lah' comment on Holter testing to her.  Has been walking daily and exercising.   Hypertension status: stable  Satisfied with current treatment? yes Duration of hypertension: chronic BP monitoring frequency:  daily BP range: 110-120/70-80, with occasional elevations BP medication side effects:  no Medication compliance: good compliance Previous BP meds:Losartan and Metoprolol Aspirin: yes Recurrent headaches: no Visual changes: no Palpitations: no Dyspnea: no Chest pain: no Lower extremity edema: no Dizzy/lightheaded: no  Relevant past medical, surgical, family and social history reviewed and updated as indicated. Interim medical history since our last visit reviewed. Allergies and medications reviewed and updated.  Review of Systems  Constitutional: Negative for activity change, appetite change, diaphoresis, fatigue and fever.  Respiratory: Negative for cough, chest tightness, shortness of breath and wheezing.   Cardiovascular: Negative for chest pain, palpitations and leg swelling.  Gastrointestinal: Negative for abdominal distention, abdominal pain, constipation, diarrhea, nausea and vomiting.  Neurological: Negative for dizziness, syncope, facial asymmetry, speech difficulty, weakness, light-headedness, numbness and headaches.    Psychiatric/Behavioral: Negative.     Per HPI unless specifically indicated above     Objective:    BP 117/79   Pulse 72   Temp 98.1 F (36.7 C) (Oral)   SpO2 97%   Wt Readings from Last 3 Encounters:  05/09/19 171 lb 4 oz (77.7 kg)  04/25/19 170 lb (77.1 kg)  01/20/19 164 lb 12.8 oz (74.8 kg)    Physical Exam Vitals and nursing note reviewed.  Constitutional:      General: She is awake. She is not in acute distress.    Appearance: She is well-developed and overweight. She is not ill-appearing.  HENT:     Head: Normocephalic.     Right Ear: Hearing normal.     Left Ear: Hearing normal.  Eyes:     General: Lids are normal.        Right eye: No discharge.        Left eye: No discharge.     Conjunctiva/sclera: Conjunctivae normal.     Pupils: Pupils are equal, round, and reactive to light.  Neck:     Vascular: No carotid bruit.  Cardiovascular:     Rate and Rhythm: Normal rate and regular rhythm.     Heart sounds: Normal heart sounds. No murmur. No gallop.   Pulmonary:     Effort: Pulmonary effort is normal. No accessory muscle usage or respiratory distress.     Breath sounds: Normal breath sounds.  Abdominal:     General: Bowel sounds are normal.     Palpations: Abdomen is soft.  Musculoskeletal:     Cervical back: Normal range of motion and neck supple.     Right lower leg: No edema.  Left lower leg: No edema.  Skin:    General: Skin is warm and dry.  Neurological:     Mental Status: She is alert and oriented to person, place, and time.  Psychiatric:        Attention and Perception: Attention normal.        Mood and Affect: Mood normal.        Behavior: Behavior normal. Behavior is cooperative.        Thought Content: Thought content normal.        Judgment: Judgment normal.     Results for orders placed or performed in visit on 04/25/19  Thyroid Panel With TSH  Result Value Ref Range   TSH 1.430 0.450 - 4.500 uIU/mL   T4, Total 10.1 4.5 - 12.0  ug/dL   T3 Uptake Ratio 28 24 - 39 %   Free Thyroxine Index 2.8 1.2 - 4.9  VITAMIN D 25 Hydroxy (Vit-D Deficiency, Fractures)  Result Value Ref Range   Vit D, 25-Hydroxy 18.3 (L) 30.0 - 100.0 ng/mL  Comprehensive metabolic panel  Result Value Ref Range   Glucose 79 65 - 99 mg/dL   BUN 17 6 - 24 mg/dL   Creatinine, Ser 0.70 0.57 - 1.00 mg/dL   GFR calc non Af Amer 95 >59 mL/min/1.73   GFR calc Af Amer 110 >59 mL/min/1.73   BUN/Creatinine Ratio 24 (H) 9 - 23   Sodium 141 134 - 144 mmol/L   Potassium 4.2 3.5 - 5.2 mmol/L   Chloride 107 (H) 96 - 106 mmol/L   CO2 18 (L) 20 - 29 mmol/L   Calcium 9.4 8.7 - 10.2 mg/dL   Total Protein 6.9 6.0 - 8.5 g/dL   Albumin 4.4 3.8 - 4.9 g/dL   Globulin, Total 2.5 1.5 - 4.5 g/dL   Albumin/Globulin Ratio 1.8 1.2 - 2.2   Bilirubin Total 0.6 0.0 - 1.2 mg/dL   Alkaline Phosphatase 111 39 - 117 IU/L   AST 28 0 - 40 IU/L   ALT 38 (H) 0 - 32 IU/L  Lipid Panel w/o Chol/HDL Ratio  Result Value Ref Range   Cholesterol, Total 243 (H) 100 - 199 mg/dL   Triglycerides 163 (H) 0 - 149 mg/dL   HDL 46 >39 mg/dL   VLDL Cholesterol Cal 30 5 - 40 mg/dL   LDL Chol Calc (NIH) 167 (H) 0 - 99 mg/dL      Assessment & Plan:   Problem List Items Addressed This Visit      Cardiovascular and Mediastinum   Essential hypertension, benign - Primary    Chronic, with BP improved at this time.  Continue current medication regimen and monitoring BP at home + collaboration with cardiology.  CMP today.  Return in 3 months.      Relevant Orders   Comprehensive metabolic panel       Follow up plan: Return in about 3 months (around 09/08/2019) for HTN/HLD, Vit D.

## 2019-06-11 LAB — COMPREHENSIVE METABOLIC PANEL
ALT: 62 IU/L — ABNORMAL HIGH (ref 0–32)
AST: 35 IU/L (ref 0–40)
Albumin/Globulin Ratio: 1.8 (ref 1.2–2.2)
Albumin: 4.4 g/dL (ref 3.8–4.9)
Alkaline Phosphatase: 123 IU/L — ABNORMAL HIGH (ref 39–117)
BUN/Creatinine Ratio: 20 (ref 9–23)
BUN: 16 mg/dL (ref 6–24)
Bilirubin Total: 0.4 mg/dL (ref 0.0–1.2)
CO2: 22 mmol/L (ref 20–29)
Calcium: 9.4 mg/dL (ref 8.7–10.2)
Chloride: 106 mmol/L (ref 96–106)
Creatinine, Ser: 0.79 mg/dL (ref 0.57–1.00)
GFR calc Af Amer: 95 mL/min/{1.73_m2} (ref >59)
GFR calc non Af Amer: 82 mL/min/{1.73_m2} (ref >59)
Globulin, Total: 2.4 g/dL (ref 1.5–4.5)
Glucose: 84 mg/dL (ref 65–99)
Potassium: 4.3 mmol/L (ref 3.5–5.2)
Sodium: 143 mmol/L (ref 134–144)
Total Protein: 6.8 g/dL (ref 6.0–8.5)

## 2019-06-26 ENCOUNTER — Ambulatory Visit: Payer: Medicare Other | Admitting: Nurse Practitioner

## 2019-07-10 ENCOUNTER — Other Ambulatory Visit: Payer: Self-pay | Admitting: Nurse Practitioner

## 2019-07-11 ENCOUNTER — Other Ambulatory Visit: Payer: Self-pay

## 2019-07-11 MED ORDER — PRAVASTATIN SODIUM 20 MG PO TABS: 20.0000 mg | ORAL_TABLET | Freq: Every day | ORAL | 3 refills | Status: DC

## 2019-07-15 ENCOUNTER — Encounter: Payer: Self-pay | Admitting: *Deleted

## 2019-07-25 ENCOUNTER — Encounter: Payer: Self-pay | Admitting: Nurse Practitioner

## 2019-07-25 ENCOUNTER — Ambulatory Visit (INDEPENDENT_AMBULATORY_CARE_PROVIDER_SITE_OTHER): Payer: Medicare Other | Admitting: Nurse Practitioner

## 2019-07-25 ENCOUNTER — Other Ambulatory Visit: Payer: Self-pay

## 2019-07-25 VITALS — BP 138/84 | HR 69 | Temp 97.9°F | Wt 173.0 lb

## 2019-07-25 DIAGNOSIS — H00012 Hordeolum externum right lower eyelid: Secondary | ICD-10-CM | POA: Diagnosis not present

## 2019-07-25 MED ORDER — SULFAMETHOXAZOLE-TRIMETHOPRIM 800-160 MG PO TABS: 1.0000 | ORAL_TABLET | Freq: Two times a day (BID) | ORAL | 0 refills | Status: AC

## 2019-07-25 MED ORDER — ERYTHROMYCIN 5 MG/GM OP OINT: 1.0000 "application " | TOPICAL_OINTMENT | Freq: Every day | OPHTHALMIC | 0 refills | Status: AC

## 2019-07-25 NOTE — Progress Notes (Signed)
BP 138/84   Pulse 69   Temp 97.9 F (36.6 C) (Oral)   Wt 173 lb (78.5 kg)   SpO2 95%   BMI 33.79 kg/m    Subjective:    Patient ID: Allison Pham, female    DOB: 31-Aug-1960, 58 y.o.   MRN: [id number]  HPI: Allison Pham is a 59 y.o. female  Chief Complaint  Patient presents with  . Conjunctivitis    right eye swelling x about 2 weeks. pt states went to eye doctor, given doxycycline and neomyc-polym-dexameth drops, did not help.    STYE RIGHT EYE Went to eye doctor and was given Doxycycline and Neomycin-Polym-Dexa drops (states the drops burned a lot, but she continued to use them). Seen by Ellin Mayhew, seen on January 14th.  Took both medications for 7 days.  She called the eye doctor today, as was fearful eye was getting worse over past two days, and was told can not be seen until the 4th.  Has been putting warm compresses on eye at home, which is not helping.  Has been using small cap of water and splashing eye to clean it.  Denies pain to eye or vision changes.  Feels it is more swollen and stye remains on outer corner lower lid. Duration:  days Involved eye:  right Onset: gradual Severity: irritated feeling Quality:no pruritus Foreign body sensation:no Visual impairment: no Eye redness: yes Discharge: yes Crusting or matting of eyelids: yes Swelling: yes Photophobia: no Itching: no Tearing: yes Headache: no Floaters: no URI symptoms: no Contact lens use: no Close contacts with similar problems: no Eye trauma: no Aggravating factors: unknown Alleviating factors: nothing Status: worse Treatments attempted: abx treatment  Relevant past medical, surgical, family and social history reviewed and updated as indicated. Interim medical history since our last visit reviewed. Allergies and medications reviewed and updated.  Review of Systems  Constitutional: Negative for activity change, appetite change, diaphoresis, fatigue and fever.  HENT: Negative.   Eyes: Positive  for discharge and redness. Negative for photophobia, pain, itching and visual disturbance.  Respiratory: Negative for cough, chest tightness and shortness of breath.   Cardiovascular: Negative for chest pain, palpitations and leg swelling.  Neurological: Negative.   Psychiatric/Behavioral: Negative.     Per HPI unless specifically indicated above     Objective:    BP 138/84   Pulse 69   Temp 97.9 F (36.6 C) (Oral)   Wt 173 lb (78.5 kg)   SpO2 95%   BMI 33.79 kg/m   Wt Readings from Last 3 Encounters:  07/25/19 173 lb (78.5 kg)  05/09/19 171 lb 4 oz (77.7 kg)  04/25/19 170 lb (77.1 kg)    Physical Exam Vitals and nursing note reviewed.  Constitutional:      General: She is awake. She is not in acute distress.    Appearance: She is well-developed and overweight. She is not ill-appearing.  HENT:     Head: Normocephalic.     Right Ear: Hearing normal.     Left Ear: Hearing normal.  Eyes:     General: Lids are normal. Lids are everted, no foreign bodies appreciated. Vision grossly intact.        Right eye: Hordeolum (exterior corner lower lid) present. No foreign body or discharge.        Left eye: No foreign body, discharge or hordeolum.     Extraocular Movements: Extraocular movements intact.     Conjunctiva/sclera:     Right eye: Right  conjunctiva is injected. No exudate.    Left eye: Left conjunctiva is not injected. No exudate.    Pupils: Pupils are equal, round, and reactive to light.     Visual Fields: Right eye visual fields normal and left eye visual fields normal.     Comments: Right eye with moderate edema upper lid and noted mildly under right eye with mild erythema.  No warmth or tenderness to palpation.    Neck:     Thyroid: No thyromegaly.     Vascular: No carotid bruit or JVD.  Cardiovascular:     Rate and Rhythm: Normal rate and regular rhythm.     Heart sounds: Normal heart sounds. No murmur. No gallop.   Pulmonary:     Effort: Pulmonary effort is  normal.     Breath sounds: Normal breath sounds.  Abdominal:     General: Bowel sounds are normal.     Palpations: Abdomen is soft. There is no hepatomegaly or splenomegaly.  Musculoskeletal:     Cervical back: Normal range of motion and neck supple.     Right lower leg: No edema.     Left lower leg: No edema.  Lymphadenopathy:     Cervical: No cervical adenopathy.  Skin:    General: Skin is warm and dry.  Neurological:     Mental Status: She is alert and oriented to person, place, and time.  Psychiatric:        Attention and Perception: Attention normal.        Mood and Affect: Mood normal.        Behavior: Behavior normal. Behavior is cooperative.        Thought Content: Thought content normal.        Judgment: Judgment normal.     Results for orders placed or performed in visit on 06/10/19  Comprehensive metabolic panel  Result Value Ref Range   Glucose 84 65 - 99 mg/dL   BUN 16 6 - 24 mg/dL   Creatinine, Ser 0.79 0.57 - 1.00 mg/dL   GFR calc non Af Amer 82 >59 mL/min/1.73   GFR calc Af Amer 95 >59 mL/min/1.73   BUN/Creatinine Ratio 20 9 - 23   Sodium 143 134 - 144 mmol/L   Potassium 4.3 3.5 - 5.2 mmol/L   Chloride 106 96 - 106 mmol/L   CO2 22 20 - 29 mmol/L   Calcium 9.4 8.7 - 10.2 mg/dL   Total Protein 6.8 6.0 - 8.5 g/dL   Albumin 4.4 3.8 - 4.9 g/dL   Globulin, Total 2.4 1.5 - 4.5 g/dL   Albumin/Globulin Ratio 1.8 1.2 - 2.2   Bilirubin Total 0.4 0.0 - 1.2 mg/dL   Alkaline Phosphatase 123 (H) 39 - 117 IU/L   AST 35 0 - 40 IU/L   ALT 62 (H) 0 - 32 IU/L      Assessment & Plan:   Problem List Items Addressed This Visit      Other   Hordeolum externum of right lower eyelid - Primary    Acute with no improvement, patient feels worsening, and moderate edema around exterior eye.  Vision is intact with no difficulty on exam.  Urgent referral to Dr. Ellin Mayhew to see if patient can be seen sooner.  Will start Bactrim DS and Erythromycin ointment due to edema noted around  exterior eye and ongoing presence of stye.  Continue warm compresses at home, but no water splashing in eye.  If worsening over weekend pt given strict instruction  to go to nearest UC or ER.        Relevant Orders   Ambulatory referral to Ophthalmology       Follow up plan: Return in about 1 week (around 08/01/2019) for Stye.

## 2019-07-25 NOTE — Assessment & Plan Note (Addendum)
Acute with no improvement, patient feels worsening, and moderate edema around exterior eye.  Vision is intact with no difficulty on exam.  Urgent referral to Dr. Ellin Mayhew to see if patient can be seen sooner.  Will start Bactrim DS and Erythromycin ointment due to edema noted around exterior eye and ongoing presence of stye.  Continue warm compresses at home, but no water splashing in eye.  If worsening over weekend pt given strict instruction to go to nearest UC or ER.

## 2019-07-25 NOTE — Patient Instructions (Signed)

## 2019-09-09 ENCOUNTER — Other Ambulatory Visit: Payer: Self-pay

## 2019-09-09 ENCOUNTER — Ambulatory Visit (INDEPENDENT_AMBULATORY_CARE_PROVIDER_SITE_OTHER): Payer: Medicare Other | Admitting: Nurse Practitioner

## 2019-09-09 ENCOUNTER — Encounter: Payer: Self-pay | Admitting: Nurse Practitioner

## 2019-09-09 VITALS — BP 126/86 | HR 87 | Temp 98.7°F

## 2019-09-09 DIAGNOSIS — R748 Abnormal levels of other serum enzymes: Secondary | ICD-10-CM | POA: Diagnosis not present

## 2019-09-09 DIAGNOSIS — I10 Essential (primary) hypertension: Secondary | ICD-10-CM | POA: Diagnosis not present

## 2019-09-09 DIAGNOSIS — E559 Vitamin D deficiency, unspecified: Secondary | ICD-10-CM | POA: Diagnosis not present

## 2019-09-09 DIAGNOSIS — E785 Hyperlipidemia, unspecified: Secondary | ICD-10-CM

## 2019-09-09 NOTE — Assessment & Plan Note (Signed)
Continue supplement and check Vit D level today.

## 2019-09-09 NOTE — Assessment & Plan Note (Signed)
Check CMP and GGT today. 

## 2019-09-09 NOTE — Assessment & Plan Note (Signed)
Chronic, with BP at goal on office visit today, occasional elevations at home.  Continue current medication regimen and monitoring BP at home + collaboration with cardiology.  CMP today.  Return in 4 months for annual physical.

## 2019-09-09 NOTE — Progress Notes (Signed)
BP 126/86   Pulse 87   Temp 98.7 F (37.1 C) (Oral)   SpO2 97%    Subjective:    Patient ID: Allison Pham, female    DOB: [birth year], 59 y.o.   MRN: [id number]  HPI: Allison Pham is a 60 y.o. female  Chief Complaint  Patient presents with  . Hyperlipidemia  . Hypertension   HYPERTENSION / HYPERLIPIDEMIA Continues on Metoprolol XL 25 MG daily, Pravastatin 20 MG daily, and Losartan 50 MG daily, ASA.  Saw cardiology last on 05/09/2019. Satisfied with current treatment? yes Duration of hypertension: chronic BP monitoring frequency: daily BP range: 130-140/80 -- occasional elevations BP medication side effects: no Duration of hyperlipidemia: chronic Cholesterol medication side effects: no Cholesterol supplements: none Medication compliance: good compliance Aspirin: yes Recent stressors: no Recurrent headaches: no Visual changes: no Palpitations: no Dyspnea: no Chest pain: no Lower extremity edema: no Dizzy/lightheaded: no   VITAMIN D DEFICIENCY: Continues on Vitamin D every day.  No recent falls or fractures.  No muscle aches.    Relevant past medical, surgical, family and social history reviewed and updated as indicated. Interim medical history since our last visit reviewed. Allergies and medications reviewed and updated.  Review of Systems  Constitutional: Negative for activity change, appetite change, diaphoresis, fatigue and fever.  Respiratory: Negative for cough, chest tightness and shortness of breath.   Cardiovascular: Negative for chest pain, palpitations and leg swelling.  Gastrointestinal: Negative.   Neurological: Negative.   Psychiatric/Behavioral: Negative.     Per HPI unless specifically indicated above     Objective:    BP 126/86   Pulse 87   Temp 98.7 F (37.1 C) (Oral)   SpO2 97%   Wt Readings from Last 3 Encounters:  07/25/19 173 lb (78.5 kg)  05/09/19 171 lb 4 oz (77.7 kg)  04/25/19 170 lb (77.1 kg)    Physical Exam Vitals and  nursing note reviewed.  Constitutional:      General: She is awake. She is not in acute distress.    Appearance: She is well-developed and overweight. She is not ill-appearing.  HENT:     Head: Normocephalic.     Right Ear: Hearing normal.     Left Ear: Hearing normal.  Eyes:     General: Lids are normal.        Right eye: No discharge.        Left eye: No discharge.     Conjunctiva/sclera: Conjunctivae normal.     Pupils: Pupils are equal, round, and reactive to light.  Neck:     Vascular: No carotid bruit.  Cardiovascular:     Rate and Rhythm: Normal rate and regular rhythm.     Heart sounds: Normal heart sounds. No murmur. No gallop.   Pulmonary:     Effort: Pulmonary effort is normal. No accessory muscle usage or respiratory distress.     Breath sounds: Normal breath sounds.  Abdominal:     General: Bowel sounds are normal.     Palpations: Abdomen is soft.  Musculoskeletal:     Cervical back: Normal range of motion and neck supple.     Right lower leg: No edema.     Left lower leg: No edema.  Skin:    General: Skin is warm and dry.  Neurological:     Mental Status: She is alert and oriented to person, place, and time.  Psychiatric:        Attention and Perception: Attention normal.  Mood and Affect: Mood normal.        Behavior: Behavior normal. Behavior is cooperative.        Thought Content: Thought content normal.        Judgment: Judgment normal.     Results for orders placed or performed in visit on 06/10/19  Comprehensive metabolic panel  Result Value Ref Range   Glucose 84 65 - 99 mg/dL   BUN 16 6 - 24 mg/dL   Creatinine, Ser 0.79 0.57 - 1.00 mg/dL   GFR calc non Af Amer 82 >59 mL/min/1.73   GFR calc Af Amer 95 >59 mL/min/1.73   BUN/Creatinine Ratio 20 9 - 23   Sodium 143 134 - 144 mmol/L   Potassium 4.3 3.5 - 5.2 mmol/L   Chloride 106 96 - 106 mmol/L   CO2 22 20 - 29 mmol/L   Calcium 9.4 8.7 - 10.2 mg/dL   Total Protein 6.8 6.0 - 8.5 g/dL    Albumin 4.4 3.8 - 4.9 g/dL   Globulin, Total 2.4 1.5 - 4.5 g/dL   Albumin/Globulin Ratio 1.8 1.2 - 2.2   Bilirubin Total 0.4 0.0 - 1.2 mg/dL   Alkaline Phosphatase 123 (H) 39 - 117 IU/L   AST 35 0 - 40 IU/L   ALT 62 (H) 0 - 32 IU/L      Assessment & Plan:   Problem List Items Addressed This Visit      Cardiovascular and Mediastinum   Essential hypertension, benign - Primary    Chronic, with BP at goal on office visit today, occasional elevations at home.  Continue current medication regimen and monitoring BP at home + collaboration with cardiology.  CMP today.  Return in 4 months for annual physical.      Relevant Orders   Comprehensive metabolic panel     Other   Dyslipidemia    Chronic, ongoing.  Continue current medication regimen and adjust as needed, monitor LFT as has history of elevation.  Pravastatin being tolerated well with no major increased in LFT noted.  Check CMP and lipid panel today.      Relevant Orders   Lipid Panel w/o Chol/HDL Ratio   Elevated serum GGT level    Check CMP and GGT today.      Relevant Orders   Gamma GT   Vitamin D deficiency    Continue supplement and check Vit D level today.          Follow up plan: Return in about 4 months (around 01/09/2020) for Annual physical.

## 2019-09-09 NOTE — Assessment & Plan Note (Signed)
Chronic, ongoing.  Continue current medication regimen and adjust as needed, monitor LFT as has history of elevation.  Pravastatin being tolerated well with no major increased in LFT noted.  Check CMP and lipid panel today. 

## 2019-09-09 NOTE — Patient Instructions (Signed)
DASH Eating Plan DASH stands for "Dietary Approaches to Stop Hypertension." The DASH eating plan is a healthy eating plan that has been shown to reduce high blood pressure (hypertension). It may also reduce your risk for type 2 diabetes, heart disease, and stroke. The DASH eating plan may also help with weight loss. What are tips for following this plan?  General guidelines  Avoid eating more than 2,300 mg (milligrams) of salt (sodium) a day. If you have hypertension, you may need to reduce your sodium intake to 1,500 mg a day.  Limit alcohol intake to no more than 1 drink a day for nonpregnant women and 2 drinks a day for men. One drink equals 12 oz of beer, 5 oz of wine, or 1 oz of hard liquor.  Work with your health care provider to maintain a healthy body weight or to lose weight. Ask what an ideal weight is for you.  Get at least 30 minutes of exercise that causes your heart to beat faster (aerobic exercise) most days of the week. Activities may include walking, swimming, or biking.  Work with your health care provider or diet and nutrition specialist (dietitian) to adjust your eating plan to your individual calorie needs. Reading food labels   Check food labels for the amount of sodium per serving. Choose foods with less than 5 percent of the Daily Value of sodium. Generally, foods with less than 300 mg of sodium per serving fit into this eating plan.  To find whole grains, look for the word "whole" as the first word in the ingredient list. Shopping  Buy products labeled as "low-sodium" or "no salt added."  Buy fresh foods. Avoid canned foods and premade or frozen meals. Cooking  Avoid adding salt when cooking. Use salt-free seasonings or herbs instead of table salt or sea salt. Check with your health care provider or pharmacist before using salt substitutes.  Do not fry foods. Cook foods using healthy methods such as baking, boiling, grilling, and broiling instead.  Cook with  heart-healthy oils, such as olive, canola, soybean, or sunflower oil. Meal planning  Eat a balanced diet that includes: ? 5 or more servings of fruits and vegetables each day. At each meal, try to fill half of your plate with fruits and vegetables. ? Up to 6-8 servings of whole grains each day. ? Less than 6 oz of lean meat, poultry, or fish each day. A 3-oz serving of meat is about the same size as a deck of cards. One egg equals 1 oz. ? 2 servings of low-fat dairy each day. ? A serving of nuts, seeds, or beans 5 times each week. ? Heart-healthy fats. Healthy fats called Omega-3 fatty acids are found in foods such as flaxseeds and coldwater fish, like sardines, salmon, and mackerel.  Limit how much you eat of the following: ? Canned or prepackaged foods. ? Food that is high in trans fat, such as fried foods. ? Food that is high in saturated fat, such as fatty meat. ? Sweets, desserts, sugary drinks, and other foods with added sugar. ? Full-fat dairy products.  Do not salt foods before eating.  Try to eat at least 2 vegetarian meals each week.  Eat more home-cooked food and less restaurant, buffet, and fast food.  When eating at a restaurant, ask that your food be prepared with less salt or no salt, if possible. What foods are recommended? The items listed may not be a complete list. Talk with your dietitian about   what dietary choices are best for you. Grains Whole-grain or whole-wheat bread. Whole-grain or whole-wheat pasta. Brown rice. Oatmeal. Quinoa. Bulgur. Whole-grain and low-sodium cereals. Pita bread. Low-fat, low-sodium crackers. Whole-wheat flour tortillas. Vegetables Fresh or frozen vegetables (raw, steamed, roasted, or grilled). Low-sodium or reduced-sodium tomato and vegetable juice. Low-sodium or reduced-sodium tomato sauce and tomato paste. Low-sodium or reduced-sodium canned vegetables. Fruits All fresh, dried, or frozen fruit. Canned fruit in natural juice (without  added sugar). Meat and other protein foods Skinless chicken or turkey. Ground chicken or turkey. Pork with fat trimmed off. Fish and seafood. Egg whites. Dried beans, peas, or lentils. Unsalted nuts, nut butters, and seeds. Unsalted canned beans. Lean cuts of beef with fat trimmed off. Low-sodium, lean deli meat. Dairy Low-fat (1%) or fat-free (skim) milk. Fat-free, low-fat, or reduced-fat cheeses. Nonfat, low-sodium ricotta or cottage cheese. Low-fat or nonfat yogurt. Low-fat, low-sodium cheese. Fats and oils Soft margarine without trans fats. Vegetable oil. Low-fat, reduced-fat, or light mayonnaise and salad dressings (reduced-sodium). Canola, safflower, olive, soybean, and sunflower oils. Avocado. Seasoning and other foods Herbs. Spices. Seasoning mixes without salt. Unsalted popcorn and pretzels. Fat-free sweets. What foods are not recommended? The items listed may not be a complete list. Talk with your dietitian about what dietary choices are best for you. Grains Baked goods made with fat, such as croissants, muffins, or some breads. Dry pasta or rice meal packs. Vegetables Creamed or fried vegetables. Vegetables in a cheese sauce. Regular canned vegetables (not low-sodium or reduced-sodium). Regular canned tomato sauce and paste (not low-sodium or reduced-sodium). Regular tomato and vegetable juice (not low-sodium or reduced-sodium). Pickles. Olives. Fruits Canned fruit in a light or heavy syrup. Fried fruit. Fruit in cream or butter sauce. Meat and other protein foods Fatty cuts of meat. Ribs. Fried meat. Bacon. Sausage. Bologna and other processed lunch meats. Salami. Fatback. Hotdogs. Bratwurst. Salted nuts and seeds. Canned beans with added salt. Canned or smoked fish. Whole eggs or egg yolks. Chicken or turkey with skin. Dairy Whole or 2% milk, cream, and half-and-half. Whole or full-fat cream cheese. Whole-fat or sweetened yogurt. Full-fat cheese. Nondairy creamers. Whipped toppings.  Processed cheese and cheese spreads. Fats and oils Butter. Stick margarine. Lard. Shortening. Ghee. Bacon fat. Tropical oils, such as coconut, palm kernel, or palm oil. Seasoning and other foods Salted popcorn and pretzels. Onion salt, garlic salt, seasoned salt, table salt, and sea salt. Worcestershire sauce. Tartar sauce. Barbecue sauce. Teriyaki sauce. Soy sauce, including reduced-sodium. Steak sauce. Canned and packaged gravies. Fish sauce. Oyster sauce. Cocktail sauce. Horseradish that you find on the shelf. Ketchup. Mustard. Meat flavorings and tenderizers. Bouillon cubes. Hot sauce and Tabasco sauce. Premade or packaged marinades. Premade or packaged taco seasonings. Relishes. Regular salad dressings. Where to find more information:  National Heart, Lung, and Blood Institute: www.nhlbi.nih.gov  American Heart Association: www.heart.org Summary  The DASH eating plan is a healthy eating plan that has been shown to reduce high blood pressure (hypertension). It may also reduce your risk for type 2 diabetes, heart disease, and stroke.  With the DASH eating plan, you should limit salt (sodium) intake to 2,300 mg a day. If you have hypertension, you may need to reduce your sodium intake to 1,500 mg a day.  When on the DASH eating plan, aim to eat more fresh fruits and vegetables, whole grains, lean proteins, low-fat dairy, and heart-healthy fats.  Work with your health care provider or diet and nutrition specialist (dietitian) to adjust your eating plan to your   individual calorie needs. This information is not intended to replace advice given to you by your health care provider. Make sure you discuss any questions you have with your health care provider. Document Revised: 05/25/2017 Document Reviewed: 06/05/2016 Elsevier Patient Education  2020 [employer]  

## 2019-09-10 LAB — LIPID PANEL W/O CHOL/HDL RATIO
Cholesterol, Total: 241 mg/dL — ABNORMAL HIGH (ref 100–199)
HDL: 37 mg/dL — ABNORMAL LOW (ref >39)
LDL Chol Calc (NIH): 134 mg/dL — ABNORMAL HIGH (ref 0–99)
Triglycerides: 384 mg/dL — ABNORMAL HIGH (ref 0–149)
VLDL Cholesterol Cal: 70 mg/dL — ABNORMAL HIGH (ref 5–40)

## 2019-09-10 LAB — COMPREHENSIVE METABOLIC PANEL
ALT: 39 IU/L — ABNORMAL HIGH (ref 0–32)
AST: 23 IU/L (ref 0–40)
Albumin/Globulin Ratio: 1.5 (ref 1.2–2.2)
Albumin: 4.1 g/dL (ref 3.8–4.9)
Alkaline Phosphatase: 111 IU/L (ref 39–117)
BUN/Creatinine Ratio: 17 (ref 9–23)
BUN: 13 mg/dL (ref 6–24)
Bilirubin Total: 0.4 mg/dL (ref 0.0–1.2)
CO2: 21 mmol/L (ref 20–29)
Calcium: 9.5 mg/dL (ref 8.7–10.2)
Chloride: 106 mmol/L (ref 96–106)
Creatinine, Ser: 0.75 mg/dL (ref 0.57–1.00)
GFR calc Af Amer: 101 mL/min/{1.73_m2} (ref >59)
GFR calc non Af Amer: 88 mL/min/{1.73_m2} (ref >59)
Globulin, Total: 2.8 g/dL (ref 1.5–4.5)
Glucose: 87 mg/dL (ref 65–99)
Potassium: 4 mmol/L (ref 3.5–5.2)
Sodium: 141 mmol/L (ref 134–144)
Total Protein: 6.9 g/dL (ref 6.0–8.5)

## 2019-09-10 LAB — GAMMA GT: GGT: 214 IU/L — ABNORMAL HIGH (ref 0–60)

## 2019-09-10 NOTE — Progress Notes (Signed)
Contacted via MyChart

## 2019-10-03 ENCOUNTER — Other Ambulatory Visit: Payer: Self-pay | Admitting: Nurse Practitioner

## 2019-12-08 ENCOUNTER — Other Ambulatory Visit: Payer: Self-pay | Admitting: Family Medicine

## 2019-12-08 DIAGNOSIS — H6981 Other specified disorders of Eustachian tube, right ear: Secondary | ICD-10-CM

## 2019-12-08 NOTE — Telephone Encounter (Signed)
Forwarded to wrong office- apologies

## 2019-12-08 NOTE — Telephone Encounter (Signed)
Routing to provider  

## 2019-12-08 NOTE — Telephone Encounter (Signed)
Requested medication (s) are due for refill today -expired Rx  Requested medication (s) are on the active medication list - yes  Future visit scheduled -no  Last refill: 10/17/19 11 RF  Notes to clinic: Rx RF request- Rx'd by provider no longed with practice  Requested Prescriptions  Pending Prescriptions Disp Refills   fluticasone (FLONASE) 50 MCG/ACT nasal spray [Pharmacy Med Name: FLUTICASONE PROPIONATE NS] 16 g 0    Sig: Place 1 spray into both nostrils 2 (two) times daily as needed.      Ear, Nose, and Throat: Nasal Preparations - Corticosteroids Passed - 12/08/2019  3:24 PM      Passed - Valid encounter within last 12 months    Recent Outpatient Visits           3 months ago Essential hypertension, benign   [place], Henrine Screws T, NP   4 months ago Hordeolum externum of right lower eyelid   [place], [place] T, NP   6 months ago Essential hypertension, benign   [place], [place] T, NP   6 months ago Essential hypertension, benign   [place], [place] T, NP   7 months ago Essential hypertension, benign   [place], Barbaraann Faster, NP       Future Appointments             In 1 month [place], [place] T, NP [facility]                Requested Prescriptions  Pending Prescriptions Disp Refills   fluticasone (FLONASE) 50 MCG/ACT nasal spray [Pharmacy Med Name: FLUTICASONE PROPIONATE NS] 16 g 0    Sig: Place 1 spray into both nostrils 2 (two) times daily as needed.      Ear, Nose, and Throat: Nasal Preparations - Corticosteroids Passed - 12/08/2019  3:24 PM      Passed - Valid encounter within last 12 months    Recent Outpatient Visits           3 months ago Essential hypertension, benign   [place], Henrine Screws T, NP   4 months ago Hordeolum externum of right lower eyelid   [place], [place] T, NP    6 months ago Essential hypertension, benign   [place], [place] T, NP   6 months ago Essential hypertension, benign   [place], [place] T, NP   7 months ago Essential hypertension, benign   [place], Barbaraann Faster, NP       Future Appointments             In 1 month Cannady, Barbaraann Faster, NP [facility]

## 2019-12-08 NOTE — Telephone Encounter (Signed)
Not our pt

## 2020-01-02 ENCOUNTER — Encounter: Payer: Self-pay | Admitting: Nurse Practitioner

## 2020-01-09 ENCOUNTER — Other Ambulatory Visit: Payer: Self-pay

## 2020-01-09 ENCOUNTER — Ambulatory Visit (INDEPENDENT_AMBULATORY_CARE_PROVIDER_SITE_OTHER): Payer: Medicare Other | Admitting: Nurse Practitioner

## 2020-01-09 ENCOUNTER — Other Ambulatory Visit (HOSPITAL_COMMUNITY)
Admission: RE | Admit: 2020-01-09 | Discharge: 2020-01-09 | Disposition: A | Discharge status: Home / Self Care | Payer: Medicare Other | Source: Ambulatory Visit | Attending: Nurse Practitioner | Admitting: Nurse Practitioner

## 2020-01-09 ENCOUNTER — Encounter: Payer: Self-pay | Admitting: Nurse Practitioner

## 2020-01-09 VITALS — BP 128/78 | HR 77 | Temp 98.3°F | Ht 59.9 in | Wt 173.0 lb

## 2020-01-09 DIAGNOSIS — R748 Abnormal levels of other serum enzymes: Secondary | ICD-10-CM | POA: Diagnosis not present

## 2020-01-09 DIAGNOSIS — J309 Allergic rhinitis, unspecified: Secondary | ICD-10-CM

## 2020-01-09 DIAGNOSIS — I83812 Varicose veins of left lower extremities with pain: Secondary | ICD-10-CM

## 2020-01-09 DIAGNOSIS — Z1231 Encounter for screening mammogram for malignant neoplasm of breast: Secondary | ICD-10-CM | POA: Diagnosis not present

## 2020-01-09 DIAGNOSIS — Z1151 Encounter for screening for human papillomavirus (HPV): Secondary | ICD-10-CM | POA: Insufficient documentation

## 2020-01-09 DIAGNOSIS — F411 Generalized anxiety disorder: Secondary | ICD-10-CM

## 2020-01-09 DIAGNOSIS — Z1211 Encounter for screening for malignant neoplasm of colon: Secondary | ICD-10-CM | POA: Diagnosis not present

## 2020-01-09 DIAGNOSIS — E559 Vitamin D deficiency, unspecified: Secondary | ICD-10-CM

## 2020-01-09 DIAGNOSIS — J432 Centrilobular emphysema: Secondary | ICD-10-CM | POA: Diagnosis not present

## 2020-01-09 DIAGNOSIS — I10 Essential (primary) hypertension: Secondary | ICD-10-CM | POA: Diagnosis not present

## 2020-01-09 DIAGNOSIS — Z0000 Encounter for general adult medical examination without abnormal findings: Secondary | ICD-10-CM | POA: Diagnosis not present

## 2020-01-09 DIAGNOSIS — Z20822 Contact with and (suspected) exposure to covid-19: Secondary | ICD-10-CM | POA: Diagnosis not present

## 2020-01-09 DIAGNOSIS — E785 Hyperlipidemia, unspecified: Secondary | ICD-10-CM

## 2020-01-09 DIAGNOSIS — Z124 Encounter for screening for malignant neoplasm of cervix: Secondary | ICD-10-CM

## 2020-01-09 DIAGNOSIS — B370 Candidal stomatitis: Secondary | ICD-10-CM

## 2020-01-09 DIAGNOSIS — E669 Obesity, unspecified: Secondary | ICD-10-CM

## 2020-01-09 DIAGNOSIS — R21 Rash and other nonspecific skin eruption: Secondary | ICD-10-CM

## 2020-01-09 DIAGNOSIS — L259 Unspecified contact dermatitis, unspecified cause: Secondary | ICD-10-CM | POA: Insufficient documentation

## 2020-01-09 DIAGNOSIS — Z87891 Personal history of nicotine dependence: Secondary | ICD-10-CM

## 2020-01-09 MED ORDER — SHINGRIX 50 MCG/0.5ML IM SUSR: 0.5000 mL | Freq: Once | INTRAMUSCULAR | 0 refills | Status: AC

## 2020-01-09 MED ORDER — TRAZODONE HCL 50 MG PO TABS: 25.0000 mg | ORAL_TABLET | Freq: Every day | ORAL | 1 refills | Status: DC

## 2020-01-09 MED ORDER — NYSTATIN 100000 UNIT/GM EX CREA
1.0000 | TOPICAL_CREAM | Freq: Two times a day (BID) | CUTANEOUS | 1 refills | Status: DC
Start: 2020-01-09 — End: 2020-02-16

## 2020-01-09 MED ORDER — BUSPIRONE HCL 5 MG PO TABS: 2.5000 mg | ORAL_TABLET | Freq: Three times a day (TID) | ORAL | 4 refills | Status: DC | PRN

## 2020-01-09 MED ORDER — LOSARTAN POTASSIUM 100 MG PO TABS
100.0000 mg | ORAL_TABLET | Freq: Every day | ORAL | 4 refills | Status: DC
Start: 2020-01-09 — End: 2020-07-16

## 2020-01-09 MED ORDER — NYSTATIN 100000 UNIT/ML MT SUSP
5.0000 mL | Freq: Three times a day (TID) | OROMUCOSAL | 0 refills | Status: AC
Start: 2020-01-09 — End: 2020-01-16

## 2020-01-09 NOTE — Assessment & Plan Note (Signed)
Chronic, stable.  Continue current medication regimen and adjust as needed. ?

## 2020-01-09 NOTE — Assessment & Plan Note (Signed)
BMI 33.90.  Recommended eating smaller high protein, low fat meals more frequently and exercising 30 mins a day 5 times a week with a goal of 10-15lb weight loss in the next 3 months. Patient voiced their understanding and motivation to adhere to these recommendations.

## 2020-01-09 NOTE — Progress Notes (Addendum)
BP 128/78 (BP Location: Left Arm)   Pulse 77   Temp 98.3 F (36.8 C) (Oral)   Ht 4' 11.9" (1.521 m)   Wt 173 lb (78.5 kg)   SpO2 97%   BMI 33.90 kg/m    Subjective:    Patient ID: Allison Pham, female    DOB: [birth year], 60 y.o.   MRN: [id number]  HPI: Allison Pham is a 59 y.o. female presenting on 01/09/2020 for comprehensive medical examination. Current medical complaints include:none  She currently lives with: husband Menopausal Symptoms: no   HYPERTENSION / HYPERLIPIDEMIA Continues on Metoprolol XL 25 MG daily, Pravastatin 20 MG daily, and Losartan 50 MG daily, ASA.  Saw cardiology last on 05/09/2019.  Has history of elevation in LFTs which is improving.  Last ALT/AST 39/23 and GGT 214.  History of smoking starting at age 36, 1 PPD, quit 4 years ago. Satisfied with current treatment? yes Duration of hypertension: chronic BP monitoring frequency: daily BP range: 130-140/80 -- occasional elevations BP medication side effects: no Duration of hyperlipidemia: chronic Cholesterol medication side effects: no Cholesterol supplements: none Medication compliance: good compliance Aspirin: yes Recent stressors: no Recurrent headaches: no Visual changes: no Palpitations: no Dyspnea: no Chest pain: no Lower extremity edema: no Dizzy/lightheaded: no   COPD Centrilobular mild noted on lung CT 2019.  She is a past smoker.  No current symptoms or inhalers.  Did not obtain yearly CT last year, recommend she obtain and provided pamphlet with number to scheduled. COPD status: stable Satisfied with current treatment?: yes Oxygen use: no Dyspnea frequency:  none Cough frequency: none Limitation of activity: no Productive cough: none Last Spirometry: unknonw Pneumovax: refuses Influenza: refuses  VITAMIN D DEFICIENCY: Continues on Vitamin D every day.  No recent falls or fractures.  No muscle aches.    RASH Reports a rash to under breasts started about one week ago.     Reports some irritation to tongue as well. Duration:  days  Location: under breasts  Itching: yes Burning: no Redness: yes Oozing: no Scaling: no Blisters: no Painful: yes Fevers: no Change in detergents/soaps/personal care products: no Recent illness: no Recent travel:no History of same: no Context: fluctuating Alleviating factors: none Treatments attempted: poison ivy cream Shortness of breath: no  Throat/tongue swelling: no Myalgias/arthralgias: no  ANXIETY Continues on Buspar 2.5 to 5 MG TID, Trazodone for sleep, she reports these medications help a lot.   Mood status: stable Satisfied with current treatment?: yes Symptom severity: mild  Duration of current treatment : chronic Side effects: no Medication compliance: good compliance Psychotherapy/counseling: none Depressed mood: no Anxious mood: no Anhedonia: no Significant weight loss or gain: no Insomnia: improved with Trazodone Fatigue: no Feelings of worthlessness or guilt: no Impaired concentration/indecisiveness: no Suicidal ideations: no Hopelessness: no Crying spells: no Depression screen [facility] 2/9 01/09/2020 04/25/2019 02/28/2019 01/20/2019 07/02/2018  Decreased Interest 2 1 1 3 2   Down, Depressed, Hopeless 2 1 1  0 2  PHQ - 2 Score 4 2 2 3 4   Altered sleeping 1 1 1 3 2   Tired, decreased energy 1 1 1 1 2   Change in appetite 1 0 0 0 0  Feeling bad or failure about yourself  1 1 0 0 1  Trouble concentrating 0 0 0 1 0  Moving slowly or fidgety/restless 0 0 0 0 0  Suicidal thoughts 0 0 0 0 0  PHQ-9 Score 8 5 4 8 9   Difficult doing work/chores Not difficult at all Not  difficult at all Not difficult at all Somewhat difficult Somewhat difficult  Some recent data might be hidden    The patient does not have a history of falls. I did not complete a risk assessment for falls. A plan of care for falls was not documented.   Past Medical History:  Past Medical History:  Diagnosis Date  . Anxiety   . Carotid  atherosclerosis, bilateral 07/16/2018   rescan Jan 2021  . Emphysema of lung ([place]) 03/21/2018  . GERD (gastroesophageal reflux disease)   . Hypercholesteremia   . Hypertension   . Tick bite   . Vertigo   . Wears dentures    partial upper    Surgical History:  Past Surgical History:  Procedure Laterality Date  . ABDOMINAL HYSTERECTOMY     Partial  . COLONOSCOPY WITH PROPOFOL N/A 05/10/2017   Procedure: COLONOSCOPY WITH PROPOFOL;  Surgeon: [name], MD;  Location: [place];  Service: Endoscopy;  Laterality: N/A;  . HEMORRHOID SURGERY    . POLYPECTOMY N/A 05/10/2017   Procedure: POLYPECTOMY;  Surgeon: [name], MD;  Location: [place];  Service: Endoscopy;  Laterality: N/A;    Medications:  Current Outpatient Medications on File Prior to Visit  Medication Sig  . aspirin 81 MG chewable tablet Chew 81 mg by mouth as needed.   . cholecalciferol (VITAMIN D) 1000 units tablet Take 1,000 Units by mouth daily.  . cyclobenzaprine (FLEXERIL) 5 MG tablet Take 1 tablet (5 mg total) by mouth at bedtime as needed for muscle spasms. Do not drive while taking as can cause drowsiness.  . fluticasone (FLONASE) 50 MCG/ACT nasal spray Place 1 spray into both nostrils 2 (two) times daily as needed.  . gabapentin (NEURONTIN) 300 MG capsule Take 1 capsule (300 mg total) by mouth at bedtime.  Marland Kitchen loratadine (CLARITIN) 10 MG tablet Take 1 tablet (10 mg total) by mouth daily as needed for allergies.  . metoprolol succinate (TOPROL-XL) 25 MG 24 hr tablet Take 1 tablet (25 mg total) by mouth daily.  . pravastatin (PRAVACHOL) 20 MG tablet Take 1 tablet (20 mg total) by mouth daily. For cholesterol; this replaces atorvastatin   No current facility-administered medications on file prior to visit.    Allergies:  Allergies  Allergen Reactions  . Penicillins Rash    Social History:  Social History   Socioeconomic History  . Marital status: Single    Spouse name: Not on file    . Number of children: Not on file  . Years of education: Not on file  . Highest education level: Not on file  Occupational History  . Not on file  Tobacco Use  . Smoking status: Former Smoker    Packs/day: 1.00    Years: 38.00    Pack years: 38.00    Types: Cigarettes    Quit date: 09/13/2015    Years since quitting: 4.3  . Smokeless tobacco: Never Used  Vaping Use  . Vaping Use: Never used  Substance and Sexual Activity  . Alcohol use: Never    Comment: rare - holidays  . Drug use: No  . Sexual activity: Not Currently    Birth control/protection: Surgical  Other Topics Concern  . Not on file  Social History Narrative  . Not on file   Social Determinants of Health   Financial Resource Strain:   . Difficulty of Paying Living Expenses:   Food Insecurity:   . Worried About Charity fundraiser in the Last Year:   .  Ran Out of Food in the Last Year:   Transportation Needs:   . Film/video editor (Medical):   Marland Kitchen Lack of Transportation (Non-Medical):   Physical Activity:   . Days of Exercise per Week:   . Minutes of Exercise per Session:   Stress:   . Feeling of Stress :   Social Connections:   . Frequency of Communication with Friends and Family:   . Frequency of Social Gatherings with Friends and Family:   . Attends Religious Services:   . Active Member of Clubs or Organizations:   . Attends Archivist Meetings:   Marland Kitchen Marital Status:   Intimate Partner Violence:   . Fear of Current or Ex-Partner:   . Emotionally Abused:   Marland Kitchen Physically Abused:   . Sexually Abused:    Social History   Tobacco Use  Smoking Status Former Smoker  . Packs/day: 1.00  . Years: 38.00  . Pack years: 38.00  . Types: Cigarettes  . Quit date: 09/13/2015  . Years since quitting: 4.3  Smokeless Tobacco Never Used   Social History   Substance and Sexual Activity  Alcohol Use Never   Comment: rare - holidays    Family History:  Family History  Problem Relation Age of  Onset  . Cancer Mother        Colon  . Hypertension Mother   . Hypertension Father   . Heart attack Father   . Heart disease Father   . Hypertension Sister   . COPD Sister   . Hypertension Brother   . Heart disease Brother   . Cancer Maternal Aunt   . Breast cancer Maternal Aunt   . Cancer Maternal Uncle   . Breast cancer Maternal Aunt   . Diabetes Neg Hx   . Stroke Neg Hx     Past medical history, surgical history, medications, allergies, family history and social history reviewed with patient today and changes made to appropriate areas of the chart.   Review of Systems - skin rash and loss of taste All other ROS negative except what is listed above and in the HPI.      Objective:    BP 128/78 (BP Location: Left Arm)   Pulse 77   Temp 98.3 F (36.8 C) (Oral)   Ht 4' 11.9" (1.521 m)   Wt 173 lb (78.5 kg)   SpO2 97%   BMI 33.90 kg/m   Wt Readings from Last 3 Encounters:  01/09/20 173 lb (78.5 kg)  07/25/19 173 lb (78.5 kg)  05/09/19 171 lb 4 oz (77.7 kg)    Physical Exam Vitals and nursing note reviewed.  Constitutional:      General: She is awake. She is not in acute distress.    Appearance: She is well-developed. She is not ill-appearing.  HENT:     Head: Normocephalic and atraumatic.     Right Ear: Hearing, tympanic membrane, ear canal and external ear normal. No drainage.     Left Ear: Hearing, tympanic membrane, ear canal and external ear normal. No drainage.     Nose: Nose normal.     Right Sinus: No maxillary sinus tenderness or frontal sinus tenderness.     Left Sinus: No maxillary sinus tenderness or frontal sinus tenderness.     Mouth/Throat:     Mouth: Mucous membranes are moist.     Pharynx: Oropharynx is clear. Uvula midline. No pharyngeal swelling, oropharyngeal exudate or posterior oropharyngeal erythema.     Comments: Whitish discoloration/coating  to tongue, no ulcers or lesions noted. Eyes:     General: Lids are normal.        Right eye: No  discharge.        Left eye: No discharge.     Extraocular Movements: Extraocular movements intact.     Conjunctiva/sclera: Conjunctivae normal.     Pupils: Pupils are equal, round, and reactive to light.     Visual Fields: Right eye visual fields normal and left eye visual fields normal.  Neck:     Thyroid: No thyromegaly.     Vascular: No carotid bruit.     Trachea: Trachea normal.  Cardiovascular:     Rate and Rhythm: Normal rate and regular rhythm.     Heart sounds: Normal heart sounds. No murmur heard.  No gallop.      Comments: Varicose veins bilateral lower extremity. Pulmonary:     Effort: Pulmonary effort is normal. No accessory muscle usage or respiratory distress.     Breath sounds: Normal breath sounds.  Chest:     Breasts:        Right: Normal.        Left: Normal.  Abdominal:     General: Bowel sounds are normal.     Palpations: Abdomen is soft. There is no hepatomegaly or splenomegaly.     Tenderness: There is no abdominal tenderness.  Genitourinary:    Exam position: Lithotomy position.     Labia:        Right: No rash.        Left: No rash.      Vagina: Normal.     Cervix: Normal.     Rectum: Normal.     Comments: Declined chaperone.  Cervix anterior, viewed, pap sample obtained and sent.  History of partial hysterectomy, no uterus/ovaries. Musculoskeletal:        General: Normal range of motion.     Cervical back: Normal range of motion and neck supple.     Right lower leg: Edema (trace) present.     Left lower leg: Edema (trace) present.  Lymphadenopathy:     Head:     Right side of head: No submental, submandibular, tonsillar, preauricular or posterior auricular adenopathy.     Left side of head: No submental, submandibular, tonsillar, preauricular or posterior auricular adenopathy.     Cervical: No cervical adenopathy.     Upper Body:     Right upper body: No supraclavicular, axillary or pectoral adenopathy.     Left upper body: No supraclavicular,  axillary or pectoral adenopathy.  Skin:    General: Skin is warm and dry.     Capillary Refill: Capillary refill takes less than 2 seconds.     Findings: Rash present.     Comments: Mild erythema under bilateral breast and midline breasts.  Skin intact with no drainage.  Neurological:     Mental Status: She is alert and oriented to person, place, and time.     Cranial Nerves: Cranial nerves are intact.     Gait: Gait is intact.     Deep Tendon Reflexes: Reflexes are normal and symmetric.     Reflex Scores:      Brachioradialis reflexes are 2+ on the right side and 2+ on the left side.      Patellar reflexes are 2+ on the right side and 2+ on the left side. Psychiatric:        Attention and Perception: Attention normal.  Mood and Affect: Mood normal.        Speech: Speech normal.        Behavior: Behavior normal. Behavior is cooperative.        Thought Content: Thought content normal.        Judgment: Judgment normal.     Results for orders placed or performed in visit on 09/09/19  Comprehensive metabolic panel  Result Value Ref Range   Glucose 87 65 - 99 mg/dL   BUN 13 6 - 24 mg/dL   Creatinine, Ser 0.75 0.57 - 1.00 mg/dL   GFR calc non Af Amer 88 >59 mL/min/1.73   GFR calc Af Amer 101 >59 mL/min/1.73   BUN/Creatinine Ratio 17 9 - 23   Sodium 141 134 - 144 mmol/L   Potassium 4.0 3.5 - 5.2 mmol/L   Chloride 106 96 - 106 mmol/L   CO2 21 20 - 29 mmol/L   Calcium 9.5 8.7 - 10.2 mg/dL   Total Protein 6.9 6.0 - 8.5 g/dL   Albumin 4.1 3.8 - 4.9 g/dL   Globulin, Total 2.8 1.5 - 4.5 g/dL   Albumin/Globulin Ratio 1.5 1.2 - 2.2   Bilirubin Total 0.4 0.0 - 1.2 mg/dL   Alkaline Phosphatase 111 39 - 117 IU/L   AST 23 0 - 40 IU/L   ALT 39 (H) 0 - 32 IU/L  Lipid Panel w/o Chol/HDL Ratio  Result Value Ref Range   Cholesterol, Total 241 (H) 100 - 199 mg/dL   Triglycerides 384 (H) 0 - 149 mg/dL   HDL 37 (L) >39 mg/dL   VLDL Cholesterol Cal 70 (H) 5 - 40 mg/dL   LDL Chol Calc  (NIH) 134 (H) 0 - 99 mg/dL  Gamma GT  Result Value Ref Range   GGT 214 (H) 0 - 60 IU/L      Assessment & Plan:   Problem List Items Addressed This Visit      Cardiovascular and Mediastinum   Essential hypertension, benign    Chronic, with BP mildly elevated in office on initial check and continues to have occasional above goal at home, goal <130/80.  Will increase Losartan to 100 MG, discussed with patient, and continue Metoprolol XL daily + monitoring BP at home + collaboration with cardiology.  Recommend focus on DASH diet. TSH, CBC, CMP today.  Return in 6 weeks for HTN follow-up.      Relevant Medications   losartan (COZAAR) 100 MG tablet   Other Relevant Orders   CBC with Differential/Platelet   Comprehensive metabolic panel   TSH   Varicose vein of leg    Chronic, ongoing.  No pain.  Recommend use of compression hose on during day and off at night.  May take Tylenol as needed for pain.  Refer to vascular if poor response to simple treatment.      Relevant Medications   losartan (COZAAR) 100 MG tablet     Respiratory   Centrilobular emphysema (HCC)    Chronic, stable with no inhalers.  Recommend she call to schedule her yearly lung screening and provided number.  Will plan on spirometry next visit.  If worsening symptoms initiate inhaler regimen.      Allergic rhinitis    Chronic, stable.  Continue current medication regimen and adjust as needed.          Digestive   Thrush, oral    Noted on exam, script for Nystatin swish and swallow.  Notify provider if worsening or ongoing.  Relevant Medications   Zoster Vaccine Adjuvanted [facility]) injection (Start on 02/09/2020)   Zoster Vaccine Adjuvanted [facility]) injection   nystatin (MYCOSTATIN) 100000 UNIT/ML suspension   nystatin cream (MYCOSTATIN)     Musculoskeletal and Integument   Rash    Acute under breast, suspect yeast related.  Script for Nystatin sent and she is to notify provider if worsening or  ongoing symptoms.        Other   Dyslipidemia    Chronic, ongoing.  Continue current medication regimen and adjust as needed, monitor LFT as has history of elevation.  Pravastatin being tolerated well with no major increased in LFT noted.  Check CMP and lipid panel today.      Relevant Orders   Lipid Panel w/o Chol/HDL Ratio   Smoking history    Recommend she continue yearly lung screening and to schedule this, provided pamphlet with number to call and schedule.      Elevated serum GGT level    Check CMP and GGT today.      Relevant Orders   Comprehensive metabolic panel   Gamma GT   Obesity (BMI 30.0-34.9)    BMI 33.90.  Recommended eating smaller high protein, low fat meals more frequently and exercising 30 mins a day 5 times a week with a goal of 10-15lb weight loss in the next 3 months. Patient voiced their understanding and motivation to adhere to these recommendations.       Vitamin D deficiency    Continue supplement and check Vit D level today.      Generalized anxiety disorder    Chronic, stable with Trazodone and continuation of Buspar. Continue current medication regimen and adjust as needed.  Denies SI/HI.      Relevant Medications   busPIRone (BUSPAR) 5 MG tablet   traZODone (DESYREL) 50 MG tablet    Other Visit Diagnoses    Routine general medical examination at a health care facility    -  Primary   Close exposure to COVID-19 virus       Covid antibody lab per patient request obtained.   Relevant Orders   SAR CoV2 Serology (COVID 19)AB(IGG)IA   Encounter for screening mammogram for malignant neoplasm of breast       Mammogram ordered   Relevant Orders   MM DIGITAL SCREENING BILATERAL   VITAMIN D 25 Hydroxy (Vit-D Deficiency, Fractures)   Colon cancer screening       GI referral placed, due in November   Relevant Orders   Ambulatory referral to Gastroenterology   Cervical cancer screening       Pap sample sent   Relevant Orders   Cytology - PAP         Follow up plan: Return in about 6 weeks (around 02/20/2020) for HTN and needs spirometry.   LABORATORY TESTING:  - Pap smear: pap done  IMMUNIZATIONS:   - Tdap: Tetanus vaccination status reviewed: ordered today - Influenza: Up to date - Pneumovax: Not applicable - Prevnar: Not applicable - HPV: Not applicable - Zostavax vaccine: ordered today  SCREENING: -Mammogram: Ordered today  - Colonoscopy: Ordered today  - Bone Density: Not applicable  -Hearing Test: Not applicable  -Spirometry: Refused   PATIENT COUNSELING:   Advised to take 1 mg of folate supplement per day if capable of pregnancy.   Sexuality: Discussed sexually transmitted diseases, partner selection, use of condoms, avoidance of unintended pregnancy  and contraceptive alternatives.   Advised to avoid cigarette smoking.  I discussed with  the patient that most people either abstain from alcohol or drink within safe limits (<=14/week and <=4 drinks/occasion for males, <=7/weeks and <= 3 drinks/occasion for females) and that the risk for alcohol disorders and other health effects rises proportionally with the number of drinks per week and how often a drinker exceeds daily limits.  Discussed cessation/primary prevention of drug use and availability of treatment for abuse.   Diet: Encouraged to adjust caloric intake to maintain  or achieve ideal body weight, to reduce intake of dietary saturated fat and total fat, to limit sodium intake by avoiding high sodium foods and not adding table salt, and to maintain adequate dietary potassium and calcium preferably from fresh fruits, vegetables, and low-fat dairy products.    stressed the importance of regular exercise  Injury prevention: Discussed safety belts, safety helmets, smoke detector, smoking near bedding or upholstery.   Dental health: Discussed importance of regular tooth brushing, flossing, and dental visits.    NEXT PREVENTATIVE PHYSICAL DUE IN 1  YEAR. Return in about 6 weeks (around 02/20/2020) for HTN and needs spirometry.

## 2020-01-09 NOTE — Assessment & Plan Note (Signed)
Chronic, ongoing.  No pain.  Recommend use of compression hose on during day and off at night.  May take Tylenol as needed for pain.  Refer to vascular if poor response to simple treatment.

## 2020-01-09 NOTE — Assessment & Plan Note (Signed)
Check CMP and GGT today. 

## 2020-01-09 NOTE — Patient Instructions (Addendum)
[facility]  Address: [address], [place], [place] [address]  Phone: [phone]  TAKE 2 Losartan 50 MG tablets to equal 100 MG a day until complete and then go pick up new prescription for 100 MG tablets.  Healthy Eating Following a healthy eating pattern may help you to achieve and maintain a healthy body weight, reduce the risk of chronic disease, and live a long and productive life. It is important to follow a healthy eating pattern at an appropriate calorie level for your body. Your nutritional needs should be met primarily through food by choosing a variety of nutrient-rich foods. What are tips for following this plan? Reading food labels  Read labels and choose the following: ? Reduced or low sodium. ? Juices with 100% fruit juice. ? Foods with low saturated fats and high polyunsaturated and monounsaturated fats. ? Foods with whole grains, such as whole wheat, cracked wheat, brown rice, and wild rice. ? Whole grains that are fortified with folic acid. This is recommended for women who are pregnant or who want to become pregnant.  Read labels and avoid the following: ? Foods with a lot of added sugars. These include foods that contain brown sugar, corn sweetener, corn syrup, dextrose, fructose, glucose, high-fructose corn syrup, honey, invert sugar, lactose, malt syrup, maltose, molasses, raw sugar, sucrose, trehalose, or turbinado sugar.  Do not eat more than the following amounts of added sugar per day:  6 teaspoons (25 g) for women.  9 teaspoons (38 g) for men. ? Foods that contain processed or refined starches and grains. ? Refined grain products, such as white flour, degermed cornmeal, white bread, and white rice. Shopping  Choose nutrient-rich snacks, such as vegetables, whole fruits, and nuts. Avoid high-calorie and high-sugar snacks, such as potato chips, fruit snacks, and candy.  Use oil-based dressings and spreads on foods  instead of solid fats such as butter, stick margarine, or cream cheese.  Limit pre-made sauces, mixes, and "instant" products such as flavored rice, instant noodles, and ready-made pasta.  Try more plant-protein sources, such as tofu, tempeh, black beans, edamame, lentils, nuts, and seeds.  Explore eating plans such as the Mediterranean diet or vegetarian diet. Cooking  Use oil to saut or stir-fry foods instead of solid fats such as butter, stick margarine, or lard.  Try baking, boiling, grilling, or broiling instead of frying.  Remove the fatty part of meats before cooking.  Steam vegetables in water or broth. Meal planning   At meals, imagine dividing your plate into fourths: ? One-half of your plate is fruits and vegetables. ? One-fourth of your plate is whole grains. ? One-fourth of your plate is protein, especially lean meats, poultry, eggs, tofu, beans, or nuts.  Include low-fat dairy as part of your daily diet. Lifestyle  Choose healthy options in all settings, including home, work, school, restaurants, or stores.  Prepare your food safely: ? Wash your hands after handling raw meats. ? Keep food preparation surfaces clean by regularly washing with hot, soapy water. ? Keep raw meats separate from ready-to-eat foods, such as fruits and vegetables. ? Cook seafood, meat, poultry, and eggs to the recommended internal temperature. ? Store foods at safe temperatures. In general:  Keep cold foods at 70F (4.4C) or below.  Keep hot foods at 170F (60C) or above.  Keep your freezer at [facility] (-17.8C) or below.  Foods are no longer safe to eat when they have been between the temperatures of 40-170F (  4.4-60C) for more than 2 hours. What foods should I eat? Fruits Aim to eat 2 cup-equivalents of fresh, canned (in natural juice), or frozen fruits each day. Examples of 1 cup-equivalent of fruit include 1 small apple, 8 large strawberries, 1 cup canned fruit,  cup dried  fruit, or 1 cup 100% juice. Vegetables Aim to eat 2-3 cup-equivalents of fresh and frozen vegetables each day, including different varieties and colors. Examples of 1 cup-equivalent of vegetables include 2 medium carrots, 2 cups raw, leafy greens, 1 cup chopped vegetable (raw or cooked), or 1 medium baked potato. Grains Aim to eat 6 ounce-equivalents of whole grains each day. Examples of 1 ounce-equivalent of grains include 1 slice of bread, 1 cup ready-to-eat cereal, 3 cups popcorn, or  cup cooked rice, pasta, or cereal. Meats and other proteins Aim to eat 5-6 ounce-equivalents of protein each day. Examples of 1 ounce-equivalent of protein include 1 egg, 1/2 cup nuts or seeds, or 1 tablespoon (16 g) peanut butter. A cut of meat or fish that is the size of a deck of cards is about 3-4 ounce-equivalents.  Of the protein you eat each week, try to have at least 8 ounces come from seafood. This includes salmon, trout, herring, and anchovies. Dairy Aim to eat 3 cup-equivalents of fat-free or low-fat dairy each day. Examples of 1 cup-equivalent of dairy include 1 cup (240 mL) milk, 8 ounces (250 g) yogurt, 1 ounces (44 g) natural cheese, or 1 cup (240 mL) fortified soy milk. Fats and oils  Aim for about 5 teaspoons (21 g) per day. Choose monounsaturated fats, such as canola and olive oils, avocados, peanut butter, and most nuts, or polyunsaturated fats, such as sunflower, corn, and soybean oils, walnuts, pine nuts, sesame seeds, sunflower seeds, and flaxseed. Beverages  Aim for six 8-oz glasses of water per day. Limit coffee to three to five 8-oz cups per day.  Limit caffeinated beverages that have added calories, such as soda and energy drinks.  Limit alcohol intake to no more than 1 drink a day for nonpregnant women and 2 drinks a day for men. One drink equals 12 oz of beer (355 mL), 5 oz of wine (148 mL), or 1 oz of hard liquor (44 mL). Seasoning and other foods  Avoid adding excess amounts  of salt to your foods. Try flavoring foods with herbs and spices instead of salt.  Avoid adding sugar to foods.  Try using oil-based dressings, sauces, and spreads instead of solid fats. This information is based on general [place] nutrition guidelines. For more information, visit [employer]. Exact amounts may vary based on your nutrition needs. Summary  A healthy eating plan may help you to maintain a healthy weight, reduce the risk of chronic diseases, and stay active throughout your life.  Plan your meals. Make sure you eat the right portions of a variety of nutrient-rich foods.  Try baking, boiling, grilling, or broiling instead of frying.  Choose healthy options in all settings, including home, work, school, restaurants, or stores. This information is not intended to replace advice given to you by your health care provider. Make sure you discuss any questions you have with your health care provider. Document Revised: 09/24/2017 Document Reviewed: 09/24/2017 Elsevier Patient Education  [place].

## 2020-01-09 NOTE — Assessment & Plan Note (Signed)
Chronic, stable with Trazodone and continuation of Buspar. Continue current medication regimen and adjust as needed.  Denies SI/HI.

## 2020-01-09 NOTE — Assessment & Plan Note (Signed)
Chronic, stable with no inhalers.  Recommend she call to schedule her yearly lung screening and provided number.  Will plan on spirometry next visit.  If worsening symptoms initiate inhaler regimen. 

## 2020-01-09 NOTE — Assessment & Plan Note (Signed)
Noted on exam, script for Nystatin swish and swallow.  Notify provider if worsening or ongoing.

## 2020-01-09 NOTE — Assessment & Plan Note (Signed)
Continue supplement and check Vit D level today.

## 2020-01-09 NOTE — Assessment & Plan Note (Signed)
Chronic, with BP mildly elevated in office on initial check and continues to have occasional above goal at home, goal <130/80.  Will increase Losartan to 100 MG, discussed with patient, and continue Metoprolol XL daily + monitoring BP at home + collaboration with cardiology.  Recommend focus on DASH diet. TSH, CBC, CMP today.  Return in 6 weeks for HTN follow-up.

## 2020-01-09 NOTE — Assessment & Plan Note (Signed)
Recommend she continue yearly lung screening and to schedule this, provided pamphlet with number to call and schedule.

## 2020-01-09 NOTE — Assessment & Plan Note (Signed)
Acute under breast, suspect yeast related.  Script for Nystatin sent and she is to notify provider if worsening or ongoing symptoms.

## 2020-01-09 NOTE — Assessment & Plan Note (Signed)
Chronic, ongoing.  Continue current medication regimen and adjust as needed, monitor LFT as has history of elevation.  Pravastatin being tolerated well with no major increased in LFT noted.  Check CMP and lipid panel today.

## 2020-01-10 LAB — CBC WITH DIFFERENTIAL/PLATELET
Basophils Absolute: 0.1 10*3/uL (ref 0.0–0.2)
Basos: 1 %
EOS (ABSOLUTE): 0.2 10*3/uL (ref 0.0–0.4)
Eos: 2 %
Hematocrit: 47.2 % — ABNORMAL HIGH (ref 34.0–46.6)
Hemoglobin: 16.3 g/dL — ABNORMAL HIGH (ref 11.1–15.9)
Immature Grans (Abs): 0 10*3/uL (ref 0.0–0.1)
Immature Granulocytes: 0 %
Lymphocytes Absolute: 3.7 10*3/uL — ABNORMAL HIGH (ref 0.7–3.1)
Lymphs: 41 %
MCH: 29.7 pg (ref 26.6–33.0)
MCHC: 34.5 g/dL (ref 31.5–35.7)
MCV: 86 fL (ref 79–97)
Monocytes Absolute: 0.7 10*3/uL (ref 0.1–0.9)
Monocytes: 8 %
Neutrophils Absolute: 4.4 10*3/uL (ref 1.4–7.0)
Neutrophils: 48 %
Platelets: 243 10*3/uL (ref 150–450)
RBC: 5.49 x10E6/uL — ABNORMAL HIGH (ref 3.77–5.28)
RDW: 12.4 % (ref 11.7–15.4)
WBC: 9.1 10*3/uL (ref 3.4–10.8)

## 2020-01-10 LAB — SAR COV2 SEROLOGY (COVID19)AB(IGG),IA: DiaSorin SARS-CoV-2 Ab, IgG: POSITIVE

## 2020-01-10 LAB — COMPREHENSIVE METABOLIC PANEL
ALT: 33 IU/L — ABNORMAL HIGH (ref 0–32)
AST: 24 IU/L (ref 0–40)
Albumin/Globulin Ratio: 1.8 (ref 1.2–2.2)
Albumin: 4.5 g/dL (ref 3.8–4.9)
Alkaline Phosphatase: 118 IU/L (ref 48–121)
BUN/Creatinine Ratio: 16 (ref 12–28)
BUN: 13 mg/dL (ref 8–27)
Bilirubin Total: 0.6 mg/dL (ref 0.0–1.2)
CO2: 22 mmol/L (ref 20–29)
Calcium: 9.7 mg/dL (ref 8.7–10.3)
Chloride: 99 mmol/L (ref 96–106)
Creatinine, Ser: 0.81 mg/dL (ref 0.57–1.00)
GFR calc Af Amer: 91 mL/min/{1.73_m2} (ref >59)
GFR calc non Af Amer: 79 mL/min/{1.73_m2} (ref >59)
Globulin, Total: 2.5 g/dL (ref 1.5–4.5)
Glucose: 62 mg/dL — ABNORMAL LOW (ref 65–99)
Potassium: 4.2 mmol/L (ref 3.5–5.2)
Sodium: 142 mmol/L (ref 134–144)
Total Protein: 7 g/dL (ref 6.0–8.5)

## 2020-01-10 LAB — LIPID PANEL W/O CHOL/HDL RATIO
Cholesterol, Total: 255 mg/dL — ABNORMAL HIGH (ref 100–199)
HDL: 42 mg/dL (ref >39)
LDL Chol Calc (NIH): 185 mg/dL — ABNORMAL HIGH (ref 0–99)
Triglycerides: 149 mg/dL (ref 0–149)
VLDL Cholesterol Cal: 28 mg/dL (ref 5–40)

## 2020-01-10 LAB — VITAMIN D 25 HYDROXY (VIT D DEFICIENCY, FRACTURES): Vit D, 25-Hydroxy: 21.2 ng/mL — ABNORMAL LOW (ref 30.0–100.0)

## 2020-01-10 LAB — TSH: TSH: 1.5 u[IU]/mL (ref 0.450–4.500)

## 2020-01-10 LAB — GAMMA GT: GGT: 193 IU/L — ABNORMAL HIGH (ref 0–60)

## 2020-01-11 NOTE — Progress Notes (Signed)
Contacted via [place] evening Allison Pham, your labs have returned: - Covid antibody testing is positive -- meaning at some point you did have Covid - Vitamin D level remains low, I recommend increasing your Vitamin D3 to 2000 units daily and will recheck next visit. - Cholesterol levels remain elevated, are you taking Pravastatin daily?  Would be good to try going up on dose since liver function remains stable.  Would you like to try this? - Thyroid normal - Kidney function normal Any questions? Keep being awesome!! Kindest regards, Macklyn Glandon

## 2020-01-12 ENCOUNTER — Telehealth: Payer: Self-pay | Admitting: Nurse Practitioner

## 2020-01-12 NOTE — Telephone Encounter (Signed)
Copied from [place] [id number]. Topic: General - Other >> Jan 12, 2020  2:36 PM Leward Quan A wrote: Reason for CRM: Patient called to inquire of Marnee Guarneri about her lab results that say her liver has issues and then another report say that her liver is fine. Patient would like a call back to clarify and states that if she does not answer please leave a detailed message on the VM  Ph# [phone]

## 2020-01-12 NOTE — Telephone Encounter (Signed)
Routing to provider to advise.  

## 2020-01-12 NOTE — Telephone Encounter (Signed)
This is MyChart message: Good evening Allison Pham, your labs have returned: - Covid antibody testing is positive -- meaning at some point you did have Covid. - Vitamin D level remains low, I recommend increasing your Vitamin D3 to 2000 units daily and will recheck next visit. - Cholesterol levels remain elevated, are you taking Pravastatin daily?  Would be good to try going up on dose since liver function remains stable (they are continuing to look good).  Would you like to try this? - Thyroid normal - Kidney function normal Any questions? Keep being awesome!! Kindest regards, Dawnya Grams

## 2020-01-13 ENCOUNTER — Other Ambulatory Visit: Payer: Self-pay | Admitting: Nurse Practitioner

## 2020-01-13 MED ORDER — PRAVASTATIN SODIUM 40 MG PO TABS
40.0000 mg | ORAL_TABLET | Freq: Every day | ORAL | 3 refills | Status: DC
Start: 2020-01-13 — End: 2020-08-18

## 2020-01-13 MED ORDER — TRIAMCINOLONE ACETONIDE 0.1 % EX CREA
1.0000 | TOPICAL_CREAM | Freq: Two times a day (BID) | CUTANEOUS | 0 refills | Status: DC
Start: 2020-01-13 — End: 2020-04-08

## 2020-01-13 NOTE — Telephone Encounter (Signed)
Called and notified patient of [name]'s message. Patient is ok with increasing Pravastatin daily.  Also had a couple of other concerns. She states that the rash that [name] saw at her appointment is a little better with the cream that she was given but states it has not gone away. Patient wants to know if she needs some kind of steroid or something sent in to help clear this up. Also states that she went to the pharmacy and was given the Shingrix vaccine. States the injection site it sore and a little swollen. Wants to know if this is ok or what she should look for.

## 2020-01-13 NOTE — Telephone Encounter (Signed)
I have sent in increase Pravastatin and would like to see her back in office in 8 weeks due to change.  I also sent in a steroid cream for rash to try, if ongoing I recommend follow-up in office next week.  As for vaccine, sore arm and a little swelling is normal.  Place ice to arm as needed, which will help.  I tried calling her this evening and left HIPAA complaint message stating we would call with info in morning,

## 2020-01-14 LAB — CYTOLOGY - PAP
Comment: NEGATIVE
Diagnosis: UNDETERMINED — AB
High risk HPV: NEGATIVE

## 2020-01-14 NOTE — Progress Notes (Signed)
Contacted via Allison Pham evening Allison Pham, I called and left message for you to review this message and hope you see it.  Please let me know if you do.  Your pap has returned, HPV is negative -- great news means low risk for cervical cancer.  However, the cytology, assessment of cervical tissue did show what we call ASCUS-US.  This simply mean finding of abnormal cells in the tissue that lines the outer part of the cervix and is the most common abnormal finding we see on pap smears.  This does not mean cancer is present, especially since HPV was negative.  It does mean we need to repeat your pap in one year.  If at that time it results the same then we would send you to GYN for further assessment.  Any questions?

## 2020-01-14 NOTE — Telephone Encounter (Signed)
Pt advised and verbalized understanding.

## 2020-01-14 NOTE — Telephone Encounter (Signed)
Called and LVM asking for patient to please return my call.  

## 2020-01-14 NOTE — Telephone Encounter (Signed)
Read from [facility] to pt and she verbalized understanding. She stated that she now has a rash on the back of both arms that itches pretty bad as well as a rash on the back of her neck. She would like to know if she should use the same steroid cream on all of the affected areas. Please advise.

## 2020-01-14 NOTE — Telephone Encounter (Signed)
Recommend she use steroid cream prescribed.  If worsening or ongoing rash then would like her to return to office for further evaluation.

## 2020-02-16 ENCOUNTER — Other Ambulatory Visit: Payer: Self-pay

## 2020-02-16 ENCOUNTER — Encounter: Payer: Self-pay | Admitting: Nurse Practitioner

## 2020-02-16 ENCOUNTER — Ambulatory Visit (INDEPENDENT_AMBULATORY_CARE_PROVIDER_SITE_OTHER): Payer: Medicare Other | Admitting: Nurse Practitioner

## 2020-02-16 VITALS — BP 108/74 | HR 80 | Temp 98.0°F | Wt 172.0 lb

## 2020-02-16 DIAGNOSIS — E669 Obesity, unspecified: Secondary | ICD-10-CM

## 2020-02-16 DIAGNOSIS — E785 Hyperlipidemia, unspecified: Secondary | ICD-10-CM

## 2020-02-16 DIAGNOSIS — J432 Centrilobular emphysema: Secondary | ICD-10-CM

## 2020-02-16 DIAGNOSIS — I10 Essential (primary) hypertension: Secondary | ICD-10-CM

## 2020-02-16 DIAGNOSIS — Z87891 Personal history of nicotine dependence: Secondary | ICD-10-CM | POA: Diagnosis not present

## 2020-02-16 DIAGNOSIS — E559 Vitamin D deficiency, unspecified: Secondary | ICD-10-CM

## 2020-02-16 DIAGNOSIS — B351 Tinea unguium: Secondary | ICD-10-CM

## 2020-02-16 MED ORDER — CICLOPIROX 8 % EX SOLN: Freq: Every day | CUTANEOUS | 2 refills | Status: DC

## 2020-02-16 NOTE — Progress Notes (Signed)
BP 108/74   Pulse 80   Temp 98 F (36.7 C) (Oral)   Wt 172 lb (78 kg)   SpO2 96%   BMI 33.70 kg/m    Subjective:    Patient ID: Allison Pham, female    DOB: Nov 06, 1960, 59 y.o.   MRN: [id number]  HPI: Allison Pham is a 59 y.o. female  Chief Complaint  Patient presents with  . Hypertension    4 week f/up   HYPERTENSION / HYPERLIPIDEMIA Presents for follow-up after increase of Losartan on 01/09/20.  Continues on Metoprolol XL 25 MG daily, Pravastatin 20 MG daily, and Losartan 100 MG daily, ASA. Saw cardiology last on 05/09/2019.  Has history of elevation in LFTs which is improving.  Last ALT/AST 24/33 and GGT 193.  History of smoking starting at age 40, 1 PPD, quit 4 years ago.  Last went for lung screening 03/14/2018, noted centrilobular and paraseptal emphysema.  Her Vitamin D level was a little low on recent labs with 21.2, she increased Vitamin D3 to 2000 units. Satisfied with current treatment?yes Duration of hypertension:chronic BP monitoring frequency:daily BP range:130-140/70-80 prior to meds and after medications like it is today BP medication side effects:no Duration of hyperlipidemia:chronic Cholesterol medication side effects:no Cholesterol supplements: none Medication compliance:good compliance Aspirin:yes Recent stressors:no Recurrent headaches:no Visual changes:no Palpitations:no Dyspnea:no Chest pain:no Lower extremity edema:no Dizzy/lightheaded:no  FUNGUS TO TOENAILS: Reports ongoing fungus to toenails on right foot and would like some medication to help with this.  Denis any skin breakdown or loss of toenails.  No odor or drainage.  Relevant past medical, surgical, family and social history reviewed and updated as indicated. Interim medical history since our last visit reviewed. Allergies and medications reviewed and updated.  Review of Systems  Constitutional: Negative for activity change, appetite change, diaphoresis,  fatigue and fever.  Respiratory: Negative for cough, chest tightness and shortness of breath.   Cardiovascular: Negative for chest pain, palpitations and leg swelling.  Gastrointestinal: Negative.   Neurological: Negative.   Psychiatric/Behavioral: Negative.     Per HPI unless specifically indicated above     Objective:    BP 108/74   Pulse 80   Temp 98 F (36.7 C) (Oral)   Wt 172 lb (78 kg)   SpO2 96%   BMI 33.70 kg/m   Wt Readings from Last 3 Encounters:  02/16/20 172 lb (78 kg)  01/09/20 173 lb (78.5 kg)  07/25/19 173 lb (78.5 kg)    Physical Exam Vitals and nursing note reviewed.  Constitutional:      General: She is awake. She is not in acute distress.    Appearance: She is well-developed and overweight. She is not ill-appearing.  HENT:     Head: Normocephalic.     Right Ear: Hearing normal.     Left Ear: Hearing normal.  Eyes:     General: Lids are normal.        Right eye: No discharge.        Left eye: No discharge.     Conjunctiva/sclera: Conjunctivae normal.     Pupils: Pupils are equal, round, and reactive to light.  Neck:     Vascular: No carotid bruit.  Cardiovascular:     Rate and Rhythm: Normal rate and regular rhythm.     Pulses:          Dorsalis pedis pulses are 2+ on the right side and 2+ on the left side.       Posterior tibial  pulses are 2+ on the right side and 2+ on the left side.     Heart sounds: Normal heart sounds. No murmur heard.  No gallop.   Pulmonary:     Effort: Pulmonary effort is normal. No accessory muscle usage or respiratory distress.     Breath sounds: Normal breath sounds.  Abdominal:     General: Bowel sounds are normal.     Palpations: Abdomen is soft.  Musculoskeletal:     Cervical back: Normal range of motion and neck supple.     Right lower leg: No edema.     Left lower leg: No edema.  Feet:     Right foot:     Toenail Condition: Fungal disease present.    Left foot:     Toenail Condition: Left toenails are  normal.  Skin:    General: Skin is warm and dry.  Neurological:     Mental Status: She is alert and oriented to person, place, and time.  Psychiatric:        Attention and Perception: Attention normal.        Mood and Affect: Mood normal.        Speech: Speech normal.        Behavior: Behavior normal. Behavior is cooperative.        Thought Content: Thought content normal.     Results for orders placed or performed in visit on 01/09/20  CBC with Differential/Platelet  Result Value Ref Range   WBC 9.1 3.4 - 10.8 x10E3/uL   RBC 5.49 (H) 3.77 - 5.28 x10E6/uL   Hemoglobin 16.3 (H) 11.1 - 15.9 g/dL   Hematocrit 47.2 (H) 34.0 - 46.6 %   MCV 86 79 - 97 fL   MCH 29.7 26.6 - 33.0 pg   MCHC 34.5 31 - 35 g/dL   RDW 12.4 11.7 - 15.4 %   Platelets 243 150 - 450 x10E3/uL   Neutrophils 48 Not Estab. %   Lymphs 41 Not Estab. %   Monocytes 8 Not Estab. %   Eos 2 Not Estab. %   Basos 1 Not Estab. %   Neutrophils Absolute 4.4 1 - 7 x10E3/uL   Lymphocytes Absolute 3.7 (H) 0 - 3 x10E3/uL   Monocytes Absolute 0.7 0 - 0 x10E3/uL   EOS (ABSOLUTE) 0.2 0.0 - 0.4 x10E3/uL   Basophils Absolute 0.1 0 - 0 x10E3/uL   Immature Granulocytes 0 Not Estab. %   Immature Grans (Abs) 0.0 0.0 - 0.1 x10E3/uL  Comprehensive metabolic panel  Result Value Ref Range   Glucose 62 (L) 65 - 99 mg/dL   BUN 13 8 - 27 mg/dL   Creatinine, Ser 0.81 0.57 - 1.00 mg/dL   GFR calc non Af Amer 79 >59 mL/min/1.73   GFR calc Af Amer 91 >59 mL/min/1.73   BUN/Creatinine Ratio 16 12 - 28   Sodium 142 134 - 144 mmol/L   Potassium 4.2 3.5 - 5.2 mmol/L   Chloride 99 96 - 106 mmol/L   CO2 22 20 - 29 mmol/L   Calcium 9.7 8.7 - 10.3 mg/dL   Total Protein 7.0 6.0 - 8.5 g/dL   Albumin 4.5 3.8 - 4.9 g/dL   Globulin, Total 2.5 1.5 - 4.5 g/dL   Albumin/Globulin Ratio 1.8 1.2 - 2.2   Bilirubin Total 0.6 0.0 - 1.2 mg/dL   Alkaline Phosphatase 118 48 - 121 IU/L   AST 24 0 - 40 IU/L   ALT 33 (H) 0 - 32 IU/L  Lipid Panel w/o Chol/HDL  Ratio  Result Value Ref Range   Cholesterol, Total 255 (H) 100 - 199 mg/dL   Triglycerides 149 0 - 149 mg/dL   HDL 42 >39 mg/dL   VLDL Cholesterol Cal 28 5 - 40 mg/dL   LDL Chol Calc (NIH) 185 (H) 0 - 99 mg/dL  TSH  Result Value Ref Range   TSH 1.500 0.450 - 4.500 uIU/mL  VITAMIN D 25 Hydroxy (Vit-D Deficiency, Fractures)  Result Value Ref Range   Vit D, 25-Hydroxy 21.2 (L) 30.0 - 100.0 ng/mL  Gamma GT  Result Value Ref Range   GGT 193 (H) 0 - 60 IU/L  SAR CoV2 Serology (COVID 19)AB(IGG)IA  Result Value Ref Range   DiaSorin SARS-CoV-2 Ab, IgG Positive Negative  Cytology - PAP  Result Value Ref Range   High risk HPV Negative    Adequacy      Satisfactory for evaluation; transformation zone component PRESENT.   Diagnosis (A)     - Atypical squamous cells of undetermined significance (ASC-US)   Comment Normal Reference Range HPV - Negative       Assessment & Plan:   Problem List Items Addressed This Visit      Cardiovascular and Mediastinum   Essential hypertension, benign    Chronic, with BP improved on home reading and in office, tolerating increase in Losartan.  Will continue Losartan 100 MG, discussed with patient, and continue Metoprolol XL daily + monitoring BP at home + collaboration with cardiology.  Recommend focus on DASH diet. BMP today.  Return in 6 months for follow-up.      Relevant Orders   Basic metabolic panel     Respiratory   Centrilobular emphysema (HCC) - Primary    Chronic, stable with no inhalers.  Recommend she call to schedule her yearly lung screening and provided number.  Will plan on spirometry next visit.  If worsening symptoms initiate inhaler regimen.        Other   Dyslipidemia    Chronic, ongoing.  Continue current medication regimen and adjust as needed, monitor LFT as has history of elevation.  Pravastatin being tolerated well with no major increased in LFT noted, in fact LFT improving.  May consider adjusting statin regimen at next  visit.      Smoking history    Recommend she continue yearly lung screening and to schedule this, provided pamphlet with number to call and schedule.      Obesity (BMI 30.0-34.9)    Recommended eating smaller high protein, low fat meals more frequently and exercising 30 mins a day 5 times a week with a goal of 10-15lb weight loss in the next 3 months. Patient voiced their understanding and motivation to adhere to these recommendations.       Vitamin D deficiency    Continue supplement and check Vit D level today.      Relevant Orders   VITAMIN D 25 Hydroxy (Vit-D Deficiency, Fractures)    Other Visit Diagnoses    Onychomycosis       To right foot toenails, Penlac script sent and educated patient on this.   Relevant Medications   ciclopirox (PENLAC) 8 % solution       Follow up plan: Return in about 6 months (around 08/18/2020) for HTN/HLD.

## 2020-02-16 NOTE — Assessment & Plan Note (Signed)
Recommended eating smaller high protein, low fat meals more frequently and exercising 30 mins a day 5 times a week with a goal of 10-15lb weight loss in the next 3 months. Patient voiced their understanding and motivation to adhere to these recommendations.  

## 2020-02-16 NOTE — Assessment & Plan Note (Signed)
Chronic, stable with no inhalers.  Recommend she call to schedule her yearly lung screening and provided number.  Will plan on spirometry next visit.  If worsening symptoms initiate inhaler regimen.

## 2020-02-16 NOTE — Assessment & Plan Note (Signed)
Chronic, ongoing.  Continue current medication regimen and adjust as needed, monitor LFT as has history of elevation.  Pravastatin being tolerated well with no major increased in LFT noted, in fact LFT improving.  May consider adjusting statin regimen at next visit.

## 2020-02-16 NOTE — Assessment & Plan Note (Signed)
Recommend she continue yearly lung screening and to schedule this, provided pamphlet with number to call and schedule.

## 2020-02-16 NOTE — Assessment & Plan Note (Signed)
Chronic, with BP improved on home reading and in office, tolerating increase in Losartan.  Will continue Losartan 100 MG, discussed with patient, and continue Metoprolol XL daily + monitoring BP at home + collaboration with cardiology.  Recommend focus on DASH diet. BMP today.  Return in 6 months for follow-up.

## 2020-02-16 NOTE — Patient Instructions (Signed)
DASH Eating Plan DASH stands for "Dietary Approaches to Stop Hypertension." The DASH eating plan is a healthy eating plan that has been shown to reduce high blood pressure (hypertension). It may also reduce your risk for type 2 diabetes, heart disease, and stroke. The DASH eating plan may also help with weight loss. What are tips for following this plan?  General guidelines  Avoid eating more than 2,300 mg (milligrams) of salt (sodium) a day. If you have hypertension, you may need to reduce your sodium intake to 1,500 mg a day.  Limit alcohol intake to no more than 1 drink a day for nonpregnant women and 2 drinks a day for men. One drink equals 12 oz of beer, 5 oz of wine, or 1 oz of hard liquor.  Work with your health care provider to maintain a healthy body weight or to lose weight. Ask what an ideal weight is for you.  Get at least 30 minutes of exercise that causes your heart to beat faster (aerobic exercise) most days of the week. Activities may include walking, swimming, or biking.  Work with your health care provider or diet and nutrition specialist (dietitian) to adjust your eating plan to your individual calorie needs. Reading food labels   Check food labels for the amount of sodium per serving. Choose foods with less than 5 percent of the Daily Value of sodium. Generally, foods with less than 300 mg of sodium per serving fit into this eating plan.  To find whole grains, look for the word "whole" as the first word in the ingredient list. Shopping  Buy products labeled as "low-sodium" or "no salt added."  Buy fresh foods. Avoid canned foods and premade or frozen meals. Cooking  Avoid adding salt when cooking. Use salt-free seasonings or herbs instead of table salt or sea salt. Check with your health care provider or pharmacist before using salt substitutes.  Do not fry foods. Cook foods using healthy methods such as baking, boiling, grilling, and broiling instead.  Cook with  heart-healthy oils, such as olive, canola, soybean, or sunflower oil. Meal planning  Eat a balanced diet that includes: ? 5 or more servings of fruits and vegetables each day. At each meal, try to fill half of your plate with fruits and vegetables. ? Up to 6-8 servings of whole grains each day. ? Less than 6 oz of lean meat, poultry, or fish each day. A 3-oz serving of meat is about the same size as a deck of cards. One egg equals 1 oz. ? 2 servings of low-fat dairy each day. ? A serving of nuts, seeds, or beans 5 times each week. ? Heart-healthy fats. Healthy fats called Omega-3 fatty acids are found in foods such as flaxseeds and coldwater fish, like sardines, salmon, and mackerel.  Limit how much you eat of the following: ? Canned or prepackaged foods. ? Food that is high in trans fat, such as fried foods. ? Food that is high in saturated fat, such as fatty meat. ? Sweets, desserts, sugary drinks, and other foods with added sugar. ? Full-fat dairy products.  Do not salt foods before eating.  Try to eat at least 2 vegetarian meals each week.  Eat more home-cooked food and less restaurant, buffet, and fast food.  When eating at a restaurant, ask that your food be prepared with less salt or no salt, if possible. What foods are recommended? The items listed may not be a complete list. Talk with your dietitian about   what dietary choices are best for you. Grains Whole-grain or whole-wheat bread. Whole-grain or whole-wheat pasta. Brown rice. Oatmeal. Quinoa. Bulgur. Whole-grain and low-sodium cereals. Pita bread. Low-fat, low-sodium crackers. Whole-wheat flour tortillas. Vegetables Fresh or frozen vegetables (raw, steamed, roasted, or grilled). Low-sodium or reduced-sodium tomato and vegetable juice. Low-sodium or reduced-sodium tomato sauce and tomato paste. Low-sodium or reduced-sodium canned vegetables. Fruits All fresh, dried, or frozen fruit. Canned fruit in natural juice (without  added sugar). Meat and other protein foods Skinless chicken or turkey. Ground chicken or turkey. Pork with fat trimmed off. Fish and seafood. Egg whites. Dried beans, peas, or lentils. Unsalted nuts, nut butters, and seeds. Unsalted canned beans. Lean cuts of beef with fat trimmed off. Low-sodium, lean deli meat. Dairy Low-fat (1%) or fat-free (skim) milk. Fat-free, low-fat, or reduced-fat cheeses. Nonfat, low-sodium ricotta or cottage cheese. Low-fat or nonfat yogurt. Low-fat, low-sodium cheese. Fats and oils Soft margarine without trans fats. Vegetable oil. Low-fat, reduced-fat, or light mayonnaise and salad dressings (reduced-sodium). Canola, safflower, olive, soybean, and sunflower oils. Avocado. Seasoning and other foods Herbs. Spices. Seasoning mixes without salt. Unsalted popcorn and pretzels. Fat-free sweets. What foods are not recommended? The items listed may not be a complete list. Talk with your dietitian about what dietary choices are best for you. Grains Baked goods made with fat, such as croissants, muffins, or some breads. Dry pasta or rice meal packs. Vegetables Creamed or fried vegetables. Vegetables in a cheese sauce. Regular canned vegetables (not low-sodium or reduced-sodium). Regular canned tomato sauce and paste (not low-sodium or reduced-sodium). Regular tomato and vegetable juice (not low-sodium or reduced-sodium). Pickles. Olives. Fruits Canned fruit in a light or heavy syrup. Fried fruit. Fruit in cream or butter sauce. Meat and other protein foods Fatty cuts of meat. Ribs. Fried meat. Bacon. Sausage. Bologna and other processed lunch meats. Salami. Fatback. Hotdogs. Bratwurst. Salted nuts and seeds. Canned beans with added salt. Canned or smoked fish. Whole eggs or egg yolks. Chicken or turkey with skin. Dairy Whole or 2% milk, cream, and half-and-half. Whole or full-fat cream cheese. Whole-fat or sweetened yogurt. Full-fat cheese. Nondairy creamers. Whipped toppings.  Processed cheese and cheese spreads. Fats and oils Butter. Stick margarine. Lard. Shortening. Ghee. Bacon fat. Tropical oils, such as coconut, palm kernel, or palm oil. Seasoning and other foods Salted popcorn and pretzels. Onion salt, garlic salt, seasoned salt, table salt, and sea salt. Worcestershire sauce. Tartar sauce. Barbecue sauce. Teriyaki sauce. Soy sauce, including reduced-sodium. Steak sauce. Canned and packaged gravies. Fish sauce. Oyster sauce. Cocktail sauce. Horseradish that you find on the shelf. Ketchup. Mustard. Meat flavorings and tenderizers. Bouillon cubes. Hot sauce and Tabasco sauce. Premade or packaged marinades. Premade or packaged taco seasonings. Relishes. Regular salad dressings. Where to find more information:  National Heart, Lung, and Blood Institute: www.nhlbi.nih.gov  American Heart Association: www.heart.org Summary  The DASH eating plan is a healthy eating plan that has been shown to reduce high blood pressure (hypertension). It may also reduce your risk for type 2 diabetes, heart disease, and stroke.  With the DASH eating plan, you should limit salt (sodium) intake to 2,300 mg a day. If you have hypertension, you may need to reduce your sodium intake to 1,500 mg a day.  When on the DASH eating plan, aim to eat more fresh fruits and vegetables, whole grains, lean proteins, low-fat dairy, and heart-healthy fats.  Work with your health care provider or diet and nutrition specialist (dietitian) to adjust your eating plan to your   individual calorie needs. This information is not intended to replace advice given to you by your health care provider. Make sure you discuss any questions you have with your health care provider. Document Revised: 05/25/2017 Document Reviewed: 06/05/2016 Elsevier Patient Education  2020 [employer]  

## 2020-02-16 NOTE — Assessment & Plan Note (Signed)
Continue supplement and check Vit D level today.

## 2020-02-17 LAB — BASIC METABOLIC PANEL
BUN/Creatinine Ratio: 22 (ref 12–28)
BUN: 17 mg/dL (ref 8–27)
CO2: 26 mmol/L (ref 20–29)
Calcium: 10.2 mg/dL (ref 8.7–10.3)
Chloride: 103 mmol/L (ref 96–106)
Creatinine, Ser: 0.79 mg/dL (ref 0.57–1.00)
GFR calc Af Amer: 94 mL/min/{1.73_m2} (ref >59)
GFR calc non Af Amer: 82 mL/min/{1.73_m2} (ref >59)
Glucose: 81 mg/dL (ref 65–99)
Potassium: 4.6 mmol/L (ref 3.5–5.2)
Sodium: 142 mmol/L (ref 134–144)

## 2020-02-17 LAB — VITAMIN D 25 HYDROXY (VIT D DEFICIENCY, FRACTURES): Vit D, 25-Hydroxy: 22.6 ng/mL — ABNORMAL LOW (ref 30.0–100.0)

## 2020-02-17 NOTE — Progress Notes (Signed)
Contacted via [place] morning Allison Pham, your labs have returned.  Kidney function remains stable.  Vitamin D remains a little on low side.  How much are you taking every day?  We may need to go up a little more.  Let me know.  Have a great day!! Keep being awesome!!  Thank you for allowing me to participate in your care. Kindest regards, Elayjah Chaney

## 2020-04-08 ENCOUNTER — Other Ambulatory Visit: Payer: Self-pay | Admitting: Nurse Practitioner

## 2020-04-08 MED ORDER — TRIAMCINOLONE ACETONIDE 0.1 % EX CREA: 1.0000 "application " | TOPICAL_CREAM | Freq: Two times a day (BID) | CUTANEOUS | 0 refills | Status: DC

## 2020-04-08 NOTE — Telephone Encounter (Signed)
Medication Refill - Medication: triamcinolone cream (KENALOG) 0.1 %    Preferred Pharmacy (with phone number or street name):  [place], [place] - [place] Phone:  [phone]  Fax:  [phone]       Agent: Please be advised that RX refills may take up to 3 business days. We ask that you follow-up with your pharmacy.

## 2020-06-17 ENCOUNTER — Telehealth: Payer: Self-pay | Admitting: Nurse Practitioner

## 2020-06-17 NOTE — Telephone Encounter (Signed)
Chrys Racer NP with [employer] through the [employer] and discovered that the patient has mild to moderate peripheral  artery disease in L&R leg. Will be sending the visit in the mail. The member was educated about this varicose veins. CB- [phone]

## 2020-06-21 NOTE — Telephone Encounter (Signed)
Noted, will discuss next visit

## 2020-06-21 NOTE — Telephone Encounter (Signed)
Routing to provider, FYI.  

## 2020-07-07 ENCOUNTER — Other Ambulatory Visit: Payer: Self-pay | Admitting: Nurse Practitioner

## 2020-07-07 NOTE — Telephone Encounter (Signed)
Requested Prescriptions  Pending Prescriptions Disp Refills  . traZODone (DESYREL) 50 MG tablet [Pharmacy Med Name: TRAZODONE 50 MG TABLET] 90 tablet 0    Sig: Take 0.5 tablets (25 mg total) by mouth at bedtime.     Psychiatry: Antidepressants - Serotonin Modulator Passed - 07/07/2020 11:36 AM      Passed - Valid encounter within last 6 months    Recent Outpatient Visits          4 months ago Centrilobular emphysema ([place])   [place], Barbaraann Faster, NP   6 months ago Routine general medical examination at a health care facility   [facility], [place] T, NP   10 months ago Essential hypertension, benign   [place], Barbaraann Faster, NP   11 months ago Hordeolum externum of right lower eyelid   [place], Henrine Screws T, NP   1 year ago Essential hypertension, benign   [place], Barbaraann Faster, NP      Future Appointments            In 1 month [place], Barbaraann Faster, NP [facility]

## 2020-07-16 ENCOUNTER — Other Ambulatory Visit: Payer: Self-pay | Admitting: Nurse Practitioner

## 2020-07-16 MED ORDER — OLMESARTAN MEDOXOMIL 40 MG PO TABS: 40.0000 mg | ORAL_TABLET | Freq: Every day | ORAL | 0 refills | Status: DC

## 2020-07-16 NOTE — Progress Notes (Signed)
Supply shortage per pharmacy.

## 2020-07-16 NOTE — Telephone Encounter (Signed)
Patient notified of medication change and verbalized understanding.

## 2020-07-16 NOTE — Telephone Encounter (Signed)
   Notes to clinic:  MANUFACTURER BACKORDER PLEASE CHANGE TO ALTERNATE THERAPY   Requested Prescriptions  Pending Prescriptions Disp Refills   losartan (COZAAR) 100 MG tablet 90 tablet 4    Sig: Take 1 tablet (100 mg total) by mouth daily.      Cardiovascular:  Angiotensin Receptor Blockers Passed - 07/16/2020 10:53 AM      Passed - Cr in normal range and within 180 days    Creat  Date Value Ref Range Status  02/11/2018 0.80 0.50 - 1.05 mg/dL Final    Comment:    For patients >25 years of age, the reference limit for Creatinine is approximately 13% higher for people identified as African-American. .    Creatinine, Ser  Date Value Ref Range Status  02/16/2020 0.79 0.57 - 1.00 mg/dL Final          Passed - K in normal range and within 180 days    Potassium  Date Value Ref Range Status  02/16/2020 4.6 3.5 - 5.2 mmol/L Final          Passed - Patient is not pregnant      Passed - Last BP in normal range    BP Readings from Last 1 Encounters:  02/16/20 108/74          Passed - Valid encounter within last 6 months    Recent Outpatient Visits           5 months ago Centrilobular emphysema ([place])   [place], Henrine Screws T, NP   6 months ago Routine general medical examination at a health care facility   [facility], [place] T, NP   10 months ago Essential hypertension, benign   [place], Barbaraann Faster, NP   11 months ago Hordeolum externum of right lower eyelid   [place], [place] T, NP   1 year ago Essential hypertension, benign   [place], Barbaraann Faster, NP       Future Appointments             In 1 month Cannady, Barbaraann Faster, NP [facility]

## 2020-07-16 NOTE — Telephone Encounter (Signed)
I will go in patient chart and change to Olmesartan, but can you alert patient to this short term change until supplies are back in.  I know she will be anxious about this change.

## 2020-08-18 ENCOUNTER — Ambulatory Visit (INDEPENDENT_AMBULATORY_CARE_PROVIDER_SITE_OTHER): Payer: Medicare Other | Admitting: Nurse Practitioner

## 2020-08-18 ENCOUNTER — Other Ambulatory Visit: Payer: Self-pay

## 2020-08-18 ENCOUNTER — Encounter: Payer: Self-pay | Admitting: Nurse Practitioner

## 2020-08-18 VITALS — BP 119/80 | HR 96 | Temp 98.4°F | Wt 168.2 lb

## 2020-08-18 DIAGNOSIS — J432 Centrilobular emphysema: Secondary | ICD-10-CM

## 2020-08-18 DIAGNOSIS — Z87891 Personal history of nicotine dependence: Secondary | ICD-10-CM

## 2020-08-18 DIAGNOSIS — R748 Abnormal levels of other serum enzymes: Secondary | ICD-10-CM | POA: Diagnosis not present

## 2020-08-18 DIAGNOSIS — I10 Essential (primary) hypertension: Secondary | ICD-10-CM

## 2020-08-18 DIAGNOSIS — Z1211 Encounter for screening for malignant neoplasm of colon: Secondary | ICD-10-CM

## 2020-08-18 DIAGNOSIS — L309 Dermatitis, unspecified: Secondary | ICD-10-CM

## 2020-08-18 DIAGNOSIS — E559 Vitamin D deficiency, unspecified: Secondary | ICD-10-CM | POA: Diagnosis not present

## 2020-08-18 DIAGNOSIS — E785 Hyperlipidemia, unspecified: Secondary | ICD-10-CM

## 2020-08-18 DIAGNOSIS — F411 Generalized anxiety disorder: Secondary | ICD-10-CM | POA: Diagnosis not present

## 2020-08-18 DIAGNOSIS — E669 Obesity, unspecified: Secondary | ICD-10-CM

## 2020-08-18 MED ORDER — OLMESARTAN MEDOXOMIL 40 MG PO TABS: 40.0000 mg | ORAL_TABLET | Freq: Every day | ORAL | 4 refills | Status: DC

## 2020-08-18 MED ORDER — TRIAMCINOLONE ACETONIDE 0.1 % EX CREA: 1.0000 "application " | TOPICAL_CREAM | Freq: Two times a day (BID) | CUTANEOUS | 0 refills | Status: DC

## 2020-08-18 MED ORDER — PRAVASTATIN SODIUM 40 MG PO TABS: 40.0000 mg | ORAL_TABLET | Freq: Every day | ORAL | 4 refills | Status: DC

## 2020-08-18 MED ORDER — TRAZODONE HCL 50 MG PO TABS
25.0000 mg | ORAL_TABLET | Freq: Every day | ORAL | 4 refills | Status: DC
Start: 2020-08-18 — End: 2021-07-13

## 2020-08-18 MED ORDER — METOPROLOL SUCCINATE ER 25 MG PO TB24: 25.0000 mg | ORAL_TABLET | Freq: Every day | ORAL | 4 refills | Status: DC

## 2020-08-18 NOTE — Progress Notes (Signed)
BP 119/80   Pulse 96   Temp 98.4 F (36.9 C) (Oral)   Wt 168 lb 3.2 oz (76.3 kg)   SpO2 96%   BMI 32.96 kg/m    Subjective:    Patient ID: Allison Pham, female    DOB: [birth year], 60 y.o.   MRN: [id number]  HPI: Allison Pham is a 60 y.o. female  Chief Complaint  Patient presents with  . Follow-up    Pt states she would like to discuss blood pressure medications and also talk about dry skin   HYPERTENSION / HYPERLIPIDEMIA Continues on Metoprolol XL 25 MG daily, Pravastatin 20 MG daily, and Olmesartan 40 MG daily, ASA. Saw cardiology last on 05/09/2019.  Has history of elevation in LFTs which is improving. Last ALT/AST 24/33 and GGT 193.    Her Vitamin D level was a little low recent level 22.6.  Has dry skin to bilateral hands would like cream for. Satisfied with current treatment?yes Duration of hypertension:chronic BP monitoring frequency:daily BP range:130-140/70-80 on average, occasional elevations with stressors BP medication side effects:no Duration of hyperlipidemia:chronic Cholesterol medication side effects:no Cholesterol supplements: none Medication compliance:good compliance Aspirin:yes Recent stressors:no Recurrent headaches:no Visual changes:no Palpitations:no Dyspnea:no Chest pain:no Lower extremity edema:no Dizzy/lightheaded:no  ANXIETY/STRESS Continues on Buspar takes 1-2 a day and Trazodone 25 MG for sleep.  She reports today she was not allowed to go to school until 13, which was difficult -- can not fully read and not spell well due to this.  Also has history of seeing her daughter going through addiction in past.   Duration:stable Anxious mood: occasional  Excessive worrying: no Irritability: no  Sweating: no Nausea: no Palpitations:no Hyperventilation: no Panic attacks: no Agoraphobia: no  Obscessions/compulsions: no Depressed mood: occasional Depression screen [facility] 2/9 01/09/2020 04/25/2019 02/28/2019 01/20/2019  07/02/2018  Decreased Interest 2 1 1 3 2   Down, Depressed, Hopeless 2 1 1  0 2  PHQ - 2 Score 4 2 2 3 4   Altered sleeping 1 1 1 3 2   Tired, decreased energy 1 1 1 1 2   Change in appetite 1 0 0 0 0  Feeling bad or failure about yourself  1 1 0 0 1  Trouble concentrating 0 0 0 1 0  Moving slowly or fidgety/restless 0 0 0 0 0  Suicidal thoughts 0 0 0 0 0  PHQ-9 Score 8 5 4 8 9   Difficult doing work/chores Not difficult at all Not difficult at all Not difficult at all Somewhat difficult Somewhat difficult  Some recent data might be hidden   Anhedonia: no Weight changes: no Insomnia: improved with Trazodone Hypersomnia: no Fatigue/loss of energy: no Feelings of worthlessness: no Feelings of guilt: no Impaired concentration/indecisiveness: no Suicidal ideations: no  Crying spells: no Recent Stressors/Life Changes: no   Relationship problems: no   Family stress: no     Financial stress: no    Job stress: no    Recent death/loss: no  COPD History of smoking starting at age 24, 1 PPD, quit 4 years ago.  Last went for lung screening 03/14/2018, noted centrilobular and paraseptal emphysema.  No current inhalers. COPD status: stable Satisfied with current treatment?: yes Oxygen use: no Dyspnea frequency: none Cough frequency: none Rescue inhaler frequency: does not have Limitation of activity: no Productive cough:  Last Spirometry:  Pneumovax: refuses Influenza: refuses  Relevant past medical, surgical, family and social history reviewed and updated as indicated. Interim medical history since our last visit reviewed. Allergies and medications  reviewed and updated.  Review of Systems  Constitutional: Negative for activity change, appetite change, diaphoresis, fatigue and fever.  Respiratory: Negative for cough, chest tightness and shortness of breath.   Cardiovascular: Negative for chest pain, palpitations and leg swelling.  Gastrointestinal: Negative.   Neurological: Negative.    Psychiatric/Behavioral: Negative.     Per HPI unless specifically indicated above     Objective:    BP 119/80   Pulse 96   Temp 98.4 F (36.9 C) (Oral)   Wt 168 lb 3.2 oz (76.3 kg)   SpO2 96%   BMI 32.96 kg/m   Wt Readings from Last 3 Encounters:  08/18/20 168 lb 3.2 oz (76.3 kg)  02/16/20 172 lb (78 kg)  01/09/20 173 lb (78.5 kg)    Physical Exam Vitals and nursing note reviewed.  Constitutional:      General: She is awake. She is not in acute distress.    Appearance: She is well-developed and overweight. She is not ill-appearing.  HENT:     Head: Normocephalic.     Right Ear: Hearing normal.     Left Ear: Hearing normal.  Eyes:     General: Lids are normal.        Right eye: No discharge.        Left eye: No discharge.     Conjunctiva/sclera: Conjunctivae normal.     Pupils: Pupils are equal, round, and reactive to light.  Neck:     Vascular: No carotid bruit.  Cardiovascular:     Rate and Rhythm: Normal rate and regular rhythm.     Heart sounds: Normal heart sounds. No murmur heard. No gallop.   Pulmonary:     Effort: Pulmonary effort is normal. No accessory muscle usage or respiratory distress.     Breath sounds: Normal breath sounds.  Abdominal:     General: Bowel sounds are normal.     Palpations: Abdomen is soft.  Musculoskeletal:     Cervical back: Normal range of motion and neck supple.     Right lower leg: No edema.     Left lower leg: No edema.  Skin:    General: Skin is warm and dry.     Comments: Mild xerosis bilateral hands with no rashes.  Scaling appearance.   Neurological:     Mental Status: She is alert and oriented to person, place, and time.  Psychiatric:        Attention and Perception: Attention normal.        Mood and Affect: Mood normal.        Speech: Speech normal.        Behavior: Behavior normal. Behavior is cooperative.        Thought Content: Thought content normal.    Results for orders placed or performed in visit on  63/01/60  Basic metabolic panel  Result Value Ref Range   Glucose 81 65 - 99 mg/dL   BUN 17 8 - 27 mg/dL   Creatinine, Ser 0.79 0.57 - 1.00 mg/dL   GFR calc non Af Amer 82 >59 mL/min/1.73   GFR calc Af Amer 94 >59 mL/min/1.73   BUN/Creatinine Ratio 22 12 - 28   Sodium 142 134 - 144 mmol/L   Potassium 4.6 3.5 - 5.2 mmol/L   Chloride 103 96 - 106 mmol/L   CO2 26 20 - 29 mmol/L   Calcium 10.2 8.7 - 10.3 mg/dL  VITAMIN D 25 Hydroxy (Vit-D Deficiency, Fractures)  Result Value Ref Range  Vit D, 25-Hydroxy 22.6 (L) 30.0 - 100.0 ng/mL      Assessment & Plan:   Problem List Items Addressed This Visit      Cardiovascular and Mediastinum   Essential hypertension, benign    Chronic, with BP at goal today and on home readings.  Recommend she monitor BP at least a few mornings a week at home and document.  DASH diet at home.  Continue current medication regimen and adjust as needed, refills sent in.  Labs today: CMP, urine ALB.  Return in 6 months.       Relevant Medications   metoprolol succinate (TOPROL-XL) 25 MG 24 hr tablet   olmesartan (BENICAR) 40 MG tablet   pravastatin (PRAVACHOL) 40 MG tablet   Other Relevant Orders   Microalbumin, Urine Waived     Respiratory   Centrilobular emphysema ([facility]) - Primary    Chronic, stable with no inhalers.  Will plan on spirometry next visit.  If worsening symptoms initiate inhaler regimen.  Continue yearly lung screening -- recommend she call to schedule this and pamphlet provided.        Other   Dyslipidemia    Chronic, ongoing.  Continue current medication regimen and adjust as needed, monitor LFT as has history of elevation.  Pravastatin being tolerated well with no major increased in LFT noted, in fact LFT improving.  May consider adjusting statin regimen at next visit.      Relevant Medications   pravastatin (PRAVACHOL) 40 MG tablet   Other Relevant Orders   Comprehensive metabolic panel   Lipid Panel w/o Chol/HDL Ratio   Smoking  history    Recommend she continue yearly lung screening and to schedule this, provided pamphlet with number to call and schedule.      Elevated serum GGT level    Check CMP and GGT today.      Relevant Orders   Comprehensive metabolic panel   Gamma GT   Obesity (BMI 30.0-34.9)    BMI 32.96.  Recommended eating smaller high protein, low fat meals more frequently and exercising 30 mins a day 5 times a week with a goal of 10-15lb weight loss in the next 3 months. Patient voiced their understanding and motivation to adhere to these recommendations.       Vitamin D deficiency    Continue supplement and check Vit D level today.      Relevant Orders   VITAMIN D 25 Hydroxy (Vit-D Deficiency, Fractures)   Generalized anxiety disorder    Chronic, stable with Trazodone and continuation of Buspar. Continue current medication regimen and adjust as needed.  Denies SI/HI.  Refills sent in.  Therapy referral placed, would benefit from CBT.      Relevant Medications   traZODone (DESYREL) 50 MG tablet   Other Relevant Orders   Ambulatory referral to Psychology    Other Visit Diagnoses    Dermatitis       To hands, Triamcinolone cream sent in.   Colon cancer screening       GI referral placed   Relevant Orders   Ambulatory referral to Gastroenterology       Follow up plan: Return in about 5 months (around 01/10/2021) for Annual physical.

## 2020-08-18 NOTE — Assessment & Plan Note (Addendum)
Chronic, with BP at goal today and on home readings.  Recommend she monitor BP at least a few mornings a week at home and document.  DASH diet at home.  Continue current medication regimen and adjust as needed, refills sent in.  Labs today: CMP, urine ALB.  Return in 6 months.

## 2020-08-18 NOTE — Assessment & Plan Note (Signed)
Check CMP and GGT today. 

## 2020-08-18 NOTE — Assessment & Plan Note (Signed)
BMI 32.96.  Recommended eating smaller high protein, low fat meals more frequently and exercising 30 mins a day 5 times a week with a goal of 10-15lb weight loss in the next 3 months. Patient voiced their understanding and motivation to adhere to these recommendations. ? ?

## 2020-08-18 NOTE — Assessment & Plan Note (Signed)
Recommend she continue yearly lung screening and to schedule this, provided pamphlet with number to call and schedule.

## 2020-08-18 NOTE — Assessment & Plan Note (Signed)
Continue supplement and check Vit D level today.

## 2020-08-18 NOTE — Patient Instructions (Signed)

## 2020-08-18 NOTE — Assessment & Plan Note (Signed)
Chronic, stable with Trazodone and continuation of Buspar. Continue current medication regimen and adjust as needed.  Denies SI/HI.  Refills sent in.  Therapy referral placed, would benefit from CBT.

## 2020-08-18 NOTE — Assessment & Plan Note (Signed)
Chronic, stable with no inhalers.  Will plan on spirometry next visit.  If worsening symptoms initiate inhaler regimen.  Continue yearly lung screening -- recommend she call to schedule this and pamphlet provided.

## 2020-08-18 NOTE — Assessment & Plan Note (Signed)
Chronic, ongoing.  Continue current medication regimen and adjust as needed, monitor LFT as has history of elevation.  Pravastatin being tolerated well with no major increased in LFT noted, in fact LFT improving.  May consider adjusting statin regimen at next visit.

## 2020-08-19 ENCOUNTER — Other Ambulatory Visit: Payer: Self-pay | Admitting: Nurse Practitioner

## 2020-08-19 LAB — COMPREHENSIVE METABOLIC PANEL
ALT: 23 IU/L (ref 0–32)
AST: 24 IU/L (ref 0–40)
Albumin/Globulin Ratio: 1.6 (ref 1.2–2.2)
Albumin: 4.4 g/dL (ref 3.8–4.9)
Alkaline Phosphatase: 120 IU/L (ref 44–121)
BUN/Creatinine Ratio: 26 (ref 12–28)
BUN: 23 mg/dL (ref 8–27)
Bilirubin Total: 0.3 mg/dL (ref 0.0–1.2)
CO2: 23 mmol/L (ref 20–29)
Calcium: 9.3 mg/dL (ref 8.7–10.3)
Chloride: 106 mmol/L (ref 96–106)
Creatinine, Ser: 0.88 mg/dL (ref 0.57–1.00)
GFR calc Af Amer: 83 mL/min/{1.73_m2} (ref >59)
GFR calc non Af Amer: 72 mL/min/{1.73_m2} (ref >59)
Globulin, Total: 2.7 g/dL (ref 1.5–4.5)
Glucose: 104 mg/dL — ABNORMAL HIGH (ref 65–99)
Potassium: 4 mmol/L (ref 3.5–5.2)
Sodium: 144 mmol/L (ref 134–144)
Total Protein: 7.1 g/dL (ref 6.0–8.5)

## 2020-08-19 LAB — LIPID PANEL W/O CHOL/HDL RATIO
Cholesterol, Total: 246 mg/dL — ABNORMAL HIGH (ref 100–199)
HDL: 39 mg/dL — ABNORMAL LOW (ref >39)
LDL Chol Calc (NIH): 143 mg/dL — ABNORMAL HIGH (ref 0–99)
Triglycerides: 349 mg/dL — ABNORMAL HIGH (ref 0–149)
VLDL Cholesterol Cal: 64 mg/dL — ABNORMAL HIGH (ref 5–40)

## 2020-08-19 LAB — MICROALBUMIN, URINE WAIVED
Creatinine, Urine Waived: 300 mg/dL (ref 10–300)
Microalb, Ur Waived: 30 mg/L — ABNORMAL HIGH (ref 0–19)
Microalb/Creat Ratio: <30 mg/g (ref <30)

## 2020-08-19 LAB — VITAMIN D 25 HYDROXY (VIT D DEFICIENCY, FRACTURES): Vit D, 25-Hydroxy: 24.4 ng/mL — ABNORMAL LOW (ref 30.0–100.0)

## 2020-08-19 LAB — GAMMA GT: GGT: 198 IU/L — ABNORMAL HIGH (ref 0–60)

## 2020-08-19 MED ORDER — CHOLECALCIFEROL 1.25 MG (50000 UT) PO TABS: 1.0000 | ORAL_TABLET | ORAL | 2 refills | Status: DC

## 2020-08-19 NOTE — Progress Notes (Signed)
Contacted via [place] afternoon Allison Pham, your labs have returned.  Your liver function testing is now all nice and normal.  GGT which can look at gall bladder is still slightly elevated and we will continue to monitor.  Vitamin D remains low.  I am going to switch you to a weekly higher dose of this and stop your daily dose.  Cholesterol levels remain elevated, however continue Pravastatin at this time and in future we may try switching to Rosuvastatin since liver tests improved.  Any questions? Keep being awesome!!  Thank you for allowing me to participate in your care. Kindest regards, Henrine Screws'

## 2020-08-30 ENCOUNTER — Encounter: Payer: Self-pay | Admitting: *Deleted

## 2020-10-05 ENCOUNTER — Ambulatory Visit (INDEPENDENT_AMBULATORY_CARE_PROVIDER_SITE_OTHER): Payer: Medicare Other | Admitting: Psychology

## 2020-10-05 DIAGNOSIS — F4323 Adjustment disorder with mixed anxiety and depressed mood: Secondary | ICD-10-CM | POA: Diagnosis not present

## 2020-10-12 ENCOUNTER — Ambulatory Visit (INDEPENDENT_AMBULATORY_CARE_PROVIDER_SITE_OTHER): Payer: Medicare Other | Admitting: Psychology

## 2020-10-12 DIAGNOSIS — F4323 Adjustment disorder with mixed anxiety and depressed mood: Secondary | ICD-10-CM

## 2020-10-19 ENCOUNTER — Ambulatory Visit: Payer: Medicare Other | Admitting: Psychology

## 2020-10-22 ENCOUNTER — Telehealth: Payer: Self-pay

## 2020-10-22 NOTE — Telephone Encounter (Signed)
Spoke to [place] on telephone, she alerted provider that her sister Allison Pham committed suicide yesterday by gun shot.  She stated they had been trying to help her, but did not expect this.  They had been struggling trying to get her insurance fixed.  Patient thought IRS was coming after her, they had caregiver in house helping her.  Investigator came out to home.  Jasmynn wanted to let PCP know as update and thank PCP for all her care.

## 2020-10-22 NOTE — Telephone Encounter (Signed)
Copied from [place] [id number]. Topic: General - Other >> Oct 22, 2020 10:34 AM Celene Kras wrote: Reason for CRM: Pt calling stating that her sister passed away yesterday. Pt states that she committed suicide and that she is not okay. She is requesting to let pcp know. Please advise.

## 2020-10-27 ENCOUNTER — Ambulatory Visit (INDEPENDENT_AMBULATORY_CARE_PROVIDER_SITE_OTHER): Payer: Medicare Other | Admitting: Psychology

## 2020-10-27 DIAGNOSIS — F4323 Adjustment disorder with mixed anxiety and depressed mood: Secondary | ICD-10-CM | POA: Diagnosis not present

## 2020-11-08 ENCOUNTER — Ambulatory Visit (INDEPENDENT_AMBULATORY_CARE_PROVIDER_SITE_OTHER): Payer: Medicare Other | Admitting: Psychology

## 2020-11-08 DIAGNOSIS — F4323 Adjustment disorder with mixed anxiety and depressed mood: Secondary | ICD-10-CM | POA: Diagnosis not present

## 2020-11-22 ENCOUNTER — Ambulatory Visit (INDEPENDENT_AMBULATORY_CARE_PROVIDER_SITE_OTHER): Payer: Medicare Other | Admitting: Psychology

## 2020-11-22 DIAGNOSIS — F4323 Adjustment disorder with mixed anxiety and depressed mood: Secondary | ICD-10-CM

## 2020-12-14 ENCOUNTER — Ambulatory Visit: Payer: Medicare Other | Admitting: Psychology

## 2021-01-09 ENCOUNTER — Encounter: Payer: Self-pay | Admitting: Nurse Practitioner

## 2021-01-09 DIAGNOSIS — F320 Major depressive disorder, single episode, mild: Secondary | ICD-10-CM | POA: Insufficient documentation

## 2021-01-10 ENCOUNTER — Ambulatory Visit (INDEPENDENT_AMBULATORY_CARE_PROVIDER_SITE_OTHER): Payer: Medicare Other | Admitting: Nurse Practitioner

## 2021-01-10 ENCOUNTER — Encounter: Payer: Self-pay | Admitting: Nurse Practitioner

## 2021-01-10 ENCOUNTER — Other Ambulatory Visit (HOSPITAL_COMMUNITY)
Admission: RE | Admit: 2021-01-10 | Discharge: 2021-01-10 | Disposition: A | Discharge status: Home / Self Care | Payer: Medicare Other | Source: Ambulatory Visit | Attending: Nurse Practitioner | Admitting: Nurse Practitioner

## 2021-01-10 ENCOUNTER — Other Ambulatory Visit: Payer: Self-pay

## 2021-01-10 VITALS — BP 128/80 | HR 78 | Temp 98.4°F | Ht 61.0 in | Wt 172.6 lb

## 2021-01-10 DIAGNOSIS — Z124 Encounter for screening for malignant neoplasm of cervix: Secondary | ICD-10-CM | POA: Diagnosis present

## 2021-01-10 DIAGNOSIS — Z87891 Personal history of nicotine dependence: Secondary | ICD-10-CM

## 2021-01-10 DIAGNOSIS — L723 Sebaceous cyst: Secondary | ICD-10-CM | POA: Diagnosis not present

## 2021-01-10 DIAGNOSIS — I83812 Varicose veins of left lower extremities with pain: Secondary | ICD-10-CM

## 2021-01-10 DIAGNOSIS — E785 Hyperlipidemia, unspecified: Secondary | ICD-10-CM | POA: Diagnosis not present

## 2021-01-10 DIAGNOSIS — F320 Major depressive disorder, single episode, mild: Secondary | ICD-10-CM

## 2021-01-10 DIAGNOSIS — Z1231 Encounter for screening mammogram for malignant neoplasm of breast: Secondary | ICD-10-CM

## 2021-01-10 DIAGNOSIS — I10 Essential (primary) hypertension: Secondary | ICD-10-CM | POA: Diagnosis not present

## 2021-01-10 DIAGNOSIS — Z23 Encounter for immunization: Secondary | ICD-10-CM

## 2021-01-10 DIAGNOSIS — E559 Vitamin D deficiency, unspecified: Secondary | ICD-10-CM | POA: Diagnosis not present

## 2021-01-10 DIAGNOSIS — Z1211 Encounter for screening for malignant neoplasm of colon: Secondary | ICD-10-CM

## 2021-01-10 DIAGNOSIS — R87620 Atypical squamous cells of undetermined significance on cytologic smear of vagina (ASC-US): Secondary | ICD-10-CM

## 2021-01-10 DIAGNOSIS — Z0000 Encounter for general adult medical examination without abnormal findings: Secondary | ICD-10-CM

## 2021-01-10 DIAGNOSIS — J432 Centrilobular emphysema: Secondary | ICD-10-CM | POA: Diagnosis not present

## 2021-01-10 DIAGNOSIS — Z800 Family history of malignant neoplasm of digestive organs: Secondary | ICD-10-CM

## 2021-01-10 DIAGNOSIS — F411 Generalized anxiety disorder: Secondary | ICD-10-CM | POA: Diagnosis not present

## 2021-01-10 DIAGNOSIS — E669 Obesity, unspecified: Secondary | ICD-10-CM

## 2021-01-10 MED ORDER — LOSARTAN POTASSIUM 100 MG PO TABS: 100.0000 mg | ORAL_TABLET | Freq: Every day | ORAL | 4 refills | Status: DC

## 2021-01-10 NOTE — Assessment & Plan Note (Signed)
Chronic, stable with no inhalers.  Will plan on spirometry next visit.  If worsening symptoms initiate inhaler regimen.  Continue yearly lung screening -- recommend she call to schedule this and pamphlet provided.

## 2021-01-10 NOTE — Assessment & Plan Note (Signed)
Recommend she continue yearly lung screening and to schedule this, provided pamphlet with number to call and schedule.

## 2021-01-10 NOTE — Assessment & Plan Note (Signed)
Chronic, ongoing.  Continue current medication regimen and adjust as needed, monitor LFT as has history of elevation.  Pravastatin being tolerated well with no major increased in LFT noted, in fact LFT improving.  May consider adjusting statin regimen at next visit.

## 2021-01-10 NOTE — Assessment & Plan Note (Signed)
BMI 32.61. Recommended eating smaller high protein, low fat meals more frequently and exercising 30 mins a day 5 times a week with a goal of 10-15lb weight loss in the next 3 months. Patient voiced their understanding and motivation to adhere to these recommendations.

## 2021-01-10 NOTE — Assessment & Plan Note (Signed)
Chronic, ongoing.  Denies SI/HI.  Reports benefit from current regimen, will continue this and send refills as needed.  Continue therapy visits.  Return in 6 months.

## 2021-01-10 NOTE — Assessment & Plan Note (Signed)
Chronic, ongoing.  No pain.  Recommend use of compression hose on during day and off at night.  May take Tylenol as needed for pain.  Refer to vascular if poor response to simple treatment.

## 2021-01-10 NOTE — Progress Notes (Signed)
BP 128/80 (BP Location: Left Arm)   Pulse 78   Temp 98.4 F (36.9 C) (Oral)   Ht 5\' 1"  (1.549 m)   Wt 172 lb 9.6 oz (78.3 kg)   SpO2 96%   BMI 32.61 kg/m    Subjective:    Patient ID: Allison Pham, female    DOB: [birth year], 60 y.o.   MRN: [id number]  HPI: Allison Pham is a 60 y.o. female presenting on 01/10/2021 for comprehensive medical examination. Current medical complaints include:none  She currently lives with: husband Menopausal Symptoms: no   HYPERTENSION / HYPERLIPIDEMIA Continues on Metoprolol XL 25 MG daily, Pravastatin 20 MG daily, and Losartan 100 MG daily, ASA.  Saw cardiology last on 05/09/2019.  Has history of elevation in LFTs which is improving.  Last ALT/AST 23/24 in February 2022.  History of smoking starting at age 8, 1 PPD, quit 4 years ago.  Previous lung screening, has not had since 2019 -- mild emphysema noted on this.   Satisfied with current treatment? yes Duration of hypertension: chronic BP monitoring frequency: daily BP range: 130-140/80 -- occasional elevations BP medication side effects: no Duration of hyperlipidemia: chronic Cholesterol medication side effects: no Cholesterol supplements: none Medication compliance: good compliance Aspirin: yes Recent stressors: no Recurrent headaches: no Visual changes: no Palpitations: no Dyspnea: no Chest pain: no Lower extremity edema: no Dizzy/lightheaded: no   COPD Centrilobular mild noted on lung CT 2019.  She is a past smoker.  No current symptoms or inhalers.  Did not obtain yearly CT last year, recommend she obtain and provided pamphlet with number to schedule. COPD status: stable Satisfied with current treatment?: yes Oxygen use: no Dyspnea frequency:  none Cough frequency: none Limitation of activity: no Productive cough: none Last Spirometry: unknown Pneumovax:  refuses Influenza:  refuses   VITAMIN D DEFICIENCY: Continues on Vitamin D every day -- last level 21.2 one year  ago.  No recent falls or fractures.  No muscle aches.    ANXIETY Continues on Buspar 2.5 to 5 MG TID, Trazodone for sleep, she reports these medications help a lot.  Recent loss of sister to suicide months back and then recent loss of son in law. Mood status: stable Satisfied with current treatment?: yes Symptom severity: mild  Duration of current treatment : chronic Side effects: no Medication compliance: good compliance Psychotherapy/counseling: current therapy Depressed mood: no Anxious mood: no Anhedonia: no Significant weight loss or gain: no Insomnia: improved with Trazodone Fatigue: no Feelings of worthlessness or guilt: no Impaired concentration/indecisiveness: no Suicidal ideations: no Hopelessness: no Crying spells: no Depression screen [facility] 2/9 01/10/2021 01/09/2020 04/25/2019 02/28/2019 01/20/2019  Decreased Interest 1 2 1 1 3   Down, Depressed, Hopeless 2 2 1 1  0  PHQ - 2 Score 3 4 2 2 3   Altered sleeping 1 1 1 1 3   Tired, decreased energy 0 1 1 1 1   Change in appetite 1 1 0 0 0  Feeling bad or failure about yourself  2 1 1  0 0  Trouble concentrating 0 0 0 0 1  Moving slowly or fidgety/restless 0 0 0 0 0  Suicidal thoughts 0 0 0 0 0  PHQ-9 Score 7 8 5 4 8   Difficult doing work/chores Somewhat difficult Not difficult at all Not difficult at all Not difficult at all Somewhat difficult  Some recent data might be hidden    The patient does not have a history of falls. I did not complete a risk assessment  for falls. A plan of care for falls was not documented.   Past Medical History:  Past Medical History:  Diagnosis Date   Anxiety    Carotid atherosclerosis, bilateral 07/16/2018   rescan Jan 2021   Emphysema of lung ([place]) 03/21/2018   GERD (gastroesophageal reflux disease)    Hypercholesteremia    Hypertension    Tick bite    Vertigo    Wears dentures    partial upper    Surgical History:  Past Surgical History:  Procedure Laterality Date   ABDOMINAL  HYSTERECTOMY     Partial   COLONOSCOPY WITH PROPOFOL N/A 05/10/2017   Procedure: COLONOSCOPY WITH PROPOFOL;  Surgeon: [name], MD;  Location: [place];  Service: Endoscopy;  Laterality: N/A;   HEMORRHOID SURGERY     POLYPECTOMY N/A 05/10/2017   Procedure: POLYPECTOMY;  Surgeon: [name], MD;  Location: [place];  Service: Endoscopy;  Laterality: N/A;    Medications:  Current Outpatient Medications on File Prior to Visit  Medication Sig   aspirin 81 MG chewable tablet Chew 81 mg by mouth as needed.    busPIRone (BUSPAR) 5 MG tablet Take 0.5-1 tablets (2.5-5 mg total) by mouth 3 (three) times daily as needed.   Cholecalciferol 1.25 MG (50000 UT) TABS Take 1 tablet by mouth once a week.   cyclobenzaprine (FLEXERIL) 5 MG tablet Take 1 tablet (5 mg total) by mouth at bedtime as needed for muscle spasms. Do not drive while taking as can cause drowsiness.   fluticasone (FLONASE) 50 MCG/ACT nasal spray Place 1 spray into both nostrils 2 (two) times daily as needed.   loratadine (CLARITIN) 10 MG tablet Take 1 tablet (10 mg total) by mouth daily as needed for allergies.   metoprolol succinate (TOPROL-XL) 25 MG 24 hr tablet Take 1 tablet (25 mg total) by mouth daily.   pravastatin (PRAVACHOL) 40 MG tablet Take 1 tablet (40 mg total) by mouth daily.   traZODone (DESYREL) 50 MG tablet Take 0.5 tablets (25 mg total) by mouth at bedtime.   No current facility-administered medications on file prior to visit.    Allergies:  Allergies  Allergen Reactions   Penicillins Rash    Social History:  Social History   Socioeconomic History   Marital status: Single    Spouse name: Not on file   Number of children: Not on file   Years of education: Not on file   Highest education level: Not on file  Occupational History   Not on file  Tobacco Use   Smoking status: Former    Packs/day: 1.00    Years: 38.00    Pack years: 38.00    Types: Cigarettes    Quit date:  09/13/2015    Years since quitting: 5.3   Smokeless tobacco: Never  Vaping Use   Vaping Use: Never used  Substance and Sexual Activity   Alcohol use: Never    Comment: rare - holidays   Drug use: No   Sexual activity: Not Currently    Birth control/protection: Surgical  Other Topics Concern   Not on file  Social History Narrative   Not on file   Social Determinants of Health   Financial Resource Strain: Not on file  Food Insecurity: Not on file  Transportation Needs: Not on file  Physical Activity: Not on file  Stress: Not on file  Social Connections: Not on file  Intimate Partner Violence: Not on file   Social History   Tobacco Use  Smoking Status Former   Packs/day: 1.00   Years: 38.00   Pack years: 38.00   Types: Cigarettes   Quit date: 09/13/2015   Years since quitting: 5.3  Smokeless Tobacco Never   Social History   Substance and Sexual Activity  Alcohol Use Never   Comment: rare - holidays    Family History:  Family History  Problem Relation Age of Onset   Cancer Mother        Colon   Hypertension Mother    Hypertension Father    Heart attack Father    Heart disease Father    Hypertension Sister    COPD Sister    Hypertension Brother    Heart disease Brother    Cancer Maternal Aunt    Breast cancer Maternal Aunt    Cancer Maternal Uncle    Breast cancer Maternal Aunt    Diabetes Neg Hx    Stroke Neg Hx     Past medical history, surgical history, medications, allergies, family history and social history reviewed with patient today and changes made to appropriate areas of the chart.   Review of Systems - none All other ROS negative except what is listed above and in the HPI.      Objective:    BP 128/80 (BP Location: Left Arm)   Pulse 78   Temp 98.4 F (36.9 C) (Oral)   Ht 5\' 1"  (1.549 m)   Wt 172 lb 9.6 oz (78.3 kg)   SpO2 96%   BMI 32.61 kg/m   Wt Readings from Last 3 Encounters:  01/10/21 172 lb 9.6 oz (78.3 kg)  08/18/20 168  lb 3.2 oz (76.3 kg)  02/16/20 172 lb (78 kg)    Physical Exam Vitals and nursing note reviewed. Exam conducted with a chaperone present.  Constitutional:      General: She is awake. She is not in acute distress.    Appearance: She is well-developed and well-groomed. She is obese. She is not ill-appearing or toxic-appearing.  HENT:     Head: Normocephalic and atraumatic.     Right Ear: Hearing, tympanic membrane, ear canal and external ear normal. No drainage.     Left Ear: Hearing, tympanic membrane, ear canal and external ear normal. No drainage.     Nose: Nose normal.     Right Sinus: No maxillary sinus tenderness or frontal sinus tenderness.     Left Sinus: No maxillary sinus tenderness or frontal sinus tenderness.     Mouth/Throat:     Mouth: Mucous membranes are moist.     Pharynx: Oropharynx is clear. Uvula midline. No pharyngeal swelling, oropharyngeal exudate or posterior oropharyngeal erythema.  Eyes:     General: Lids are normal.        Right eye: No discharge.        Left eye: No discharge.     Extraocular Movements: Extraocular movements intact.     Conjunctiva/sclera: Conjunctivae normal.     Pupils: Pupils are equal, round, and reactive to light.     Visual Fields: Right eye visual fields normal and left eye visual fields normal.  Neck:     Thyroid: No thyromegaly.     Vascular: No carotid bruit.     Trachea: Trachea normal.  Cardiovascular:     Rate and Rhythm: Normal rate and regular rhythm.     Heart sounds: Normal heart sounds. No murmur heard.   No gallop.     Comments: Varicose veins bilateral lower extremity. Pulmonary:  Effort: Pulmonary effort is normal. No accessory muscle usage or respiratory distress.     Breath sounds: Normal breath sounds.  Chest:  Breasts:    Right: Normal. No axillary adenopathy or supraclavicular adenopathy.     Left: Normal. No axillary adenopathy or supraclavicular adenopathy.    Abdominal:     General: Bowel sounds are  normal.     Palpations: Abdomen is soft. There is no hepatomegaly or splenomegaly.     Tenderness: There is no abdominal tenderness.  Genitourinary:    Exam position: Lithotomy position.     Labia:        Right: No rash.        Left: No rash.      Vagina: Normal.     Cervix: Normal.     Rectum: Normal.     Comments: Cervix anterior, viewed, pap sample obtained and sent.  History of partial hysterectomy, no uterus/ovaries. Musculoskeletal:        General: Normal range of motion.     Cervical back: Normal range of motion and neck supple.     Right lower leg: Edema (trace) present.     Left lower leg: Edema (trace) present.  Lymphadenopathy:     Head:     Right side of head: No submental, submandibular, tonsillar, preauricular or posterior auricular adenopathy.     Left side of head: No submental, submandibular, tonsillar, preauricular or posterior auricular adenopathy.     Cervical: No cervical adenopathy.     Upper Body:     Right upper body: No supraclavicular, axillary or pectoral adenopathy.     Left upper body: No supraclavicular, axillary or pectoral adenopathy.  Skin:    General: Skin is warm and dry.     Capillary Refill: Capillary refill takes less than 2 seconds.  Neurological:     Mental Status: She is alert and oriented to person, place, and time.     Cranial Nerves: Cranial nerves are intact.     Gait: Gait is intact.     Deep Tendon Reflexes: Reflexes are normal and symmetric.     Reflex Scores:      Brachioradialis reflexes are 2+ on the right side and 2+ on the left side.      Patellar reflexes are 2+ on the right side and 2+ on the left side. Psychiatric:        Attention and Perception: Attention normal.        Mood and Affect: Mood normal.        Speech: Speech normal.        Behavior: Behavior normal. Behavior is cooperative.        Thought Content: Thought content normal.        Judgment: Judgment normal.   Results for orders placed or performed in visit  on 08/18/20  Comprehensive metabolic panel  Result Value Ref Range   Glucose 104 (H) 65 - 99 mg/dL   BUN 23 8 - 27 mg/dL   Creatinine, Ser 0.88 0.57 - 1.00 mg/dL   GFR calc non Af Amer 72 >59 mL/min/1.73   GFR calc Af Amer 83 >59 mL/min/1.73   BUN/Creatinine Ratio 26 12 - 28   Sodium 144 134 - 144 mmol/L   Potassium 4.0 3.5 - 5.2 mmol/L   Chloride 106 96 - 106 mmol/L   CO2 23 20 - 29 mmol/L   Calcium 9.3 8.7 - 10.3 mg/dL   Total Protein 7.1 6.0 - 8.5 g/dL   Albumin 4.4  3.8 - 4.9 g/dL   Globulin, Total 2.7 1.5 - 4.5 g/dL   Albumin/Globulin Ratio 1.6 1.2 - 2.2   Bilirubin Total 0.3 0.0 - 1.2 mg/dL   Alkaline Phosphatase 120 44 - 121 IU/L   AST 24 0 - 40 IU/L   ALT 23 0 - 32 IU/L  Lipid Panel w/o Chol/HDL Ratio  Result Value Ref Range   Cholesterol, Total 246 (H) 100 - 199 mg/dL   Triglycerides 349 (H) 0 - 149 mg/dL   HDL 39 (L) >39 mg/dL   VLDL Cholesterol Cal 64 (H) 5 - 40 mg/dL   LDL Chol Calc (NIH) 143 (H) 0 - 99 mg/dL  Microalbumin, Urine Waived  Result Value Ref Range   Microalb, Ur Waived 30 (H) 0 - 19 mg/L   Creatinine, Urine Waived 300 10 - 300 mg/dL   Microalb/Creat Ratio <30 <30 mg/g  Gamma GT  Result Value Ref Range   GGT 198 (H) 0 - 60 IU/L  VITAMIN D 25 Hydroxy (Vit-D Deficiency, Fractures)  Result Value Ref Range   Vit D, 25-Hydroxy 24.4 (L) 30.0 - 100.0 ng/mL      Assessment & Plan:   Problem List Items Addressed This Visit       Cardiovascular and Mediastinum   Essential hypertension, benign    Chronic, with BP at goal today on recheck and on home readings.  Recommend she monitor BP at least a few mornings a week at home and document.  DASH diet at home.  Continue current medication regimen and adjust as needed, refills sent in last visit.  Labs today: CMP, TSH.  Return in 6 months.        Relevant Medications   losartan (COZAAR) 100 MG tablet   Other Relevant Orders   CBC with Differential/Platelet   TSH   Varicose vein of leg    Chronic,  ongoing.  No pain.  Recommend use of compression hose on during day and off at night.  May take Tylenol as needed for pain.  Refer to vascular if poor response to simple treatment.       Relevant Medications   losartan (COZAAR) 100 MG tablet     Respiratory   Centrilobular emphysema (HCC)    Chronic, stable with no inhalers.  Will plan on spirometry next visit.  If worsening symptoms initiate inhaler regimen.  Continue yearly lung screening -- recommend she call to schedule this and pamphlet provided.         Musculoskeletal and Integument   Sebaceous cyst of axilla    Stable with no growth reported left axilla.  Continue to monitor and recommend she obtain her mammogram.  Will send to general surgery if changes present.         Other   Dyslipidemia    Chronic, ongoing.  Continue current medication regimen and adjust as needed, monitor LFT as has history of elevation.  Pravastatin being tolerated well with no major increased in LFT noted, in fact LFT improving.  May consider adjusting statin regimen at next visit.       Relevant Orders   Comprehensive metabolic panel   Lipid Panel w/o Chol/HDL Ratio   Smoking history    Recommend she continue yearly lung screening and to schedule this, provided pamphlet with number to call and schedule.       Obesity (BMI 30.0-34.9)    BMI 32.61. Recommended eating smaller high protein, low fat meals more frequently and exercising 30 mins a day 5  times a week with a goal of 10-15lb weight loss in the next 3 months. Patient voiced their understanding and motivation to adhere to these recommendations.         Vitamin D deficiency    Continue supplement and check Vit D level today.       Relevant Orders   VITAMIN D 25 Hydroxy (Vit-D Deficiency, Fractures)   Generalized anxiety disorder    Chronic, stable with Trazodone and continuation of Buspar. Continue current medication regimen and adjust as needed.  Denies SI/HI.  Refills sent in  last visit.  Continue therapy sessions.       Depression, major, single episode, mild (HCC) - Primary    Chronic, ongoing.  Denies SI/HI.  Reports benefit from current regimen, will continue this and send refills as needed.  Continue therapy visits.  Return in 6 months.       Family history of colon cancer in mother    GI referral for colonoscopy.       Relevant Orders   Ambulatory referral to Gastroenterology   Other Visit Diagnoses     Pap smear of vagina with ASC-US       Repeat pap done today and will refer to GYN if ongoing abnormal this check.   Relevant Orders   Cytology - PAP   Need for Td vaccine       Td provided in office today   Relevant Orders   Td vaccine greater than or equal to 7yo preservative free IM   Encounter for screening mammogram for malignant neoplasm of breast       Mammogram ordered and recommend to obtain   Relevant Orders   MM 3D SCREEN BREAST BILATERAL   Colon cancer screening       GI referral in place   Relevant Orders   Ambulatory referral to Gastroenterology   Annual physical exam       Annual labs today and health maintenance reviewed, is agreeable to Td vaccine.        Follow up plan: Return in about 6 months (around 07/13/2021) for HTN/HLD, Vit D, COPD, ANXIETY -- spirometry needed.   LABORATORY TESTING:  - Pap smear: pap done -- history of ASC-US  IMMUNIZATIONS:   - Tdap: Tetanus vaccination status reviewed: ordered today - Influenza: Up to date - Pneumovax: Not applicable - Prevnar: Not applicable - HPV: Not applicable - Zostavax vaccine:  up to date  SCREENING: -Mammogram: Ordered today  - Colonoscopy: Ordered today  - Bone Density: Not applicable  -Hearing Test: Not applicable  -Spirometry: Refused   PATIENT COUNSELING:   Advised to take 1 mg of folate supplement per day if capable of pregnancy.   Sexuality: Discussed sexually transmitted diseases, partner selection, use of condoms, avoidance of unintended  pregnancy  and contraceptive alternatives.   Advised to avoid cigarette smoking.  I discussed with the patient that most people either abstain from alcohol or drink within safe limits (<=14/week and <=4 drinks/occasion for males, <=7/weeks and <= 3 drinks/occasion for females) and that the risk for alcohol disorders and other health effects rises proportionally with the number of drinks per week and how often a drinker exceeds daily limits.  Discussed cessation/primary prevention of drug use and availability of treatment for abuse.   Diet: Encouraged to adjust caloric intake to maintain  or achieve ideal body weight, to reduce intake of dietary saturated fat and total fat, to limit sodium intake by avoiding high sodium foods and not adding  table salt, and to maintain adequate dietary potassium and calcium preferably from fresh fruits, vegetables, and low-fat dairy products.    stressed the importance of regular exercise  Injury prevention: Discussed safety belts, safety helmets, smoke detector, smoking near bedding or upholstery.   Dental health: Discussed importance of regular tooth brushing, flossing, and dental visits.    NEXT PREVENTATIVE PHYSICAL DUE IN 1 YEAR. Return in about 6 months (around 07/13/2021) for HTN/HLD, Vit D, COPD, ANXIETY -- spirometry needed.

## 2021-01-10 NOTE — Assessment & Plan Note (Addendum)
Stable with no growth reported left axilla.  Continue to monitor and recommend she obtain her mammogram.  Will send to general surgery if changes present.

## 2021-01-10 NOTE — Assessment & Plan Note (Signed)
Chronic, with BP at goal today on recheck and on home readings.  Recommend she monitor BP at least a few mornings a week at home and document.  DASH diet at home.  Continue current medication regimen and adjust as needed, refills sent in last visit.  Labs today: CMP, TSH.  Return in 6 months.

## 2021-01-10 NOTE — Assessment & Plan Note (Signed)
GI referral for colonoscopy

## 2021-01-10 NOTE — Assessment & Plan Note (Signed)
Chronic, stable with Trazodone and continuation of Buspar. Continue current medication regimen and adjust as needed.  Denies SI/HI.  Refills sent in last visit.  Continue therapy sessions.

## 2021-01-10 NOTE — Assessment & Plan Note (Signed)
Continue supplement and check Vit D level today.

## 2021-01-10 NOTE — Patient Instructions (Signed)
[facility]  Address: [address], [place], [place] [address]  Phone: [phone]  Mammogram A mammogram is an X-ray of the breasts. This is done to check for changes that are not normal. This test can look for changes that may be caused by breast cancer or other problems. Mammograms are regularly done on women beginning at age 61. A man may have a mammogram if he has a lump or swelling in his breast. Tell a doctor: About any allergies you have. If you have breast implants. If you have had breast disease, biopsy, or surgery. If you have a family history of breast cancer. If you are breastfeeding. Whether you are pregnant or may be pregnant. What are the risks? Generally, this is a safe procedure. But problems may occur, including: Being exposed to radiation. Radiation levels are very low with this test. The need for more tests. The results were not read properly. Trouble finding breast cancer in women with dense breasts. What happens before the test? Have this test done about 1-2 weeks after your menstrual period. This is often when your breasts are the least tender. If you are visiting a new doctor or clinic, have any past mammogram images sent to your new doctor's office. Wash your breasts and under your arms on the day of the test. Do not use deodorants, perfumes, lotions, or powders on the day of the test. Take off any jewelry from your neck. Wear clothes that you can change into and out of easily. What happens during the test?  You will take off your clothes from the waist up. You will put on a gown. You will stand in front of the X-ray machine. Each breast will be placed between two plastic or glass plates. The plates will press down on your breast for a few seconds. Try to relax. This does not cause any harm to your breasts. It may not feel comfortable, but it will be very brief. X-rays will be taken from different angles of each  breast. The procedure may vary among doctors and hospitals. What can I expect after the test? The mammogram will be read by a specialist (radiologist). You may need to do parts of the test again. This depends on the quality of the images. You may go back to your normal activities. It is up to you to get the results of your test. Ask how to get your results when they are ready. Summary A mammogram is an X-ray of the breasts. It looks for changes that may be caused by breast cancer or other problems. A man may have this test if he has a lump or swelling in his breast. Before the test, tell your doctor about any breast problems that you have had in the past. Have this test done about 1-2 weeks after your menstrual period. Ask when your test results will be ready. Make sure you get your test results. This information is not intended to replace advice given to you by your health care provider. Make sure you discuss any questions you have with your health care provider. Document Revised: 04/12/2020 Document Reviewed: 04/12/2020 Elsevier Patient Education  2022 [employer] Inc[employer]   

## 2021-01-11 LAB — VITAMIN D 25 HYDROXY (VIT D DEFICIENCY, FRACTURES): Vit D, 25-Hydroxy: 44.7 ng/mL (ref 30.0–100.0)

## 2021-01-11 LAB — CBC WITH DIFFERENTIAL/PLATELET
Basophils Absolute: 0.1 10*3/uL (ref 0.0–0.2)
Basos: 1 %
EOS (ABSOLUTE): 0.2 10*3/uL (ref 0.0–0.4)
Eos: 2 %
Hematocrit: 47.4 % — ABNORMAL HIGH (ref 34.0–46.6)
Hemoglobin: 16.3 g/dL — ABNORMAL HIGH (ref 11.1–15.9)
Immature Grans (Abs): 0 10*3/uL (ref 0.0–0.1)
Immature Granulocytes: 0 %
Lymphocytes Absolute: 4.2 10*3/uL — ABNORMAL HIGH (ref 0.7–3.1)
Lymphs: 47 %
MCH: 29.1 pg (ref 26.6–33.0)
MCHC: 34.4 g/dL (ref 31.5–35.7)
MCV: 85 fL (ref 79–97)
Monocytes Absolute: 0.8 10*3/uL (ref 0.1–0.9)
Monocytes: 8 %
Neutrophils Absolute: 3.8 10*3/uL (ref 1.4–7.0)
Neutrophils: 42 %
Platelets: 248 10*3/uL (ref 150–450)
RBC: 5.61 x10E6/uL — ABNORMAL HIGH (ref 3.77–5.28)
RDW: 13.1 % (ref 11.7–15.4)
WBC: 9 10*3/uL (ref 3.4–10.8)

## 2021-01-11 LAB — LIPID PANEL W/O CHOL/HDL RATIO
Cholesterol, Total: 240 mg/dL — ABNORMAL HIGH (ref 100–199)
HDL: 33 mg/dL — ABNORMAL LOW (ref >39)
LDL Chol Calc (NIH): 146 mg/dL — ABNORMAL HIGH (ref 0–99)
Triglycerides: 329 mg/dL — ABNORMAL HIGH (ref 0–149)
VLDL Cholesterol Cal: 61 mg/dL — ABNORMAL HIGH (ref 5–40)

## 2021-01-11 LAB — COMPREHENSIVE METABOLIC PANEL
ALT: 35 IU/L — ABNORMAL HIGH (ref 0–32)
AST: 26 IU/L (ref 0–40)
Albumin/Globulin Ratio: 1.7 (ref 1.2–2.2)
Albumin: 4.2 g/dL (ref 3.8–4.8)
Alkaline Phosphatase: 109 IU/L (ref 44–121)
BUN/Creatinine Ratio: 17 (ref 12–28)
BUN: 13 mg/dL (ref 8–27)
Bilirubin Total: 0.4 mg/dL (ref 0.0–1.2)
CO2: 20 mmol/L (ref 20–29)
Calcium: 9.5 mg/dL (ref 8.7–10.3)
Chloride: 106 mmol/L (ref 96–106)
Creatinine, Ser: 0.76 mg/dL (ref 0.57–1.00)
Globulin, Total: 2.5 g/dL (ref 1.5–4.5)
Glucose: 86 mg/dL (ref 65–99)
Potassium: 4.1 mmol/L (ref 3.5–5.2)
Sodium: 141 mmol/L (ref 134–144)
Total Protein: 6.7 g/dL (ref 6.0–8.5)
eGFR: 89 mL/min/{1.73_m2} (ref >59)

## 2021-01-11 LAB — TSH: TSH: 1.51 u[IU]/mL (ref 0.450–4.500)

## 2021-01-11 NOTE — Progress Notes (Signed)
Contacted via [place] morning Allison Pham, your labs have returned: - CBC continues to show some mild elevation in hemoglobin and hematocrit + lymphocytes -- this may be related to nicotine use in past. Recommend continue cessation of smoking and to continue your lung CT scans, please schedule this.  If ongoing elevations or any increase in levels I may send to hematology for further assessment. - Liver function and kidney function remain stable.  Vitamin D level and thyroid normal. - Cholesterol levels remain elevated.  I would like you to continue Pravastatin at this time and would like to add on Zetia to take with this to see if we can lower levels more -- would you be okay with this?  Please let me know.   Keep being awesome!!  Thank you for allowing me to participate in your care.  I appreciate you. Kindest regards, Cleora Karnik

## 2021-01-12 ENCOUNTER — Other Ambulatory Visit: Payer: Self-pay | Admitting: Nurse Practitioner

## 2021-01-12 MED ORDER — EZETIMIBE 10 MG PO TABS: 10.0000 mg | ORAL_TABLET | Freq: Every day | ORAL | 3 refills | Status: DC

## 2021-01-12 NOTE — Progress Notes (Signed)
Zetia addition

## 2021-01-13 ENCOUNTER — Ambulatory Visit (INDEPENDENT_AMBULATORY_CARE_PROVIDER_SITE_OTHER): Payer: Medicare Other | Admitting: Psychology

## 2021-01-13 DIAGNOSIS — F4323 Adjustment disorder with mixed anxiety and depressed mood: Secondary | ICD-10-CM

## 2021-01-14 ENCOUNTER — Other Ambulatory Visit: Payer: Self-pay

## 2021-01-14 LAB — CYTOLOGY - PAP
Comment: NEGATIVE
Diagnosis: NEGATIVE
High risk HPV: NEGATIVE

## 2021-01-14 MED ORDER — CLENPIQ 10-3.5-12 MG-GM -GM/160ML PO SOLN: 320.0000 mL | ORAL | 0 refills | Status: DC

## 2021-01-14 NOTE — Progress Notes (Signed)
Good afternoon, please let Allison Pham know her pap returned negative!!  [facility].  She does not need to repeat for 5 years and at that point since she will be over 32, she does not need to continue paps unless she wishes to.:) Keep being awesome!!  Thank you for allowing me to participate in your care.  I appreciate you. Kindest regards, Chigozie Basaldua

## 2021-01-17 ENCOUNTER — Encounter: Payer: Self-pay | Admitting: Gastroenterology

## 2021-01-19 ENCOUNTER — Telehealth: Payer: Self-pay | Admitting: *Deleted

## 2021-01-19 ENCOUNTER — Ambulatory Visit (INDEPENDENT_AMBULATORY_CARE_PROVIDER_SITE_OTHER): Payer: Medicare Other | Admitting: Nurse Practitioner

## 2021-01-19 ENCOUNTER — Other Ambulatory Visit: Payer: Self-pay

## 2021-01-19 VITALS — BP 172/112

## 2021-01-19 DIAGNOSIS — I10 Essential (primary) hypertension: Secondary | ICD-10-CM

## 2021-01-19 MED ORDER — AMLODIPINE BESYLATE 5 MG PO TABS: 5.0000 mg | ORAL_TABLET | Freq: Every day | ORAL | 0 refills | Status: DC

## 2021-01-19 NOTE — Progress Notes (Signed)
Nurse visit only: BP elevation, patient walked in at 1628 with complaint of BP elevation, refusing ER or UC visit -- BP check on nurse check 172/112.  No symptoms.  Again refuses ER or UC visit.  Will send in Amlodipine 5 MG to start and continue current medications.  Have HIGHLY recommended scheduling visit tomorrow or Friday with a provider in office, PCP on vacation starting Friday.  If symptoms present like facial droop, headache, SOB, CP then immediately go to ER.

## 2021-01-19 NOTE — Telephone Encounter (Signed)
BP elevation, patient walked in at 1628 with complaint of BP elevation, refusing ER or UC visit -- BP check on nurse check 172/112.  No symptoms.  Again refuses ER or UC visit.  Will send in Amlodipine 5 MG to start and continue current medications.  Have HIGHLY recommended scheduling visit tomorrow or Friday with provider in office, PCP on vacation starting Friday.  If symptoms present like facial droop, headache, SOB, CP then immediately go to ER.

## 2021-01-28 ENCOUNTER — Encounter
Admission: RE | Disposition: A | Discharge status: Home / Self Care | Payer: Self-pay | Source: Home / Self Care | Attending: Gastroenterology

## 2021-01-28 ENCOUNTER — Ambulatory Visit
Admission: RE | Admit: 2021-01-28 | Discharge: 2021-01-28 | Disposition: A | Discharge status: Home / Self Care | Payer: Medicare Other | Attending: Gastroenterology | Admitting: Gastroenterology

## 2021-01-28 ENCOUNTER — Ambulatory Visit: Payer: Medicare Other | Admitting: Anesthesiology

## 2021-01-28 ENCOUNTER — Encounter: Payer: Self-pay | Admitting: Gastroenterology

## 2021-01-28 ENCOUNTER — Other Ambulatory Visit: Payer: Self-pay

## 2021-01-28 DIAGNOSIS — K635 Polyp of colon: Secondary | ICD-10-CM | POA: Diagnosis not present

## 2021-01-28 DIAGNOSIS — D124 Benign neoplasm of descending colon: Secondary | ICD-10-CM | POA: Insufficient documentation

## 2021-01-28 DIAGNOSIS — Z880 Allergy status to penicillin: Secondary | ICD-10-CM | POA: Insufficient documentation

## 2021-01-28 DIAGNOSIS — Z860100 Personal history of colon polyps, unspecified: Secondary | ICD-10-CM

## 2021-01-28 DIAGNOSIS — Z803 Family history of malignant neoplasm of breast: Secondary | ICD-10-CM | POA: Diagnosis not present

## 2021-01-28 DIAGNOSIS — Z79899 Other long term (current) drug therapy: Secondary | ICD-10-CM | POA: Diagnosis not present

## 2021-01-28 DIAGNOSIS — Z809 Family history of malignant neoplasm, unspecified: Secondary | ICD-10-CM | POA: Insufficient documentation

## 2021-01-28 DIAGNOSIS — K640 First degree hemorrhoids: Secondary | ICD-10-CM | POA: Diagnosis not present

## 2021-01-28 DIAGNOSIS — Z1211 Encounter for screening for malignant neoplasm of colon: Secondary | ICD-10-CM | POA: Insufficient documentation

## 2021-01-28 DIAGNOSIS — Z87891 Personal history of nicotine dependence: Secondary | ICD-10-CM | POA: Diagnosis not present

## 2021-01-28 DIAGNOSIS — Z825 Family history of asthma and other chronic lower respiratory diseases: Secondary | ICD-10-CM | POA: Insufficient documentation

## 2021-01-28 DIAGNOSIS — Z8249 Family history of ischemic heart disease and other diseases of the circulatory system: Secondary | ICD-10-CM | POA: Insufficient documentation

## 2021-01-28 HISTORY — PX: POLYPECTOMY: SHX5525

## 2021-01-28 HISTORY — PX: COLONOSCOPY: SHX5424

## 2021-01-28 SURGERY — COLONOSCOPY
Anesthesia: General

## 2021-01-28 MED ORDER — SODIUM CHLORIDE 0.9 % IV SOLN: INTRAVENOUS | Status: DC

## 2021-01-28 MED ORDER — PROPOFOL 10 MG/ML IV BOLUS
INTRAVENOUS | Status: DC | PRN
  Administered 2021-01-28 (×3): 30 mg via INTRAVENOUS
  Administered 2021-01-28: 140 mg via INTRAVENOUS
  Administered 2021-01-28: 30 mg via INTRAVENOUS
  Administered 2021-01-28: 40 mg via INTRAVENOUS
  Administered 2021-01-28: 20 mg via INTRAVENOUS

## 2021-01-28 MED ORDER — LACTATED RINGERS IV SOLN: INTRAVENOUS | Status: DC

## 2021-01-28 MED ORDER — LIDOCAINE HCL (CARDIAC) PF 100 MG/5ML IV SOSY
PREFILLED_SYRINGE | INTRAVENOUS | Status: DC | PRN
  Administered 2021-01-28: 30 mg via INTRAVENOUS

## 2021-01-28 MED ORDER — ACETAMINOPHEN 325 MG PO TABS: 325.0000 mg | ORAL_TABLET | ORAL | Status: DC | PRN

## 2021-01-28 MED ORDER — ONDANSETRON HCL 4 MG/2ML IJ SOLN: 4.0000 mg | Freq: Once | INTRAMUSCULAR | Status: DC | PRN

## 2021-01-28 MED ORDER — ACETAMINOPHEN 160 MG/5ML PO SOLN: 325.0000 mg | ORAL | Status: DC | PRN

## 2021-01-28 MED ORDER — STERILE WATER FOR IRRIGATION IR SOLN: Status: DC | PRN

## 2021-01-28 SURGICAL SUPPLY — 22 items
CLIP HMST 235XBRD CATH ROT (MISCELLANEOUS) IMPLANT
CLIP RESOLUTION 360 11X235 (MISCELLANEOUS)
ELECT REM PT RETURN 9FT ADLT (ELECTROSURGICAL)
ELECTRODE REM PT RTRN 9FT ADLT (ELECTROSURGICAL) IMPLANT
FORCEPS BIOP RAD 4 LRG CAP 4 (CUTTING FORCEPS) ×3 IMPLANT
GOWN CVR UNV OPN BCK APRN NK (MISCELLANEOUS) ×4 IMPLANT
GOWN ISOL THUMB LOOP REG UNIV (MISCELLANEOUS) ×6
INJECTOR VARIJECT VIN23 (MISCELLANEOUS) IMPLANT
KIT DEFENDO VALVE AND CONN (KITS) IMPLANT
KIT PRC NS LF DISP ENDO (KITS) ×2 IMPLANT
KIT PROCEDURE OLYMPUS (KITS) ×3
MANIFOLD NEPTUNE II (INSTRUMENTS) ×3 IMPLANT
MARKER SPOT ENDO TATTOO 5ML (MISCELLANEOUS) IMPLANT
PROBE APC STR FIRE (PROBE) IMPLANT
RETRIEVER NET ROTH 2.5X230 LF (MISCELLANEOUS) IMPLANT
SNARE COLD EXACTO (MISCELLANEOUS) IMPLANT
SNARE SHORT THROW 13M SML OVAL (MISCELLANEOUS) IMPLANT
SNARE SNG USE RND 15MM (INSTRUMENTS) IMPLANT
SPOT EX ENDOSCOPIC TATTOO (MISCELLANEOUS)
TRAP ETRAP POLY (MISCELLANEOUS) IMPLANT
VARIJECT INJECTOR VIN23 (MISCELLANEOUS)
WATER STERILE IRR 250ML POUR (IV SOLUTION) ×3 IMPLANT

## 2021-01-28 NOTE — Transfer of Care (Signed)
Immediate Anesthesia Transfer of Care Note  Patient: Allison Pham  Procedure(s) Performed: COLONOSCOPY POLYPECTOMY  Patient Location: PACU  Anesthesia Type: General  Level of Consciousness: awake, alert  and patient cooperative  Airway and Oxygen Therapy: Patient Spontanous Breathing and Patient connected to supplemental oxygen  Post-op Assessment: Post-op Vital signs reviewed, Patient's Cardiovascular Status Stable, Respiratory Function Stable, Patent Airway and No signs of Nausea or vomiting  Post-op Vital Signs: Reviewed and stable  Complications: No notable events documented.

## 2021-01-28 NOTE — Op Note (Signed)
[facility] Gastroenterology Patient Name: Allison Pham Procedure Date: 01/28/2021 8:51 AM MRN: [id number] Account #: [id number] Date of Birth: 05/27/1961 Admit Type: Outpatient Age: 60 Room: [facility] OR ROOM 01 Gender: Female Note Status: Finalized Procedure:             Colonoscopy Indications:           High risk colon cancer surveillance: Personal history                         of colonic polyps Providers:             [name] MD, MD Referring MD:          Barbaraann Faster. Cannady (Referring MD) Medicines:             Propofol per Anesthesia Complications:         No immediate complications. Procedure:             Pre-Anesthesia Assessment:                        - Prior to the procedure, a History and Physical was                         performed, and patient medications and allergies were                         reviewed. The patient's tolerance of previous                         anesthesia was also reviewed. The risks and benefits                         of the procedure and the sedation options and risks                         were discussed with the patient. All questions were                         answered, and informed consent was obtained. Prior                         Anticoagulants: The patient has taken no previous                         anticoagulant or antiplatelet agents. ASA Grade                         Assessment: II - A patient with mild systemic disease.                         After reviewing the risks and benefits, the patient                         was deemed in satisfactory condition to undergo the                         procedure.  After obtaining informed consent, the colonoscope was                         passed under direct vision. Throughout the procedure,                         the patient's blood pressure, pulse, and oxygen                         saturations were monitored continuously. The                          Colonoscope was introduced through the anus and                         advanced to the the cecum, identified by appendiceal                         orifice and ileocecal valve. The colonoscopy was                         performed without difficulty. The patient tolerated                         the procedure well. The quality of the bowel                         preparation was excellent. Findings:      The perianal and digital rectal examinations were normal.      A 3 mm polyp was found in the descending colon. The polyp was sessile.       The polyp was removed with a cold biopsy forceps. Resection and       retrieval were complete.      Non-bleeding internal hemorrhoids were found during retroflexion. The       hemorrhoids were Grade I (internal hemorrhoids that do not prolapse). Impression:            - One 3 mm polyp in the descending colon, removed with                         a cold biopsy forceps. Resected and retrieved.                        - Non-bleeding internal hemorrhoids. Recommendation:        - Discharge patient to home.                        - Resume previous diet.                        - Continue present medications.                        - Await pathology results.                        - Repeat colonoscopy in 7 years for surveillance. Procedure Code(s):     --- Professional ---  45380, Colonoscopy, flexible; with biopsy, single or                         multiple Diagnosis Code(s):     --- Professional ---                        Z86.010, Personal history of colonic polyps                        K63.5, Polyp of colon CPT copyright 2019 American Medical Association. All rights reserved. The codes documented in this report are preliminary and upon coder review may  be revised to meet current compliance requirements. [name] MD, MD 01/28/2021 9:14:45 AM This report has been signed electronically. Number of Addenda: 0 Note  Initiated On: 01/28/2021 8:51 AM Scope Withdrawal Time: 0 hours 8 minutes 23 seconds  Total Procedure Duration: 0 hours 12 minutes 30 seconds  Estimated Blood Loss:  Estimated blood loss: none.      [facility]

## 2021-01-28 NOTE — H&P (Signed)
[name], MD [facility] [address], [place], [place] [address] Phone:[phone] Fax : [phone]  Primary Care Physician:  Venita Lick, NP Primary Gastroenterologist:  Dr. Allen Norris  Pre-Procedure History & Physical: HPI:  Allison Pham is a 60 y.o. female is here for an colonoscopy.   Past Medical History:  Diagnosis Date   Anxiety    Carotid atherosclerosis, bilateral 07/16/2018   rescan Jan 2021   Emphysema of lung ([place]) 03/21/2018   GERD (gastroesophageal reflux disease)    Hypercholesteremia    Hypertension    Tick bite    Vertigo    Wears dentures    partial upper    Past Surgical History:  Procedure Laterality Date   ABDOMINAL HYSTERECTOMY     Partial; bladder suspension   COLONOSCOPY WITH PROPOFOL N/A 05/10/2017   Procedure: COLONOSCOPY WITH PROPOFOL;  Surgeon: [name], MD;  Location: [facility];  Service: Endoscopy;  Laterality: N/A;   HEMORRHOID SURGERY     POLYPECTOMY N/A 05/10/2017   Procedure: POLYPECTOMY;  Surgeon: [name], MD;  Location: [facility];  Service: Endoscopy;  Laterality: N/A;    Prior to Admission medications   Medication Sig Start Date End Date Taking? Authorizing Provider  amLODipine (NORVASC) 5 MG tablet Take 1 tablet (5 mg total) by mouth daily. 01/19/21  Yes Cannady, Henrine Screws T, NP  aspirin 81 MG chewable tablet Chew 81 mg by mouth as needed.    Yes [provider]  busPIRone (BUSPAR) 5 MG tablet Take 0.5-1 tablets (2.5-5 mg total) by mouth 3 (three) times daily as needed. 01/09/20  Yes Cannady, [name] T, NP  Cholecalciferol 1.25 MG (50000 UT) TABS Take 1 tablet by mouth once a week. 08/19/20  Yes Cannady, [name] T, NP  fluticasone (FLONASE) 50 MCG/ACT nasal spray Place 1 spray into both nostrils 2 (two) times daily as needed. 12/08/19  Yes Cannady, [name] T, NP  loratadine (CLARITIN) 10 MG tablet Take 1 tablet (10 mg total) by mouth daily as needed for allergies. 10/17/18  Yes Lada, Satira Anis,  MD  losartan (COZAAR) 100 MG tablet Take 1 tablet (100 mg total) by mouth daily. 01/10/21  Yes Cannady, [name] T, NP  pravastatin (PRAVACHOL) 40 MG tablet Take 1 tablet (40 mg total) by mouth daily. 08/18/20  Yes Cannady, [name] T, NP  cyclobenzaprine (FLEXERIL) 5 MG tablet Take 1 tablet (5 mg total) by mouth at bedtime as needed for muscle spasms. Do not drive while taking as can cause drowsiness. Patient not taking: Reported on 01/17/2021 07/26/16   Marylene Land, NP  ezetimibe (ZETIA) 10 MG tablet Take 1 tablet (10 mg total) by mouth daily. Patient not taking: Reported on 01/17/2021 01/12/21   Marnee Guarneri T, NP  metoprolol succinate (TOPROL-XL) 25 MG 24 hr tablet Take 1 tablet (25 mg total) by mouth daily. Patient not taking: Reported on 01/28/2021 08/18/20   Marnee Guarneri T, NP  Sod Picosulfate-Mag Ox-Cit Acd (CLENPIQ) 10-3.5-12 MG-GM -GM/160ML SOLN Take 320 mLs by mouth as directed. 01/14/21   [name], MD  traZODone (DESYREL) 50 MG tablet Take 0.5 tablets (25 mg total) by mouth at bedtime. Patient not taking: Reported on 01/17/2021 08/18/20   Marnee Guarneri T, NP    Allergies as of 01/12/2021 - Review Complete 01/10/2021  Allergen Reaction Noted   Penicillins Rash 01/07/2015    Family History  Problem Relation Age of Onset   Cancer Mother        Colon   Hypertension  Mother    Hypertension Father    Heart attack Father    Heart disease Father    Hypertension Sister    COPD Sister    Hypertension Brother    Heart disease Brother    Cancer Maternal Aunt    Breast cancer Maternal Aunt    Cancer Maternal Uncle    Breast cancer Maternal Aunt    Diabetes Neg Hx    Stroke Neg Hx     Social History   Socioeconomic History   Marital status: Single    Spouse name: Not on file   Number of children: Not on file   Years of education: Not on file   Highest education level: Not on file  Occupational History   Not on file  Tobacco Use   Smoking status: Former    Packs/day:  1.00    Years: 38.00    Pack years: 38.00    Types: Cigarettes    Quit date: 09/13/2015    Years since quitting: 5.3   Smokeless tobacco: Never  Vaping Use   Vaping Use: Some days  Substance and Sexual Activity   Alcohol use: Never    Comment: rare - holidays   Drug use: No   Sexual activity: Not Currently    Birth control/protection: Surgical  Other Topics Concern   Not on file  Social History Narrative   Not on file   Social Determinants of Health   Financial Resource Strain: Not on file  Food Insecurity: Not on file  Transportation Needs: Not on file  Physical Activity: Not on file  Stress: Not on file  Social Connections: Not on file  Intimate Partner Violence: Not on file    Review of Systems: See HPI, otherwise negative ROS  Physical Exam: BP (!) 146/96   Pulse 76   Temp (!) 97.3 F (36.3 C) (Temporal)   Resp 16   Ht 5' (1.524 m)   Wt 77.6 kg   SpO2 100%   BMI 33.40 kg/m  General:   Alert,  pleasant and cooperative in NAD Head:  Normocephalic and atraumatic. Neck:  Supple; no masses or thyromegaly. Lungs:  Clear throughout to auscultation.    Heart:  Regular rate and rhythm. Abdomen:  Soft, nontender and nondistended. Normal bowel sounds, without guarding, and without rebound.   Neurologic:  Alert and  oriented x4;  grossly normal neurologically.  Impression/Plan: Allison Pham is here for an colonoscopy to be performed for a history of adenomatous polyps on 2017   Risks, benefits, limitations, and alternatives regarding  colonoscopy have been reviewed with the patient.  Questions have been answered.  All parties agreeable.   [name], MD  01/28/2021, 8:21 AM

## 2021-01-28 NOTE — Anesthesia Preprocedure Evaluation (Signed)
Anesthesia Evaluation  Patient identified by MRN, date of birth, ID band Patient awake    Reviewed: Allergy & Precautions, NPO status   Airway Mallampati: II  TM Distance: >3 FB     Dental   Pulmonary COPD, former smoker (quit 2017),    Pulmonary exam normal        Cardiovascular hypertension,  Rhythm:Regular Rate:Normal  HLD  Carotid stenosis, bilateral (2020)   Neuro/Psych PSYCHIATRIC DISORDERS Anxiety Depression    GI/Hepatic GERD  ,  Endo/Other  Obesity - BMI>30  Renal/GU      Musculoskeletal  (+) Arthritis ,   Abdominal   Peds  Hematology   Anesthesia Other Findings   Reproductive/Obstetrics                             Anesthesia Physical Anesthesia Plan  ASA: 3  Anesthesia Plan: General   Post-op Pain Management:    Induction: Intravenous  PONV Risk Score and Plan: Propofol infusion, TIVA and Treatment may vary due to age or medical condition  Airway Management Planned: Natural Airway and Nasal Cannula  Additional Equipment:   Intra-op Plan:   Post-operative Plan:   Informed Consent: I have reviewed the patients History and Physical, chart, labs and discussed the procedure including the risks, benefits and alternatives for the proposed anesthesia with the patient or authorized representative who has indicated his/her understanding and acceptance.       Plan Discussed with: CRNA  Anesthesia Plan Comments:         Anesthesia Quick Evaluation

## 2021-01-28 NOTE — Anesthesia Procedure Notes (Signed)
Date/Time: 01/28/2021 8:53 AM Performed by: Cameron Ali, CRNA Pre-anesthesia Checklist: Patient identified, Emergency Drugs available, Suction available, Timeout performed and Patient being monitored Patient Re-evaluated:Patient Re-evaluated prior to induction Oxygen Delivery Method: Nasal cannula Placement Confirmation: positive ETCO2

## 2021-01-28 NOTE — Anesthesia Postprocedure Evaluation (Signed)
Anesthesia Post Note  Patient: Allison Pham  Procedure(s) Performed: COLONOSCOPY POLYPECTOMY     Patient location during evaluation: PACU Anesthesia Type: General Level of consciousness: awake Pain management: pain level controlled Vital Signs Assessment: post-procedure vital signs reviewed and stable Respiratory status: respiratory function stable Cardiovascular status: stable Postop Assessment: no signs of nausea or vomiting Anesthetic complications: no   No notable events documented.  Veda Canning

## 2021-01-31 ENCOUNTER — Encounter: Payer: Self-pay | Admitting: Gastroenterology

## 2021-02-01 LAB — SURGICAL PATHOLOGY

## 2021-02-02 ENCOUNTER — Encounter: Payer: Self-pay | Admitting: Gastroenterology

## 2021-02-03 ENCOUNTER — Ambulatory Visit: Payer: Self-pay | Admitting: *Deleted

## 2021-02-03 NOTE — Telephone Encounter (Signed)
Patient verifying which antihypertensives she should be taking. Systolic B/P [id number] last few times checked.  Per 01/19/21 chart note- Take Amlodipine 5 mg tab QD Per 01/10/21 OV note Take Losartan 100 mg daily These are the blood pressure medications she is taking now. Pharmacy told her not to take Metoprolol 25 mg until she verified with pcp. Routing to pcp for verification- Okay to leave a VM on her phone as to if she should take Metoprolol.     Reason for Disposition  [1] Caller has URGENT medicine question about med that PCP or specialist prescribed AND [2] triager unable to answer question  Answer Assessment - Initial Assessment Questions 1. NAME of MEDICATION: "What medicine are you calling about?"     Metoprolol  2. QUESTION: "What is your question?" (e.g., double dose of medicine, side effect)     Verifying she should not be taking this medication 3. PRESCRIBING HCP: "Who prescribed it?" Reason: if prescribed by specialist, call should be referred to that group.     Kerry Kass, NP 4. SYMPTOMS: "Do you have any symptoms?"     no 5. SEVERITY: If symptoms are present, ask "Are they mild, moderate or severe?"     na 6. PREGNANCY:  "Is there any chance that you are pregnant?" "When was your last menstrual period?"     na  Protocols used: Medication Question Call-A-AH

## 2021-02-04 NOTE — Telephone Encounter (Signed)
Patient notified. Appt scheduled.

## 2021-02-11 ENCOUNTER — Ambulatory Visit (INDEPENDENT_AMBULATORY_CARE_PROVIDER_SITE_OTHER): Payer: Medicare Other | Admitting: Psychology

## 2021-02-11 DIAGNOSIS — F4323 Adjustment disorder with mixed anxiety and depressed mood: Secondary | ICD-10-CM

## 2021-02-21 ENCOUNTER — Ambulatory Visit (INDEPENDENT_AMBULATORY_CARE_PROVIDER_SITE_OTHER): Payer: Medicare Other | Admitting: Psychology

## 2021-02-21 DIAGNOSIS — F4323 Adjustment disorder with mixed anxiety and depressed mood: Secondary | ICD-10-CM | POA: Diagnosis not present

## 2021-02-23 ENCOUNTER — Other Ambulatory Visit: Payer: Self-pay

## 2021-02-23 ENCOUNTER — Encounter: Payer: Self-pay | Admitting: Nurse Practitioner

## 2021-02-23 ENCOUNTER — Ambulatory Visit (INDEPENDENT_AMBULATORY_CARE_PROVIDER_SITE_OTHER): Payer: Medicare Other | Admitting: Nurse Practitioner

## 2021-02-23 VITALS — BP 118/74 | HR 76 | Temp 98.2°F | Wt 173.0 lb

## 2021-02-23 DIAGNOSIS — I10 Essential (primary) hypertension: Secondary | ICD-10-CM

## 2021-02-23 DIAGNOSIS — E785 Hyperlipidemia, unspecified: Secondary | ICD-10-CM | POA: Diagnosis not present

## 2021-02-23 NOTE — Patient Instructions (Signed)

## 2021-02-23 NOTE — Assessment & Plan Note (Signed)
Chronic, ongoing.  Continue current medication regimen and adjust as needed, monitor LFT as has history of elevation.  Recommend she take both Zetia and Pravastatin.  May consider adjusting statin regimen if ongoing elevations due to family history, could consider Praluent or Repatha. 

## 2021-02-23 NOTE — Progress Notes (Signed)
BP 118/74 (BP Location: Left Arm, Patient Position: Sitting, Cuff Size: Normal)   Pulse 76   Temp 98.2 F (36.8 C) (Oral)   Wt 173 lb (78.5 kg)   SpO2 97%   BMI 33.79 kg/m    Subjective:    Patient ID: Allison Pham, female    DOB: 07-16-1960, 60 y.o.   MRN: [id number]  HPI: Allison Pham is a 60 y.o. female  Chief Complaint  Patient presents with   Medication Management    Patient states her blood pressure readings have been good with her current medications. Patient would like to discuss Cholesterol medication as she is confused as if she is supposed to take Zetia or Pravastatin.    HYPERTENSION / HYPERLIPIDEMIA Currently taking Amlodipine 5 MG and Losartan 100 MG daily.  Not taking Metoprolol.  Taking Zetia 10 MG and not taking Pravastatin.  Has started walking and exercising in the house with goal of 10-20 pound weight loss. Satisfied with current treatment? yes Duration of hypertension: chronic BP monitoring frequency: daily BP range: 120/70-80 range BP medication side effects: no Past BP meds: multiple kinds Duration of hyperlipidemia: chronic Cholesterol medication side effects: no Cholesterol supplements: none Past cholesterol medications: multiple different kinds Medication compliance: good compliance Aspirin: no Recent stressors: no Recurrent headaches: no Visual changes: no Palpitations: no Dyspnea: no Chest pain: no Lower extremity edema: no Dizzy/lightheaded: no   Relevant past medical, surgical, family and social history reviewed and updated as indicated. Interim medical history since our last visit reviewed. Allergies and medications reviewed and updated.  Review of Systems  Constitutional:  Negative for activity change, appetite change, diaphoresis, fatigue and fever.  Respiratory:  Negative for cough, chest tightness and shortness of breath.   Cardiovascular:  Negative for chest pain, palpitations and leg swelling.  Gastrointestinal:  Negative.   Neurological: Negative.   Psychiatric/Behavioral: Negative.     Per HPI unless specifically indicated above     Objective:    BP 118/74 (BP Location: Left Arm, Patient Position: Sitting, Cuff Size: Normal)   Pulse 76   Temp 98.2 F (36.8 C) (Oral)   Wt 173 lb (78.5 kg)   SpO2 97%   BMI 33.79 kg/m   Wt Readings from Last 3 Encounters:  02/23/21 173 lb (78.5 kg)  01/28/21 171 lb (77.6 kg)  01/10/21 172 lb 9.6 oz (78.3 kg)    Physical Exam Vitals and nursing note reviewed.  Constitutional:      General: She is awake. She is not in acute distress.    Appearance: She is well-developed and overweight. She is not ill-appearing.  HENT:     Head: Normocephalic.     Right Ear: Hearing normal.     Left Ear: Hearing normal.  Eyes:     General: Lids are normal.        Right eye: No discharge.        Left eye: No discharge.     Conjunctiva/sclera: Conjunctivae normal.     Pupils: Pupils are equal, round, and reactive to light.  Neck:     Vascular: No carotid bruit.  Cardiovascular:     Rate and Rhythm: Normal rate and regular rhythm.     Heart sounds: Normal heart sounds. No murmur heard.   No gallop.  Pulmonary:     Effort: Pulmonary effort is normal. No accessory muscle usage or respiratory distress.     Breath sounds: Normal breath sounds.  Abdominal:     General: Bowel  sounds are normal.     Palpations: Abdomen is soft.  Musculoskeletal:     Cervical back: Normal range of motion and neck supple.     Right lower leg: No edema.     Left lower leg: No edema.  Skin:    General: Skin is warm and dry.  Neurological:     Mental Status: She is alert and oriented to person, place, and time.  Psychiatric:        Attention and Perception: Attention normal.        Mood and Affect: Mood normal.        Speech: Speech normal.        Behavior: Behavior normal. Behavior is cooperative.        Thought Content: Thought content normal.    Results for orders placed or  performed during the hospital encounter of 01/28/21  Surgical pathology  Result Value Ref Range   SURGICAL PATHOLOGY      SURGICAL PATHOLOGY CASE: [id number] PATIENT: Tory Emerald Surgical Pathology Report     Specimen Submitted: A. Colon polyp, descending; cold biopsy  Clinical History: History of colon polyps. Finding: Colon polyp     DIAGNOSIS: A.  COLON POLYP, DESCENDING; COLD BIOPSY: - TUBULAR ADENOMA. - NEGATIVE FOR HIGH-GRADE DYSPLASIA AND MALIGNANCY.  GROSS DESCRIPTION: A. Labeled: Descending colon polyp CF Received: In formalin Collection time: 9:09 AM on 01/28/2021 Placed into formalin time: 9:09 AM on 01/28/2021 Tissue fragment(s): 1 Size: 0.4 x 0.2 x 0.1 cm Description: Received is a single fragment of yellow-tan soft tissue. Entirely submitted in cassette 1.  BAS 01/31/2021   Final Diagnosis performed by Bryan Lemma, MD.   Electronically signed 02/01/2021 5:41:42PM The electronic signature indicates that the named Attending Pathologist has evaluated the specimen Technical component performed at [facility], [address] [place], [place] [address] Lab: [phone] D ir: Rush Farmer, MD, MMM  Professional component performed at [facility], [facility], [place], [place],  [address] Lab: [phone] Dir: Dellia Nims. Reuel Derby, MD       Assessment & Plan:   Problem List Items Addressed This Visit       Cardiovascular and Mediastinum   Essential hypertension, benign - Primary    Chronic, with BP at goal today and on home readings.  Recommend she monitor BP at least a few mornings a week at home and document.  DASH diet at home.  Continue current medication regimen and adjust as needed, will maintain off Metoprolol as is feeling better with Amlodipine and Losartan only.  Labs today: BMP.  Return as scheduled in January.       Relevant Orders   Basic metabolic panel     Other   Dyslipidemia    Chronic, ongoing.  Continue  current medication regimen and adjust as needed, monitor LFT as has history of elevation.  Recommend she take both Zetia and Pravastatin.  May consider adjusting statin regimen if ongoing elevations due to family history, could consider Praluent or Repatha.        Follow up plan: Return for as scheduled 07/13/21.

## 2021-02-23 NOTE — Assessment & Plan Note (Addendum)
Chronic, with BP at goal today and on home readings.  Recommend she monitor BP at least a few mornings a week at home and document.  DASH diet at home.  Continue current medication regimen and adjust as needed, will maintain off Metoprolol as is feeling better with Amlodipine and Losartan only.  Labs today: BMP.  Return as scheduled in January.

## 2021-02-24 LAB — BASIC METABOLIC PANEL
BUN/Creatinine Ratio: 21 (ref 12–28)
BUN: 19 mg/dL (ref 8–27)
CO2: 25 mmol/L (ref 20–29)
Calcium: 9.8 mg/dL (ref 8.7–10.3)
Chloride: 104 mmol/L (ref 96–106)
Creatinine, Ser: 0.9 mg/dL (ref 0.57–1.00)
Glucose: 87 mg/dL (ref 65–99)
Potassium: 4.2 mmol/L (ref 3.5–5.2)
Sodium: 143 mmol/L (ref 134–144)
eGFR: 73 mL/min/{1.73_m2} (ref >59)

## 2021-02-24 NOTE — Progress Notes (Signed)
Contacted via [place] morning [place], your labs have returned and all looks nice and normal.  Continue all current medications.  If any questions let me know.   Keep being incredible!!  Thank you for allowing me to participate in your care.  I appreciate you. Kindest regards, Glynn Freas

## 2021-03-03 ENCOUNTER — Ambulatory Visit
Admission: RE | Admit: 2021-03-03 | Discharge: 2021-03-03 | Disposition: A | Discharge status: Home / Self Care | Payer: Medicare Other | Source: Ambulatory Visit | Attending: Nurse Practitioner | Admitting: Nurse Practitioner

## 2021-03-03 ENCOUNTER — Other Ambulatory Visit: Payer: Self-pay

## 2021-03-03 DIAGNOSIS — Z1231 Encounter for screening mammogram for malignant neoplasm of breast: Secondary | ICD-10-CM | POA: Insufficient documentation

## 2021-03-04 NOTE — Progress Notes (Signed)
Contacted via [place] is normal!!  Woohoo!!

## 2021-03-15 ENCOUNTER — Telehealth: Payer: Self-pay | Admitting: Nurse Practitioner

## 2021-03-15 NOTE — Telephone Encounter (Signed)
Copied from [place] [id number]. Topic: Medicare AWV >> Mar 15, 2021 11:05 AM Lavonia Drafts wrote: Reason for CRM:  Left message for patient to call back and schedule Medicare Annual Wellness Visit (AWV) to be done virtually or by telephone.  No hx of AWV eligible as of 05/12/20  Please schedule at anytime with [facility]-Nurse Health Advisor.      58 Minutes appointment   Any questions, please call me at [phone]

## 2021-03-29 ENCOUNTER — Ambulatory Visit (INDEPENDENT_AMBULATORY_CARE_PROVIDER_SITE_OTHER): Payer: Medicare Other | Admitting: Psychology

## 2021-03-29 DIAGNOSIS — F4323 Adjustment disorder with mixed anxiety and depressed mood: Secondary | ICD-10-CM | POA: Diagnosis not present

## 2021-04-18 ENCOUNTER — Other Ambulatory Visit: Payer: Self-pay | Admitting: Nurse Practitioner

## 2021-04-18 NOTE — Telephone Encounter (Signed)
Medication Refill - Medication: amLODipine (NORVASC) 5 MG tablet   Has the patient contacted their pharmacy? Yes.   (Agent: If no, request that the patient contact the pharmacy for the refill. If patient does not wish to contact the pharmacy document the reason why and proceed with request.) (Agent: If yes, when and what did the pharmacy advise?) Pharmacy advised Pt to call PCP office for refill   Preferred Pharmacy (with phone number or street name): [place], [place] - [address], [place] [address]  Phone:  [phone]  Fax:  [phone]  Has the patient been seen for an appointment in the last year OR does the patient have an upcoming appointment? Yes.  Last OV 8.31.22  Agent: Please be advised that RX refills may take up to 3 business days. We ask that you follow-up with your pharmacy.

## 2021-04-18 NOTE — Telephone Encounter (Signed)
Requested Prescriptions  Pending Prescriptions Disp Refills  . amLODipine (NORVASC) 5 MG tablet [Pharmacy Med Name: AMLODIPINE BESYLATE 5 MG TAB] 90 tablet 1    Sig: Take 1 tablet (5 mg total) by mouth daily.     Cardiovascular:  Calcium Channel Blockers Passed - 04/18/2021 11:33 AM      Passed - Last BP in normal range    BP Readings from Last 1 Encounters:  02/23/21 118/74         Passed - Valid encounter within last 6 months    Recent Outpatient Visits          1 month ago Essential hypertension, benign   [place], [place] T, NP   3 months ago Depression, major, single episode, mild ([place])   [place] Cannady, [name] T, NP   8 months ago Centrilobular emphysema ([place])   [place], Barbaraann Faster, NP   1 year ago Centrilobular emphysema ([place])   [place], Barbaraann Faster, NP   1 year ago Routine general medical examination at a health care facility   [facility], Barbaraann Faster, NP      Future Appointments            In 2 months Cannady, Barbaraann Faster, NP [facility]

## 2021-04-29 ENCOUNTER — Ambulatory Visit (INDEPENDENT_AMBULATORY_CARE_PROVIDER_SITE_OTHER): Payer: Medicare Other | Admitting: Psychology

## 2021-04-29 DIAGNOSIS — F4323 Adjustment disorder with mixed anxiety and depressed mood: Secondary | ICD-10-CM | POA: Diagnosis not present

## 2021-05-10 ENCOUNTER — Ambulatory Visit: Payer: Self-pay | Admitting: Psychology

## 2021-06-06 ENCOUNTER — Ambulatory Visit (INDEPENDENT_AMBULATORY_CARE_PROVIDER_SITE_OTHER): Payer: Medicare Other | Admitting: Psychology

## 2021-06-06 DIAGNOSIS — F3289 Other specified depressive episodes: Secondary | ICD-10-CM | POA: Diagnosis not present

## 2021-06-06 DIAGNOSIS — F320 Major depressive disorder, single episode, mild: Secondary | ICD-10-CM

## 2021-06-06 NOTE — Progress Notes (Signed)
[place] Counselor/Therapist Progress Note  Patient ID: Allison Pham, MRN: [id number]    Date: 06/06/21  Time Spent: 1:38  pm - 2:26 pm : 48 Minutes  Treatment Type: Individual Therapy.  Reported Symptoms: Frustration, anxiety.  Mental Status Exam: Appearance:  NA     Behavior: Appropriate  Motor: N/A  Speech/Language:  Clear and Coherent and Normal Rate  Affect: Appropriate  Mood: dysthymic  Thought process: normal  Thought content:   WNL  Sensory/Perceptual disturbances:   WNL  Orientation: oriented to person, place, time/date, and situation  Attention: Good  Concentration: Good  Memory: [place] of knowledge:  Good  Insight:   Good  Judgment:  Good  Impulse Control: Good   Risk Assessment: Danger to Self:  No Self-injurious Behavior: No Danger to Others: No Duty to Warn:no Physical Aggression / Violence:No  Access to Firearms a concern: No  Gang Involvement:No   Subjective:   Allison Pham participated from home, via phone, and consented to treatment. Therapist participated from home office. We met online due to Allison Pham pandemic. Allison Pham reviewed the events of the past week including interpersonal stressors and difficulty with adjustments.  She discussed her sister's recent move into her home, temporarily, as arrangements are made for her longer term solution.  She noted her frustration regarding her sister's inability to complete tasks on her own and be wholly dependent on others in a consistent manner.  During noted conflicting feelings of needing to be helpful along with hoping her sister would be self-sufficient and that the visit would be temporary.  Allison Pham added that her family as a whole are typically hands-off with her sister which required Allison Pham to be in charge of making arrangements & preparing meals etc, which often results in frustration for Allison Pham. Additional transitions include her daughter recently moving back home after a short stay with her  boyfriend and noted the stress her daughter was experiencing as a result.  She noted this arrangement also being temporary.  She noted her frustration regarding these interpersonal situations causing delays for her own plans.  We processed this during the session and identified ways to set small goals in the meantime that she needs the larger goals.  Therapist encouraged Allison Pham to identify boundaries going forward with her daughter and her sister and family as a whole.  Finally, therapist encouraged Allison Pham to engage in self-care, work towards her stated goals,  and continue her efforts to manage her mood.  Allison Pham was engaged and motivated during the session and expressed her commitment towards her goals.  Therapist validated and normalized Allison Pham's feelings and provided supportive therapy.   Interventions: Assertiveness/Communication and Interpersonal  Diagnosis: Depression, major, single episode, mild ([place]) (by record)   Plan: Patient is to use CBT, BA, Problem Solving, mindfulness and coping skills to help manage decrease symptoms associated with their diagnosis.   Related Problem: Develop healthy interpersonal relationships that lead to the alleviation and help prevent the relapse of depression. Description: Verbalize an accurate understanding of depression. Target Date: 2021-10-12 Progress: 0%  Related Problem: Develop healthy interpersonal relationships that lead to the alleviation and help prevent the relapse of depression. Description: Identify and replace thoughts and beliefs that support depression. Target Date: 2021-10-12 Progress: 0%  Related Problem: Develop healthy interpersonal relationships that lead to the alleviation and help prevent the relapse of depression. Description: Identify important people in life, past and present, and describe the quality, good and poor, of those relationships. Target Date: 2021-10-12 Progress:  0%  Related Problem: Develop healthy interpersonal  relationships that lead to the alleviation and help prevent the relapse of depression. Description: Learn and implement problem-solving and decision-making skills. Target Date: 2021-10-12 Progress: 0%  Related Problem: Develop healthy interpersonal relationships that lead to the alleviation and help prevent the relapse of depression. Description: Learn and implement relapse prevention skills. Target Date: 2021-10-12 Progress: 0%  Related Problem: Enhance ability to effectively cope with the full variety of life's worries and anxieties. Description: Cooperate with a medication evaluation by a physician. Target Date: 2021-10-12 Progress: 0%  Related Problem: Enhance ability to effectively cope with the full variety of life's worries and anxieties. Description: Identify and engage in pleasant activities on a daily basis. Target Date: 2021-10-12 Progress: 10%  Related Problem: Enhance ability to effectively cope with the full variety of life's worries and anxieties. Description: Learn and implement calming skills to reduce overall anxiety and manage anxiety symptoms. Target Date: 2021-10-12 Progress: 0%  Related Problem: Enhance ability to effectively cope with the full variety of life's worries and anxieties. Description: Learn and implement a strategy to limit the association between various environmental settings and worry, delaying the worry until a designated "worry time." Target Date: 2021-10-12  Progress: 0%  Related Problem: Enhance ability to effectively cope with the full variety of life's worries and anxieties. Description: Identify, challenge, and replace biased, fearful self-talk with positive, realistic, and empowering self-talk. Target Date: 2021-10-12 Progress: 0%  Related Problem: Enhance ability to effectively cope with the full variety of life's worries and anxieties. Description: Maintain involvement in work, family, and social activities. Target Date:  2021-10-12 Progress: 0%   [place] Irish, LCSW

## 2021-06-15 ENCOUNTER — Other Ambulatory Visit: Payer: Self-pay | Admitting: Nurse Practitioner

## 2021-06-15 NOTE — Telephone Encounter (Signed)
Requested medication (s) are due for refill today: yes  Requested medication (s) are on the active medication list: yes  Last refill:  08/19/20 #8/2RF  Future visit scheduled: yes  Notes to clinic:  Unable to refill per protocol, cannot delegate.      Requested Prescriptions  Pending Prescriptions Disp Refills   Vitamin D, Ergocalciferol, (DRISDOL) 1.25 MG (50000 UNIT) CAPS capsule [Pharmacy Med Name: VITAMIN D2 1.25MG (50,000 UNIT)] 8 capsule 0    Sig: TAKE 1 CAPSULE WEEKLY.     Endocrinology:  Vitamins - Vitamin D Supplementation Failed - 06/15/2021  1:04 PM      Failed - 50,000 IU strengths are not delegated      Failed - Phosphate in normal range and within 360 days    No results found for: PHOS        Passed - Ca in normal range and within 360 days    Calcium  Date Value Ref Range Status  02/23/2021 9.8 8.7 - 10.3 mg/dL Final          Passed - Vitamin D in normal range and within 360 days    Vit D, 25-Hydroxy  Date Value Ref Range Status  01/10/2021 44.7 30.0 - 100.0 ng/mL Final    Comment:    Vitamin D deficiency has been defined by the Institute of Medicine and an Endocrine Society practice guideline as a level of serum 25-OH vitamin D less than 20 ng/mL (1,2). The Endocrine Society went on to further define vitamin D insufficiency as a level between 21 and 29 ng/mL (2). 1. IOM (Institute of Medicine). 2010. Dietary reference    intakes for calcium and D. [place]: The    [employer]. 2. Holick MF, Binkley [place], Bischoff-Ferrari HA, et al.    Evaluation, treatment, and prevention of vitamin D    deficiency: an Endocrine Society clinical practice    guideline. JCEM. 2011 Jul; 96(7):1911-30.           Passed - Valid encounter within last 12 months    Recent Outpatient Visits           3 months ago Essential hypertension, benign   [place], [place] T, NP   5 months ago Depression, major, single episode, mild ([place])    [place] Cannady, [name] T, NP   10 months ago Centrilobular emphysema ([place])   [place], Barbaraann Faster, NP   1 year ago Centrilobular emphysema ([place])   [place], Barbaraann Faster, NP   1 year ago Routine general medical examination at a health care facility   [facility], Barbaraann Faster, NP       Future Appointments             In 4 weeks Cannady, Barbaraann Faster, NP [facility] [facility]

## 2021-07-06 ENCOUNTER — Encounter: Payer: Medicare Other | Admitting: Psychology

## 2021-07-06 NOTE — Progress Notes (Signed)
This encounter was created in error - please disregard.

## 2021-07-10 NOTE — Patient Instructions (Signed)

## 2021-07-13 ENCOUNTER — Other Ambulatory Visit: Payer: Self-pay

## 2021-07-13 ENCOUNTER — Encounter: Payer: Self-pay | Admitting: Nurse Practitioner

## 2021-07-13 ENCOUNTER — Ambulatory Visit (INDEPENDENT_AMBULATORY_CARE_PROVIDER_SITE_OTHER): Payer: Medicare Other | Admitting: Nurse Practitioner

## 2021-07-13 VITALS — BP 109/72 | HR 89 | Temp 98.1°F | Ht 60.0 in | Wt 175.0 lb

## 2021-07-13 DIAGNOSIS — N760 Acute vaginitis: Secondary | ICD-10-CM | POA: Insufficient documentation

## 2021-07-13 DIAGNOSIS — E785 Hyperlipidemia, unspecified: Secondary | ICD-10-CM

## 2021-07-13 DIAGNOSIS — J432 Centrilobular emphysema: Secondary | ICD-10-CM

## 2021-07-13 DIAGNOSIS — F411 Generalized anxiety disorder: Secondary | ICD-10-CM

## 2021-07-13 DIAGNOSIS — Z87891 Personal history of nicotine dependence: Secondary | ICD-10-CM

## 2021-07-13 DIAGNOSIS — I6523 Occlusion and stenosis of bilateral carotid arteries: Secondary | ICD-10-CM

## 2021-07-13 DIAGNOSIS — F320 Major depressive disorder, single episode, mild: Secondary | ICD-10-CM | POA: Diagnosis not present

## 2021-07-13 DIAGNOSIS — R748 Abnormal levels of other serum enzymes: Secondary | ICD-10-CM

## 2021-07-13 DIAGNOSIS — I10 Essential (primary) hypertension: Secondary | ICD-10-CM | POA: Diagnosis not present

## 2021-07-13 DIAGNOSIS — E669 Obesity, unspecified: Secondary | ICD-10-CM

## 2021-07-13 DIAGNOSIS — B9689 Other specified bacterial agents as the cause of diseases classified elsewhere: Secondary | ICD-10-CM | POA: Insufficient documentation

## 2021-07-13 DIAGNOSIS — E559 Vitamin D deficiency, unspecified: Secondary | ICD-10-CM

## 2021-07-13 LAB — URINALYSIS, ROUTINE W REFLEX MICROSCOPIC
Bilirubin, UA: NEGATIVE
Glucose, UA: NEGATIVE
Ketones, UA: NEGATIVE
Leukocytes,UA: NEGATIVE
Nitrite, UA: NEGATIVE
Protein,UA: NEGATIVE
RBC, UA: NEGATIVE
Specific Gravity, UA: 1.02 (ref 1.005–1.030)
Urobilinogen, Ur: 0.2 mg/dL (ref 0.2–1.0)
pH, UA: 5.5 (ref 5.0–7.5)

## 2021-07-13 LAB — WET PREP FOR TRICH, YEAST, CLUE
Clue Cell Exam: POSITIVE — AB
Trichomonas Exam: NEGATIVE
Yeast Exam: NEGATIVE

## 2021-07-13 MED ORDER — AMLODIPINE BESYLATE 5 MG PO TABS: 5.0000 mg | ORAL_TABLET | Freq: Every day | ORAL | 4 refills | Status: DC

## 2021-07-13 MED ORDER — EZETIMIBE 10 MG PO TABS: 10.0000 mg | ORAL_TABLET | Freq: Every day | ORAL | 4 refills | Status: DC

## 2021-07-13 MED ORDER — LOSARTAN POTASSIUM 100 MG PO TABS: 100.0000 mg | ORAL_TABLET | Freq: Every day | ORAL | 4 refills | Status: DC

## 2021-07-13 MED ORDER — PRAVASTATIN SODIUM 40 MG PO TABS: 40.0000 mg | ORAL_TABLET | Freq: Every day | ORAL | 4 refills | Status: DC

## 2021-07-13 MED ORDER — TRAZODONE HCL 50 MG PO TABS: 25.0000 mg | ORAL_TABLET | Freq: Every day | ORAL | 4 refills | Status: DC

## 2021-07-13 MED ORDER — WEGOVY 0.25 MG/0.5ML ~~LOC~~ SOAJ: 0.2500 mg | SUBCUTANEOUS | 4 refills | Status: DC

## 2021-07-13 MED ORDER — BUSPIRONE HCL 5 MG PO TABS: 2.5000 mg | ORAL_TABLET | Freq: Three times a day (TID) | ORAL | 4 refills | Status: DC | PRN

## 2021-07-13 MED ORDER — METRONIDAZOLE 500 MG PO TABS: 500.0000 mg | ORAL_TABLET | Freq: Two times a day (BID) | ORAL | 0 refills | Status: AC

## 2021-07-13 MED ORDER — VITAMIN D (ERGOCALCIFEROL) 1.25 MG (50000 UNIT) PO CAPS: 50000.0000 [IU] | ORAL_CAPSULE | ORAL | 4 refills | Status: DC

## 2021-07-13 NOTE — Assessment & Plan Note (Signed)
Chronic, ongoing.  Continue current medication regimen and adjust as needed, monitor LFT as has history of elevation.  Recommend she take both Zetia and Pravastatin.  May consider adjusting statin regimen if ongoing elevations due to family history, could consider Praluent or Repatha. 

## 2021-07-13 NOTE — Assessment & Plan Note (Signed)
BMI 34.18 with difficulty losing, even with 6 months of diet and exercise focus. Recommended eating smaller high protein, low fat meals more frequently and exercising 30 mins a day 5 times a week with a goal of 10-15lb weight loss in the next 3 months. Patient voiced their understanding and motivation to adhere to these recommendations.  Will see if we can get [facility] for patient, may need PA, alerted staff.

## 2021-07-13 NOTE — Assessment & Plan Note (Signed)
Recommend she continue yearly lung screening and to schedule this, provided pamphlet with number to call and schedule.  Referral placed.

## 2021-07-13 NOTE — Progress Notes (Signed)
BP 109/72    Pulse 89    Temp 98.1 F (36.7 C) (Oral)    Ht 5' (1.524 m)    Wt 175 lb (79.4 kg)    SpO2 95%    BMI 34.18 kg/m    Subjective:    Patient ID: Allison Pham, female    DOB: [birth year], 61 y.o.   MRN: [id number]  HPI: Allison Pham is a 61 y.o. female  Chief Complaint  Patient presents with   Hyperlipidemia   Hypertension   COPD   Anxiety   Urinary Frequency    Patient states she has messaged in and she wanted to let Cledith Kamiya that she is urinating too much. Patient denies having any burning or discomfort with urination. Patient states she has had a hysterectomy and had her bladder lifted during that procedure. Patient states this is her first issues since the surgery, but she was made aware that she may have a prolapse in her bladder area.    Medication Management    Patient states when she takes her Amlodipine 5 mg. Patient states her first prescription lowered her blood pressure and when she has the prescription refilled. Patient states she notices it does not help and she notices the pills are different colors. Patient states she is also having issues with losing weight and states it has never been this hard and would like to discuss with provider.    Menopause    Patient states she would also like to discuss how to check to see if she is currently in Menopause.    URINARY SYMPTOMS Started with symptoms one month ago.  History of hysterectomy and bladder lift years ago.  Has increased urination recently.  She is concerned about menopause -- is agreeable to lab check next visit.   Dysuria: no Urinary frequency: yes Urgency: yes Small volume voids: no Symptom severity: no Urinary incontinence: no Foul odor: no Hematuria: no Abdominal pain: no Back pain: no Suprapubic pain/pressure: no Flank pain: no Fever:  no Vomiting: no Status: stable Previous urinary tract infection: yes Recurrent urinary tract infection: no Sexual activity: No sexually active History of  sexually transmitted disease: no Treatments attempted: increasing fluids    HYPERTENSION / HYPERLIPIDEMIA Currently taking Amlodipine 5 MG and Losartan 100 MG daily. Taking Zetia 10 MG and Pravastatin 40 MG daily for HLD. She is going to check with pharmacy as is concerned about the recent pills pharmacy provided her, thinks they are wrong.  She would like to lose weight -- has been working hard at this with walking and diet changes over past 6 months.  Her daughter takes Mancel Parsons and she would like to try this.  Her goal weight is 130 lbs.   Satisfied with current treatment? yes Duration of hypertension: chronic BP monitoring frequency: daily BP range: 120/70-80 range BP medication side effects: no Past BP meds: multiple kinds Duration of hyperlipidemia: chronic Cholesterol medication side effects: no Cholesterol supplements: none Past cholesterol medications: multiple different kinds Medication compliance: good compliance Aspirin: no Recent stressors: no Recurrent headaches: no Visual changes: no Palpitations: no Dyspnea: no Chest pain: no Lower extremity edema: no Dizzy/lightheaded: no   COPD Centrilobular mild noted on lung CT 2019.  She is a past smoker -- off and on, was a 1 PPD smoker in past.  Quit 4-5 years ago.    No current symptoms or inhalers.  Did not obtain yearly CT last year, recommend she obtain. COPD status: stable Satisfied with current  treatment?: yes Oxygen use: no Dyspnea frequency:  none Cough frequency: none Limitation of activity: no Productive cough: none Last Spirometry: unknown Pneumovax:  refuses Influenza:  refuses   VITAMIN D DEFICIENCY: Continues on Vitamin D every day -- last level 21.2 one year ago.  No recent falls or fractures.  No muscle aches.   DEPRESSION Continues on Trazodone 25 MG at night, although has not had in awhile.  Does have Buspar at home -- takes only with major anxiety. Mood status: controlled Satisfied with current  treatment?: yes Symptom severity: mild  Duration of current treatment : chronic Side effects: no Medication compliance: good compliance Psychotherapy/counseling: none Previous psychiatric medications:  Depressed mood: no Anxious mood: no Anhedonia: no Significant weight loss or gain: no Insomnia: yes hard to fall asleep Fatigue: no Feelings of worthlessness or guilt: no Impaired concentration/indecisiveness: no Suicidal ideations: no Hopelessness: no Crying spells: no Depression screen [facility] 2/9 07/13/2021 01/10/2021 01/09/2020 04/25/2019 02/28/2019  Decreased Interest 1 1 2 1 1   Down, Depressed, Hopeless 2 2 2 1 1   PHQ - 2 Score 3 3 4 2 2   Altered sleeping 1 1 1 1 1   Tired, decreased energy 2 0 1 1 1   Change in appetite 0 1 1 0 0  Feeling bad or failure about yourself  1 2 1 1  0  Trouble concentrating 1 0 0 0 0  Moving slowly or fidgety/restless 0 0 0 0 0  Suicidal thoughts 0 0 0 0 0  PHQ-9 Score 8 7 8 5 4   Difficult doing work/chores - Somewhat difficult Not difficult at all Not difficult at all Not difficult at all  Some recent data might be hidden    GAD 7 : Generalized Anxiety Score 07/13/2021 01/09/2020 04/25/2019 02/28/2019  Nervous, Anxious, on Edge 2 1 1 1   Control/stop worrying 1 0 0 1  Worry too much - different things 1 2 2 1   Trouble relaxing 1 0 1 1  Restless 1 0 1 0  Easily annoyed or irritable 1 1 0 1  Afraid - awful might happen 1 0 0 0  Total GAD 7 Score 8 4 5 5   Anxiety Difficulty - Not difficult at all Not difficult at all Not difficult at all    Relevant past medical, surgical, family and social history reviewed and updated as indicated. Interim medical history since our last visit reviewed. Allergies and medications reviewed and updated.  Review of Systems  Constitutional:  Negative for activity change, appetite change, diaphoresis, fatigue and fever.  Respiratory:  Negative for cough, chest tightness and shortness of breath.   Cardiovascular:  Negative  for chest pain, palpitations and leg swelling.  Gastrointestinal: Negative.   Genitourinary:  Positive for frequency. Negative for decreased urine volume, dysuria, hematuria and urgency.  Neurological: Negative.   Psychiatric/Behavioral: Negative.     Per HPI unless specifically indicated above     Objective:    BP 109/72    Pulse 89    Temp 98.1 F (36.7 C) (Oral)    Ht 5' (1.524 m)    Wt 175 lb (79.4 kg)    SpO2 95%    BMI 34.18 kg/m   Wt Readings from Last 3 Encounters:  07/13/21 175 lb (79.4 kg)  02/23/21 173 lb (78.5 kg)  01/28/21 171 lb (77.6 kg)    Physical Exam Vitals and nursing note reviewed.  Constitutional:      General: She is awake. She is not in acute distress.  Appearance: She is well-developed and well-groomed. She is obese. She is not ill-appearing or toxic-appearing.  HENT:     Head: Normocephalic.     Right Ear: Hearing normal.     Left Ear: Hearing normal.  Eyes:     General: Lids are normal.        Right eye: No discharge.        Left eye: No discharge.     Conjunctiva/sclera: Conjunctivae normal.     Pupils: Pupils are equal, round, and reactive to light.  Neck:     Vascular: No carotid bruit.  Cardiovascular:     Rate and Rhythm: Normal rate and regular rhythm.     Heart sounds: Normal heart sounds. No murmur heard.   No gallop.  Pulmonary:     Effort: Pulmonary effort is normal. No accessory muscle usage or respiratory distress.     Breath sounds: Normal breath sounds.  Abdominal:     General: Bowel sounds are normal. There is no distension.     Palpations: Abdomen is soft.     Tenderness: There is no abdominal tenderness. There is no right CVA tenderness or left CVA tenderness.  Musculoskeletal:     Cervical back: Normal range of motion and neck supple.     Right lower leg: No edema.     Left lower leg: No edema.  Skin:    General: Skin is warm and dry.  Neurological:     Mental Status: She is alert and oriented to person, place, and  time.  Psychiatric:        Attention and Perception: Attention normal.        Mood and Affect: Mood normal.        Speech: Speech normal.        Behavior: Behavior normal. Behavior is cooperative.        Thought Content: Thought content normal.   Results for orders placed or performed in visit on 07/13/21  WET PREP FOR [place], YEAST, CLUE   Urine  Result Value Ref Range   Trichomonas Exam Negative Negative   Yeast Exam Negative Negative   Clue Cell Exam Positive (A) Negative  Urinalysis, Routine w reflex microscopic  Result Value Ref Range   Specific Gravity, UA 1.020 1.005 - 1.030   pH, UA 5.5 5.0 - 7.5   Color, UA Yellow Yellow   Appearance Ur Clear Clear   Leukocytes,UA Negative Negative   Protein,UA Negative Negative/Trace   Glucose, UA Negative Negative   Ketones, UA Negative Negative   RBC, UA Negative Negative   Bilirubin, UA Negative Negative   Urobilinogen, Ur 0.2 0.2 - 1.0 mg/dL   Nitrite, UA Negative Negative      Assessment & Plan:   Problem List Items Addressed This Visit       Cardiovascular and Mediastinum   Carotid atherosclerosis, bilateral    Noted on scan in January 2020, will order repeat scan to reassess.  Continue statin therapy.      Relevant Medications   amLODipine (NORVASC) 5 MG tablet   ezetimibe (ZETIA) 10 MG tablet   losartan (COZAAR) 100 MG tablet   pravastatin (PRAVACHOL) 40 MG tablet   Other Relevant Orders   US Carotid Duplex Bilateral   Essential hypertension, benign    Chronic, with BP at goal today and on home readings.  Recommend she monitor BP at least a few mornings a week at home and document.  DASH diet at home.  Continue current medication regimen  and adjust as needed, will maintain off Metoprolol as is doing well Amlodipine and Losartan only.  Labs today: CBC & CMP.  Refills sent.  Recommend she reach out to pharmacy about pill concerns.  Return in July for physical.       Relevant Medications   amLODipine (NORVASC) 5 MG  tablet   ezetimibe (ZETIA) 10 MG tablet   losartan (COZAAR) 100 MG tablet   pravastatin (PRAVACHOL) 40 MG tablet   Other Relevant Orders   Lipid Panel w/o Chol/HDL Ratio     Respiratory   Centrilobular emphysema (HCC) - Primary    Chronic, stable with no inhalers.  Will plan on spirometry next visit.  If worsening symptoms initiate inhaler regimen.  Continue yearly lung screening -- recommend she call to schedule this and pamphlet provided.  Referral placed.      Relevant Orders   CBC with Differential/Platelet   Ambulatory Referral Lung Cancer Screening [facility]     Genitourinary   Bacterial vaginosis    Symptoms for a month.  UA reassuring with no concern for UTI.  Wet prep is + for clue cells, educated her on this.  She has taken Flagyl before for this.  Start Flagyl BID for 7 days.  Return to office for any worsening or ongoing symptoms.      Relevant Medications   metroNIDAZOLE (FLAGYL) 500 MG tablet   Other Relevant Orders   Urinalysis, Routine w reflex microscopic (Completed)   WET PREP FOR [place]ICH, YEAST, CLUE     Other   Depression, major, single episode, mild (HCC)    Chronic, ongoing.  Denies SI/HI.  Reports benefit from current regimen, will continue this and send refills as needed.  Continue therapy visits.  Return in 6 months.      Relevant Medications   busPIRone (BUSPAR) 5 MG tablet   traZODone (DESYREL) 50 MG tablet   Dyslipidemia    Chronic, ongoing.  Continue current medication regimen and adjust as needed, monitor LFT as has history of elevation.  Recommend she take both Zetia and Pravastatin.  May consider adjusting statin regimen if ongoing elevations due to family history, could consider Praluent or Repatha.      Relevant Medications   ezetimibe (ZETIA) 10 MG tablet   pravastatin (PRAVACHOL) 40 MG tablet   Other Relevant Orders   Comprehensive metabolic panel   Lipid Panel w/o Chol/HDL Ratio   Elevated serum GGT level    Check CMP and GGT  today.      Relevant Orders   Gamma GT   Comprehensive metabolic panel   Generalized anxiety disorder    Chronic, stable with Trazodone and continuation of Buspar. Continue current medication regimen and adjust as needed.  Denies SI/HI.  Refills sent in.  Continue therapy sessions.      Relevant Medications   busPIRone (BUSPAR) 5 MG tablet   traZODone (DESYREL) 50 MG tablet   Obesity (BMI 30.0-34.9)    BMI 34.18 with difficulty losing, even with 6 months of diet and exercise focus. Recommended eating smaller high protein, low fat meals more frequently and exercising 30 mins a day 5 times a week with a goal of 10-15lb weight loss in the next 3 months. Patient voiced their understanding and motivation to adhere to these recommendations.  Will see if we can get [facility] for patient, may need PA, alerted staff.        Smoking history    Recommend she continue yearly lung screening and to schedule  this, provided pamphlet with number to call and schedule.  Referral placed.      Relevant Orders   Ambulatory Referral Lung Cancer Screening [facility]   Vitamin D deficiency    Ongoing and stable.  Continue supplement and check Vit D level at physical.        Follow up plan: Return in about 26 weeks (around 01/11/2022) for Annual physical.

## 2021-07-13 NOTE — Assessment & Plan Note (Signed)
Ongoing and stable.  Continue supplement and check Vit D level at physical.

## 2021-07-13 NOTE — Assessment & Plan Note (Signed)
Chronic, stable with Trazodone and continuation of Buspar. Continue current medication regimen and adjust as needed.  Denies SI/HI.  Refills sent in.  Continue therapy sessions.

## 2021-07-13 NOTE — Assessment & Plan Note (Signed)
Noted on scan in January 2020, will order repeat scan to reassess.  Continue statin therapy.

## 2021-07-13 NOTE — Assessment & Plan Note (Signed)
Chronic, with BP at goal today and on home readings.  Recommend she monitor BP at least a few mornings a week at home and document.  DASH diet at home.  Continue current medication regimen and adjust as needed, will maintain off Metoprolol as is doing well Amlodipine and Losartan only.  Labs today: CBC & CMP.  Refills sent.  Recommend she reach out to pharmacy about pill concerns.  Return in July for physical.

## 2021-07-13 NOTE — Assessment & Plan Note (Signed)
Chronic, ongoing.  Denies SI/HI.  Reports benefit from current regimen, will continue this and send refills as needed.  Continue therapy visits.  Return in 6 months.

## 2021-07-13 NOTE — Assessment & Plan Note (Signed)
Symptoms for a month.  UA reassuring with no concern for UTI.  Wet prep is + for clue cells, educated her on this.  She has taken Flagyl before for this.  Start Flagyl BID for 7 days.  Return to office for any worsening or ongoing symptoms.

## 2021-07-13 NOTE — Assessment & Plan Note (Signed)
Chronic, stable with no inhalers.  Will plan on spirometry next visit.  If worsening symptoms initiate inhaler regimen.  Continue yearly lung screening -- recommend she call to schedule this and pamphlet provided.  Referral placed. 

## 2021-07-13 NOTE — Assessment & Plan Note (Signed)
Check CMP and GGT today. 

## 2021-07-14 LAB — CBC WITH DIFFERENTIAL/PLATELET
Basophils Absolute: 0.1 10*3/uL (ref 0.0–0.2)
Basos: 1 %
EOS (ABSOLUTE): 0.2 10*3/uL (ref 0.0–0.4)
Eos: 2 %
Hematocrit: 48.2 % — ABNORMAL HIGH (ref 34.0–46.6)
Hemoglobin: 16.8 g/dL — ABNORMAL HIGH (ref 11.1–15.9)
Immature Grans (Abs): 0 10*3/uL (ref 0.0–0.1)
Immature Granulocytes: 0 %
Lymphocytes Absolute: 3.5 10*3/uL — ABNORMAL HIGH (ref 0.7–3.1)
Lymphs: 44 %
MCH: 29.4 pg (ref 26.6–33.0)
MCHC: 34.9 g/dL (ref 31.5–35.7)
MCV: 84 fL (ref 79–97)
Monocytes Absolute: 0.7 10*3/uL (ref 0.1–0.9)
Monocytes: 9 %
Neutrophils Absolute: 3.4 10*3/uL (ref 1.4–7.0)
Neutrophils: 44 %
Platelets: 232 10*3/uL (ref 150–450)
RBC: 5.72 x10E6/uL — ABNORMAL HIGH (ref 3.77–5.28)
RDW: 12.9 % (ref 11.7–15.4)
WBC: 7.8 10*3/uL (ref 3.4–10.8)

## 2021-07-14 LAB — COMPREHENSIVE METABOLIC PANEL
ALT: 33 IU/L — ABNORMAL HIGH (ref 0–32)
AST: 24 IU/L (ref 0–40)
Albumin/Globulin Ratio: 1.7 (ref 1.2–2.2)
Albumin: 4.5 g/dL (ref 3.8–4.8)
Alkaline Phosphatase: 109 IU/L (ref 44–121)
BUN/Creatinine Ratio: 17 (ref 12–28)
BUN: 15 mg/dL (ref 8–27)
Bilirubin Total: 0.5 mg/dL (ref 0.0–1.2)
CO2: 22 mmol/L (ref 20–29)
Calcium: 9.7 mg/dL (ref 8.7–10.3)
Chloride: 105 mmol/L (ref 96–106)
Creatinine, Ser: 0.86 mg/dL (ref 0.57–1.00)
Globulin, Total: 2.6 g/dL (ref 1.5–4.5)
Glucose: 87 mg/dL (ref 70–99)
Potassium: 4.4 mmol/L (ref 3.5–5.2)
Sodium: 142 mmol/L (ref 134–144)
Total Protein: 7.1 g/dL (ref 6.0–8.5)
eGFR: 77 mL/min/{1.73_m2} (ref >59)

## 2021-07-14 LAB — LIPID PANEL W/O CHOL/HDL RATIO
Cholesterol, Total: 271 mg/dL — ABNORMAL HIGH (ref 100–199)
HDL: 40 mg/dL (ref >39)
LDL Chol Calc (NIH): 190 mg/dL — ABNORMAL HIGH (ref 0–99)
Triglycerides: 216 mg/dL — ABNORMAL HIGH (ref 0–149)
VLDL Cholesterol Cal: 41 mg/dL — ABNORMAL HIGH (ref 5–40)

## 2021-07-14 LAB — GAMMA GT: GGT: 197 IU/L — ABNORMAL HIGH (ref 0–60)

## 2021-07-14 NOTE — Progress Notes (Signed)
Contacted via [place] afternoon Malayja, your labs have returned: - Liver function levels, ALT and AST, remain stable - but GGT remains elevated.  Will continue to monitor this and obtain repeat imaging as needed.  Last liver and gall bladder imaging was in 2018 and was normal. - Kidney function, creatinine and eGFR, is normal.  - CBC shows ongoing mild elevation in hemoglobin and hematocrit, which has trended up a small amount and lymphocytes -- we will recheck next visit, if ongoing may send you to hematology for further assessment.  Could be related to past smoking.   - Cholesterol levels are not at goal, I wonder if we need to change you to a different statin, such as Rosuvastatin since liver function is stable.  What are your thoughts on this?  I will send in and we can try if you agree with this plan. - I did order lung screening referral and ordered carotid imaging repeat -- they will be calling to schedule, please listen out for these.  Any questions? Keep being amazing!!  Thank you for allowing me to participate in your care.  I appreciate you. Kindest regards, Leverett Camplin

## 2021-10-04 ENCOUNTER — Ambulatory Visit
Admission: EM | Admit: 2021-10-04 | Discharge: 2021-10-04 | Disposition: A | Discharge status: Home / Self Care | Payer: Medicare Other | Attending: Emergency Medicine | Admitting: Emergency Medicine

## 2021-10-04 ENCOUNTER — Ambulatory Visit: Payer: Self-pay

## 2021-10-04 ENCOUNTER — Other Ambulatory Visit: Payer: Self-pay

## 2021-10-04 ENCOUNTER — Encounter: Payer: Self-pay | Admitting: Emergency Medicine

## 2021-10-04 ENCOUNTER — Encounter: Payer: Self-pay | Admitting: Nurse Practitioner

## 2021-10-04 DIAGNOSIS — L259 Unspecified contact dermatitis, unspecified cause: Secondary | ICD-10-CM | POA: Insufficient documentation

## 2021-10-04 DIAGNOSIS — R21 Rash and other nonspecific skin eruption: Secondary | ICD-10-CM

## 2021-10-04 MED ORDER — MUPIROCIN CALCIUM 2 % EX CREA: 1.0000 "application " | TOPICAL_CREAM | Freq: Two times a day (BID) | CUTANEOUS | 0 refills | Status: AC

## 2021-10-04 NOTE — Telephone Encounter (Signed)
Noted  

## 2021-10-04 NOTE — Discharge Instructions (Signed)
Avoid heat/ hot water as it makes rashes worse.  Calamine lotion, mupirocin ointment.  Follow-up with PCP if symptoms do not improve in 2 to 3 days ?

## 2021-10-04 NOTE — Telephone Encounter (Signed)
Routing to provider as an FYI

## 2021-10-04 NOTE — Telephone Encounter (Signed)
Called to offer patient the appointment she states she is currently at urgent care getting the rash looked at. I let her know that if she needs anything to give our office a call. ?

## 2021-10-04 NOTE — Telephone Encounter (Signed)
?  Chief Complaint: rash ?Symptoms: rash R thigh, redness, swelling, and itching/burning ?Frequency: 3 days  ?Pertinent Negatives: Patient denies pain to rash ?Disposition: '[]'$ ED /'[]'$ Urgent Care (no appt availability in office) / '[x]'$ Appointment(In office/virtual)/ '[]'$  [facility]/ '[]'$ Home Care/ '[]'$ Refused Recommended Disposition /'[]'$ [facility]/ '[]'$  Follow-up with PCP ?Additional Notes: I tried to schedule pt for OV on 10/05/21 at 1120 with [name], NP but pt states her heat is broke and the heat/ac guy is coming tomorrow around that time and didn't want to put that off. Attempted to schedule VV but nothing available. Advised pt to continue putting the triamcinolone cream on there and apply ice as well as take benadryl and she states she will try all of those and if unable to get it better will go to ED.  ? ? ?Reason for Disposition ? Looks like a boil, infected sore, deep ulcer or other infected rash ? ?Answer Assessment - Initial Assessment Questions ?1. APPEARANCE of RASH: "Describe the rash."  ?    Red rash and in middle of rash looks like bite or sore  ?2. LOCATION: "Where is the rash located?"  ?    R thigh  ?5. ONSET: "When did the rash start?"  ?    3 days  ?6. ITCHING: "Does the rash itch?" If Yes, ask: "How bad is the itch?"  (Scale 0-10; or none, mild, moderate, severe) ?    Mild to moderate  ?7. PAIN: "Does the rash hurt?" If Yes, ask: "How bad is the pain?"  (Scale 0-10; or none, mild, moderate, severe) ?   - NONE (0): no pain ?   - MILD (1-3): doesn't interfere with normal activities  ?   - MODERATE (4-7): interferes with normal activities or awakens from sleep  ?   - SEVERE (8-10): excruciating pain, unable to do any normal activities ?    discomfort ?8. OTHER SYMPTOMS: "Do you have any other symptoms?" (e.g., fever) ?    swelling ? ?Protocols used: Rash or Redness - Localized-A-AH ? ?

## 2021-10-04 NOTE — ED Provider Notes (Signed)
[facility]    CSN: [id number] Arrival date & time: 10/04/21  1443      History   Chief Complaint Chief Complaint  Patient presents with   Rash    HPI MAYBETH HOBBY is a 61 y.o. female.   61 year old female, Janat Saunderson, presents to the urgent care with complaint of right inner thigh rash for 4 days.  Patient states she has tried ice calamine lotion and triamcinolone cream without relief.  No new meds lotions detergents or clothing.  Patient denies any trauma injury or insect bite  The history is provided by the patient. No language interpreter was used.   Past Medical History:  Diagnosis Date   Anxiety    Carotid atherosclerosis, bilateral 07/16/2018   rescan Jan 2021   Emphysema of lung (HCC) 03/21/2018   GERD (gastroesophageal reflux disease)    Hypercholesteremia    Hypertension    Tick bite    Vertigo    Wears dentures    partial upper    Patient Active Problem List   Diagnosis Date Noted   Bacterial vaginosis 07/13/2021   Personal history of colonic polyps    Polyp of descending colon    Family history of colon cancer in mother 01/10/2021   Depression, major, single episode, mild (HCC) 01/09/2021   Rash and nonspecific skin eruption 01/09/2020   Generalized anxiety disorder 01/20/2019   Allergic rhinitis 10/17/2018   Carotid atherosclerosis, bilateral 07/16/2018   Vitamin D deficiency 07/02/2018   Centrilobular emphysema (HCC) 03/21/2018   Obesity (BMI 30.0-34.9) 02/11/2018   Smoking history 05/15/2016   Elevated serum GGT level 05/15/2016   Spondylosis of cervical spine 07/08/2015   Varicose vein of leg 05/25/2015   Essential hypertension, benign 04/28/2015   Dyslipidemia 04/28/2015    Past Surgical History:  Procedure Laterality Date   ABDOMINAL HYSTERECTOMY     Partial; bladder suspension   COLONOSCOPY N/A 01/28/2021   Procedure: COLONOSCOPY;  Surgeon: Midge Minium, MD;  Location: [facility];  Service: Endoscopy;   Laterality: N/A;   COLONOSCOPY WITH PROPOFOL N/A 05/10/2017   Procedure: COLONOSCOPY WITH PROPOFOL;  Surgeon: Midge Minium, MD;  Location: [facility];  Service: Endoscopy;  Laterality: N/A;   HEMORRHOID SURGERY     POLYPECTOMY N/A 05/10/2017   Procedure: POLYPECTOMY;  Surgeon: Midge Minium, MD;  Location: [facility];  Service: Endoscopy;  Laterality: N/A;   POLYPECTOMY  01/28/2021   Procedure: POLYPECTOMY;  Surgeon: Midge Minium, MD;  Location: [facility];  Service: Endoscopy;;    OB History   No obstetric history on file.      Home Medications    Prior to Admission medications   Medication Sig Start Date End Date Taking? Authorizing Provider  amLODipine (NORVASC) 5 MG tablet Take 1 tablet (5 mg total) by mouth daily. 07/13/21  Yes Cannady, Corrie Dandy T, NP  aspirin 81 MG chewable tablet Chew 81 mg by mouth as needed.    Yes [provider]  busPIRone (BUSPAR) 5 MG tablet Take 0.5-1 tablets (2.5-5 mg total) by mouth 3 (three) times daily as needed. 07/13/21  Yes Cannady, [name] T, NP  Cholecalciferol 1.25 MG (50000 UT) TABS Take 1 tablet by mouth once a week. 08/19/20  Yes Cannady, [name] T, NP  losartan (COZAAR) 100 MG tablet Take 1 tablet (100 mg total) by mouth daily. 07/13/21  Yes Cannady, [name] T, NP  mupirocin cream (BACTROBAN) 2 % Apply 1 application. topically 2 (two) times daily for 7 days.  10/04/21 10/11/21 Yes Edyn Qazi, Para March, NP  pravastatin (PRAVACHOL) 40 MG tablet Take 1 tablet (40 mg total) by mouth daily. 07/13/21  Yes Cannady, Corrie Dandy T, NP  traZODone (DESYREL) 50 MG tablet Take 0.5 tablets (25 mg total) by mouth at bedtime. 07/13/21  Yes Cannady, [name] T, NP  Vitamin D, Ergocalciferol, (DRISDOL) 1.25 MG (50000 UNIT) CAPS capsule Take 1 capsule (50,000 Units total) by mouth once a week. 07/13/21  Yes Cannady, [name] T, NP  ezetimibe (ZETIA) 10 MG tablet Take 1 tablet (10 mg total) by mouth daily. 07/13/21   Cannady, Corrie Dandy T, NP  loratadine  (CLARITIN) 10 MG tablet Take 1 tablet (10 mg total) by mouth daily as needed for allergies. 10/17/18   Kerman Passey, MD  Semaglutide-Weight Management (WEGOVY) 0.25 MG/0.5ML SOAJ Inject 0.25 mg into the skin once a week. 07/13/21   Marjie Skiff, NP    Family History Family History  Problem Relation Age of Onset   Cancer Mother        Colon   Hypertension Mother    Hypertension Father    Heart attack Father    Heart disease Father    Hypertension Sister    COPD Sister    Hypertension Brother    Heart disease Brother    Cancer Maternal Aunt    Breast cancer Maternal Aunt    Cancer Maternal Uncle    Breast cancer Maternal Aunt    Diabetes Neg Hx    Stroke Neg Hx     Social History Social History   Tobacco Use   Smoking status: Former    Packs/day: 1.00    Years: 38.00    Pack years: 38.00    Types: Cigarettes    Quit date: 09/13/2015    Years since quitting: 6.0   Smokeless tobacco: Never  Vaping Use   Vaping Use: Former  Substance Use Topics   Alcohol use: Never    Comment: rare - holidays   Drug use: No     Allergies   Latex and Penicillins   Review of Systems Review of Systems  Skin:  Positive for rash.  All other systems reviewed and are negative.   Physical Exam Triage Vital Signs ED Triage Vitals  Enc Vitals Group     BP 10/04/21 1527 (!) 155/96     Pulse Rate 10/04/21 1527 86     Resp 10/04/21 1527 18     Temp 10/04/21 1527 98 F (36.7 C)     Temp Source 10/04/21 1527 Oral     SpO2 10/04/21 1527 99 %     Weight 10/04/21 1524 175 lb 0.7 oz (79.4 kg)     Height 10/04/21 1524 5' (1.524 m)     Head Circumference --      Peak Flow --      Pain Score 10/04/21 1523 4     Pain Loc --      Pain Edu? --      Excl. in GC? --    No data found.  Updated Vital Signs BP (!) 155/96 (BP Location: Left Arm)   Pulse 86   Temp 98 F (36.7 C) (Oral)   Resp 18   Ht 5' (1.524 m)   Wt 175 lb 0.7 oz (79.4 kg)   SpO2 99%   BMI 34.19 kg/m    Visual Acuity Right Eye Distance:   Left Eye Distance:   Bilateral Distance:    Right Eye Near:   Left Eye Near:  Bilateral Near:     Physical Exam Vitals and nursing note reviewed.  Constitutional:      General: She is not in acute distress.    Appearance: She is well-developed.  HENT:     Head: Normocephalic and atraumatic.  Eyes:     Conjunctiva/sclera: Conjunctivae normal.  Cardiovascular:     Rate and Rhythm: Normal rate and regular rhythm.     Heart sounds: No murmur heard. Pulmonary:     Effort: Pulmonary effort is normal. No respiratory distress.     Breath sounds: Normal breath sounds.  Abdominal:     Palpations: Abdomen is soft.     Tenderness: There is no abdominal tenderness.  Musculoskeletal:        General: No swelling.     Cervical back: Neck supple.  Skin:    General: Skin is warm and dry.     Capillary Refill: Capillary refill takes less than 2 seconds.     Findings: Erythema and rash present. Rash is macular. Rash is not pustular or vesicular.          Comments: No streaking no purulent drainage no fluctuance  Neurological:     General: No focal deficit present.     Mental Status: She is alert and oriented to person, place, and time.     GCS: GCS eye subscore is 4. GCS verbal subscore is 5. GCS motor subscore is 6.  Psychiatric:        Attention and Perception: Attention normal.        Mood and Affect: Mood normal.        Speech: Speech normal.        Behavior: Behavior normal.     UC Treatments / Results  Labs (all labs ordered are listed, but only abnormal results are displayed) Labs Reviewed - No data to display  EKG   Radiology No results found.  Procedures Procedures (including critical care time)  Medications Ordered in UC Medications - No data to display  Initial Impression / Assessment and Plan / UC Course  I have reviewed the triage vital signs and the nursing notes.  Pertinent labs & imaging results that were  available during my care of the patient were reviewed by me and considered in my medical decision making (see chart for details).     Ddx: Rash, dermatitis, insect bite Final Clinical Impressions(s) / UC Diagnoses   Final diagnoses:  Rash and nonspecific skin eruption     Discharge Instructions      Avoid heat/ hot water as it makes rashes worse.  Calamine lotion, mupirocin ointment.  Follow-up with PCP if symptoms do not improve in 2 to 3 days     ED Prescriptions     Medication Sig Dispense Auth. Provider   mupirocin cream (BACTROBAN) 2 % Apply 1 application. topically 2 (two) times daily for 7 days. 15 g Rashaad Hallstrom, Para March, NP      PDMP not reviewed this encounter.   Clancy Gourd, NP 10/04/21 [phone]

## 2021-10-04 NOTE — ED Triage Notes (Signed)
Pt has on her right inner thigh. Started about 4 days ago. She states it is red, blistered in the center. She states the rash burns. She has tried ice, calamine lotion, and triamcinolone cream.   ?

## 2021-10-05 MED ORDER — TRIAMCINOLONE ACETONIDE 0.1 % EX CREA: 1.0000 "application " | TOPICAL_CREAM | Freq: Two times a day (BID) | CUTANEOUS | 0 refills | Status: DC

## 2021-10-05 MED ORDER — METHYLPREDNISOLONE 4 MG PO TBPK: ORAL_TABLET | ORAL | 0 refills | Status: DC

## 2021-10-17 DIAGNOSIS — I10 Essential (primary) hypertension: Secondary | ICD-10-CM | POA: Diagnosis not present

## 2021-10-17 DIAGNOSIS — L237 Allergic contact dermatitis due to plants, except food: Secondary | ICD-10-CM | POA: Diagnosis not present

## 2021-10-20 ENCOUNTER — Ambulatory Visit (INDEPENDENT_AMBULATORY_CARE_PROVIDER_SITE_OTHER): Payer: Medicare Other | Admitting: Family Medicine

## 2021-10-20 ENCOUNTER — Encounter: Payer: Self-pay | Admitting: Family Medicine

## 2021-10-20 VITALS — BP 169/92 | HR 71 | Temp 97.6°F | Wt 177.4 lb

## 2021-10-20 DIAGNOSIS — L237 Allergic contact dermatitis due to plants, except food: Secondary | ICD-10-CM

## 2021-10-20 DIAGNOSIS — I10 Essential (primary) hypertension: Secondary | ICD-10-CM | POA: Diagnosis not present

## 2021-10-20 MED ORDER — AMLODIPINE BESYLATE 5 MG PO TABS: 10.0000 mg | ORAL_TABLET | Freq: Every day | ORAL | 4 refills | Status: DC

## 2021-10-20 MED ORDER — DIPHENHYDRAMINE HCL 50 MG PO TABS: 50.0000 mg | ORAL_TABLET | Freq: Every evening | ORAL | 6 refills | Status: DC | PRN

## 2021-10-20 MED ORDER — TRIAMCINOLONE ACETONIDE 40 MG/ML IJ SUSP
80.0000 mg | Freq: Once | INTRAMUSCULAR | Status: AC
  Administered 2021-10-20: 80 mg via INTRAMUSCULAR

## 2021-10-20 MED ORDER — FAMOTIDINE 20 MG PO TABS: 20.0000 mg | ORAL_TABLET | Freq: Two times a day (BID) | ORAL | 6 refills | Status: DC

## 2021-10-20 MED ORDER — CETIRIZINE HCL 10 MG PO TABS: 10.0000 mg | ORAL_TABLET | Freq: Every day | ORAL | 6 refills | Status: DC

## 2021-10-20 MED ORDER — PREDNISONE 10 MG PO TABS: ORAL_TABLET | ORAL | 0 refills | Status: DC

## 2021-10-20 NOTE — Assessment & Plan Note (Signed)
BP running high on prednisone. Will have her increase her amlodipine to '10mg'$  daily while on the prednisone.  ?

## 2021-10-20 NOTE — Patient Instructions (Signed)
Take 2 ('10mg'$ ) of her amlodipine while on the steroids  ?

## 2021-10-20 NOTE — Progress Notes (Signed)
? ?BP (!) 169/92   Pulse 71   Temp 97.6 ?F (36.4 ?C)   Wt 177 lb 6.4 oz (80.5 kg)   SpO2 98%   BMI 34.65 kg/m?   ? ?Subjective:  ? ? Patient ID: Allison Pham, female    DOB: 01-30-1961, 61 y.o.   MRN: [id number] ? ?HPI: ?Allison Pham is a 61 y.o. female ? ?Chief Complaint  ?Patient presents with  ? Rash  ?  Patient has a rash on her for about a week and a half on her arms, neck, back and legs. Patient states it itches at night, has been to ED and urgent care and has not gotten relief.   ? ?RASH ?Duration:  About 10 days  ?Location: generalized  ?Itching: yes ?Burning: yes ?Redness: yes ?Oozing: no ?Scaling: yes ?Blisters: yes ?Painful: no ?Fevers: no ?Change in detergents/soaps/personal care products: no ?Recent illness: no ?Recent travel:yes ?History of same: yes ?Context: worse ?Alleviating factors: nothing ?Treatments attempted: steroids, hydrocortisone cream, OTC anit-fungal, and lotion/moisturizer ?Shortness of breath: no  ?Throat/tongue swelling: no ?Myalgias/arthralgias: no ? ?Relevant past medical, surgical, family and social history reviewed and updated as indicated. Interim medical history since our last visit reviewed. ?Allergies and medications reviewed and updated. ? ?Review of Systems  ?Constitutional: Negative.   ?Respiratory: Negative.    ?Cardiovascular: Negative.   ?Gastrointestinal: Negative.   ?Musculoskeletal: Negative.   ?Skin:  Positive for rash. Negative for color change, pallor and wound.  ?Neurological: Negative.   ?Psychiatric/Behavioral: Negative.    ? ?Per HPI unless specifically indicated above ? ?   ?Objective:  ?  ?BP (!) 169/92   Pulse 71   Temp 97.6 ?F (36.4 ?C)   Wt 177 lb 6.4 oz (80.5 kg)   SpO2 98%   BMI 34.65 kg/m?   ?Wt Readings from Last 3 Encounters:  ?10/20/21 177 lb 6.4 oz (80.5 kg)  ?10/04/21 175 lb 0.7 oz (79.4 kg)  ?07/13/21 175 lb (79.4 kg)  ?  ?Physical Exam ?Vitals and nursing note reviewed.  ?Constitutional:   ?   General: She is not in acute distress. ?    Appearance: Normal appearance. She is not ill-appearing, toxic-appearing or diaphoretic.  ?HENT:  ?   Head: Normocephalic and atraumatic.  ?   Right Ear: External ear normal.  ?   Left Ear: External ear normal.  ?   Nose: Nose normal.  ?   Mouth/Throat:  ?   Mouth: Mucous membranes are moist.  ?   Pharynx: Oropharynx is clear.  ?Eyes:  ?   General: No scleral icterus.    ?   Right eye: No discharge.     ?   Left eye: No discharge.  ?   Extraocular Movements: Extraocular movements intact.  ?   Conjunctiva/sclera: Conjunctivae normal.  ?   Pupils: Pupils are equal, round, and reactive to light.  ?Cardiovascular:  ?   Rate and Rhythm: Normal rate and regular rhythm.  ?   Pulses: Normal pulses.  ?   Heart sounds: Normal heart sounds. No murmur heard. ?  No friction rub. No gallop.  ?Pulmonary:  ?   Effort: Pulmonary effort is normal. No respiratory distress.  ?   Breath sounds: Normal breath sounds. No stridor. No wheezing, rhonchi or rales.  ?Chest:  ?   Chest wall: No tenderness.  ?Musculoskeletal:     ?   General: Normal range of motion.  ?   Cervical back: Normal range of motion and neck  supple.  ?Skin: ?   General: Skin is warm and dry.  ?   Capillary Refill: Capillary refill takes less than 2 seconds.  ?   Coloration: Skin is not jaundiced or pale.  ?   Findings: Rash (on arms legs and chest) present. No bruising, erythema or lesion.  ?Neurological:  ?   General: No focal deficit present.  ?   Mental Status: She is alert and oriented to person, place, and time. Mental status is at baseline.  ?Psychiatric:     ?   Mood and Affect: Mood normal.     ?   Behavior: Behavior normal.     ?   Thought Content: Thought content normal.     ?   Judgment: Judgment normal.  ? ? ?Results for orders placed or performed in visit on 07/13/21  ?WET PREP FOR [place], YEAST, CLUE  ? Urine  ?Result Value Ref Range  ? Trichomonas Exam Negative Negative  ? Yeast Exam Negative Negative  ? Clue Cell Exam Positive (A) Negative  ?Gamma GT   ?Result Value Ref Range  ? GGT 197 (H) 0 - 60 IU/L  ?Comprehensive metabolic panel  ?Result Value Ref Range  ? Glucose 87 70 - 99 mg/dL  ? BUN 15 8 - 27 mg/dL  ? Creatinine, Ser 0.86 0.57 - 1.00 mg/dL  ? eGFR 77 >59 mL/min/1.73  ? BUN/Creatinine Ratio 17 12 - 28  ? Sodium 142 134 - 144 mmol/L  ? Potassium 4.4 3.5 - 5.2 mmol/L  ? Chloride 105 96 - 106 mmol/L  ? CO2 22 20 - 29 mmol/L  ? Calcium 9.7 8.7 - 10.3 mg/dL  ? Total Protein 7.1 6.0 - 8.5 g/dL  ? Albumin 4.5 3.8 - 4.8 g/dL  ? Globulin, Total 2.6 1.5 - 4.5 g/dL  ? Albumin/Globulin Ratio 1.7 1.2 - 2.2  ? Bilirubin Total 0.5 0.0 - 1.2 mg/dL  ? Alkaline Phosphatase 109 44 - 121 IU/L  ? AST 24 0 - 40 IU/L  ? ALT 33 (H) 0 - 32 IU/L  ?Lipid Panel w/o Chol/HDL Ratio  ?Result Value Ref Range  ? Cholesterol, Total 271 (H) 100 - 199 mg/dL  ? Triglycerides 216 (H) 0 - 149 mg/dL  ? HDL 40 >39 mg/dL  ? VLDL Cholesterol Cal 41 (H) 5 - 40 mg/dL  ? LDL Chol Calc (NIH) 190 (H) 0 - 99 mg/dL  ? Lipid Comment: Comment   ?CBC with Differential/Platelet  ?Result Value Ref Range  ? WBC 7.8 3.4 - 10.8 x10E3/uL  ? RBC 5.72 (H) 3.77 - 5.28 x10E6/uL  ? Hemoglobin 16.8 (H) 11.1 - 15.9 g/dL  ? Hematocrit 48.2 (H) 34.0 - 46.6 %  ? MCV 84 79 - 97 fL  ? MCH 29.4 26.6 - 33.0 pg  ? MCHC 34.9 31.5 - 35.7 g/dL  ? RDW 12.9 11.7 - 15.4 %  ? Platelets 232 150 - 450 x10E3/uL  ? Neutrophils 44 Not Estab. %  ? Lymphs 44 Not Estab. %  ? Monocytes 9 Not Estab. %  ? Eos 2 Not Estab. %  ? Basos 1 Not Estab. %  ? Neutrophils Absolute 3.4 1.4 - 7.0 x10E3/uL  ? Lymphocytes Absolute 3.5 (H) 0.7 - 3.1 x10E3/uL  ? Monocytes Absolute 0.7 0.1 - 0.9 x10E3/uL  ? EOS (ABSOLUTE) 0.2 0.0 - 0.4 x10E3/uL  ? Basophils Absolute 0.1 0.0 - 0.2 x10E3/uL  ? Immature Granulocytes 0 Not Estab. %  ? Immature Grans (Abs) 0.0 0.0 -  0.1 x10E3/uL  ?Urinalysis, Routine w reflex microscopic  ?Result Value Ref Range  ? Specific Gravity, UA 1.020 1.005 - 1.030  ? pH, UA 5.5 5.0 - 7.5  ? Color, UA Yellow Yellow  ? Appearance Ur Clear  Clear  ? Leukocytes,UA Negative Negative  ? Protein,UA Negative Negative/Trace  ? Glucose, UA Negative Negative  ? Ketones, UA Negative Negative  ? RBC, UA Negative Negative  ? Bilirubin, UA Negative Negative  ? Urobilinogen, Ur 0.2 0.2 - 1.0 mg/dL  ? Nitrite, UA Negative Negative  ? ?   ?Assessment & Plan:  ? ?Problem List Items Addressed This Visit   ? ?  ? Cardiovascular and Mediastinum  ? Essential hypertension, benign  ?  BP running high on prednisone. Will have her increase her amlodipine to 55m daily while on the prednisone.  ? ?  ?  ? Relevant Medications  ? amLODipine (NORVASC) 5 MG tablet  ? ?Other Visit Diagnoses   ? ? Allergic contact dermatitis due to plants, except food    -  Primary  ? Severe. Will treat with triamcinalone shot today, followed by prednisone tomorrow, benadryl, zyrtec and pepcid. Recheck 1 week.   ? Relevant Medications  ? triamcinolone acetonide (KENALOG-40) injection 80 mg (Completed)  ? ?  ?  ? ?Follow up plan: ?Return in about 1 week (around 10/27/2021) for with me or [name]. ? ? ? ? ? ?

## 2021-10-23 NOTE — Patient Instructions (Signed)
Contact Dermatitis Dermatitis is redness, soreness, and swelling (inflammation) of the skin. Contact dermatitis is a reaction to something that touches the skin. There are two types of contact dermatitis: Irritant contact dermatitis. This happens when something bothers (irritates) your skin, like soap. Allergic contact dermatitis. This is caused when you are exposed to something that you are allergic to, such as poison ivy. What are the causes? Common causes of irritant contact dermatitis include: Makeup. Soaps. Detergents. Bleaches. Acids. Metals, such as nickel. Common causes of allergic contact dermatitis include: Plants. Chemicals. Jewelry. Latex. Medicines. Preservatives in products, such as clothing. What increases the risk? Having a job that exposes you to things that bother your skin. Having asthma or eczema. What are the signs or symptoms? Symptoms may happen anywhere the irritant has touched your skin. Symptoms include: Dry or flaky skin. Redness. Cracks. Itching. Pain or a burning feeling. Blisters. Blood or clear fluid draining from skin cracks. With allergic contact dermatitis, swelling may occur. This may happen in places such as the eyelids, mouth, or genitals. How is this treated? This condition is treated by checking for the cause of the reaction and protecting your skin. Treatment may also include: Steroid creams, ointments, or medicines. Antibiotic medicines or other ointments, if you have a skin infection. Lotion or medicines to help with itching. A bandage (dressing). Follow these instructions at home: Skin care Moisturize your skin as needed. Put cool cloths on your skin. Put a baking soda paste on your skin. Stir water into baking soda until it looks like a paste. Do not scratch your skin. Avoid having things rub up against your skin. Avoid the use of soaps, perfumes, and dyes. Medicines Take or apply over-the-counter and prescription medicines  only as told by your doctor. If you were prescribed an antibiotic medicine, take or apply it as told by your doctor. Do not stop using it even if your condition starts to get better. Bathing Take a bath with: Epsom salts. Baking soda. Colloidal oatmeal. Bathe less often. Bathe in warm water. Avoid using hot water. Bandage care If you were given a bandage, change it as told by your doctor. Wash your hands with soap and water before and after you change your bandage. If soap and water are not available, use hand sanitizer. General instructions Avoid the things that caused your reaction. If you do not know what caused it, keep a journal. Write down: What you eat. What skin products you use. What you drink. What you wear in the area that has symptoms. This includes jewelry. Check the affected areas every day for signs of infection. Check for: More redness, swelling, or pain. More fluid or blood. Warmth. Pus or a bad smell. Keep all follow-up visits as told by your doctor. This is important. Contact a doctor if: You do not get better with treatment. Your condition gets worse. You have signs of infection, such as: More swelling. Tenderness. More redness. Soreness. Warmth. You have a fever. You have new symptoms. Get help right away if: You have a very bad headache. You have neck pain. Your neck is stiff. You throw up (vomit). You feel very sleepy. You see red streaks coming from the area. Your bone or joint near the area hurts after the skin has healed. The area turns darker. You have trouble breathing. Summary Dermatitis is redness, soreness, and swelling of the skin. Symptoms may occur where the irritant has touched you. Treatment may include medicines and skin care. If you do not   know what caused your reaction, keep a journal. Contact a doctor if your condition gets worse or you have signs of infection. This information is not intended to replace advice given to you by  your health care provider. Make sure you discuss any questions you have with your health care provider. Document Revised: 03/28/2021 Document Reviewed: 03/28/2021 Elsevier Patient Education  2023 [employer]  

## 2021-10-27 ENCOUNTER — Encounter: Payer: Self-pay | Admitting: Nurse Practitioner

## 2021-10-27 ENCOUNTER — Ambulatory Visit (INDEPENDENT_AMBULATORY_CARE_PROVIDER_SITE_OTHER): Payer: Medicare Other | Admitting: Nurse Practitioner

## 2021-10-27 VITALS — BP 138/88 | HR 70 | Temp 98.5°F | Wt 177.8 lb

## 2021-10-27 DIAGNOSIS — L2489 Irritant contact dermatitis due to other agents: Secondary | ICD-10-CM | POA: Diagnosis not present

## 2021-10-27 DIAGNOSIS — B370 Candidal stomatitis: Secondary | ICD-10-CM | POA: Diagnosis not present

## 2021-10-27 DIAGNOSIS — I10 Essential (primary) hypertension: Secondary | ICD-10-CM

## 2021-10-27 MED ORDER — NYSTATIN 100000 UNIT/ML MT SUSP: 5.0000 mL | Freq: Three times a day (TID) | OROMUCOSAL | 0 refills | Status: DC

## 2021-10-27 MED ORDER — HYDROCORTISONE VALERATE 0.2 % EX CREA: TOPICAL_CREAM | Freq: Two times a day (BID) | CUTANEOUS | 5 refills | Status: AC

## 2021-10-27 NOTE — Assessment & Plan Note (Signed)
Chronic, with BP at goal today on recheck - elevations at home with Prednisone on board.  Recommend she monitor BP at least a few mornings a week at home and document.  DASH diet at home.  Continue current medication regimen and adjust as needed, may be able to reduce Amlodipine back down after Prednisone complete.   ?

## 2021-10-27 NOTE — Progress Notes (Signed)
? ?BP 138/88 (BP Location: Left Arm)   Pulse 70   Temp 98.5 ?F (36.9 ?C) (Oral)   Wt 177 lb 12.8 oz (80.6 kg)   SpO2 96%   BMI 34.72 kg/m?   ? ?Subjective:  ? ? Patient ID: Allison Pham, female    DOB: 09-Dec-1960, 61 y.o.   MRN: [id number] ? ?HPI: ?Allison Pham is a 61 y.o. female ? ?Chief Complaint  ?Patient presents with  ? Rash  ?  1 week f/up- pt states she still has the rash but it is better than last week. States she thinks she may have thrush in her mouth due to the medications  ? ?HYPERTENSION ?Amlodipine increased to 10 MG at visit 10/20/21 due to elevations with Prednisone on board, continues on Losartan 10 MG daily with this. ?Hypertension status: stable  ?Satisfied with current treatment? yes ?Duration of hypertension: chronic ?BP monitoring frequency:  daily ?BP range: 140-150/80 ?BP medication side effects:  no ?Medication compliance: good compliance ?Aspirin: yes ?Recurrent headaches: no ?Visual changes: no ?Palpitations: no ?Dyspnea: no ?Chest pain: no ?Lower extremity edema: no ?Dizzy/lightheaded: no  ? ?RASH ?Follow-up today for contact dermatitis, was treated on 10/20/21 with Zyrtec, Pepcid, and Prednisone.  Overall rash is improving with exception of right forearm and bumps to right upper arm.  She has noticed some thrush to her tongue since starting the medications. ?Duration:  weeks  ?Location: arms  ?Itching: no ?Burning: no ?Redness: yes ?Oozing: no ?Scaling: yes ?Blisters: no ?Painful: no ?Fevers: no ?Change in detergents/soaps/personal care products: no ?Recent illness: no ?Recent travel:yes ?History of same: no ?Context: better ?Alleviating factors: Prednisone, Pepcid, Cream ?Treatments attempted: as above ?Shortness of breath: no  ?Throat/tongue swelling: no ?Myalgias/arthralgias: no  ? ?Relevant past medical, surgical, family and social history reviewed and updated as indicated. Interim medical history since our last visit reviewed. ?Allergies and medications reviewed and  updated. ? ?Review of Systems  ?Constitutional:  Negative for activity change, appetite change, diaphoresis, fatigue and fever.  ?Respiratory:  Negative for cough, chest tightness and shortness of breath.   ?Cardiovascular:  Negative for chest pain, palpitations and leg swelling.  ?Gastrointestinal: Negative.   ?Skin:  Positive for rash.  ?Neurological: Negative.   ?Psychiatric/Behavioral: Negative.    ? ?Per HPI unless specifically indicated above ? ?   ?Objective:  ?  ?BP 138/88 (BP Location: Left Arm)   Pulse 70   Temp 98.5 ?F (36.9 ?C) (Oral)   Wt 177 lb 12.8 oz (80.6 kg)   SpO2 96%   BMI 34.72 kg/m?   ?Wt Readings from Last 3 Encounters:  ?10/27/21 177 lb 12.8 oz (80.6 kg)  ?10/20/21 177 lb 6.4 oz (80.5 kg)  ?10/04/21 175 lb 0.7 oz (79.4 kg)  ?  ?Physical Exam ?Vitals and nursing note reviewed.  ?Constitutional:   ?   General: She is awake. She is not in acute distress. ?   Appearance: She is well-developed and overweight. She is not ill-appearing.  ?HENT:  ?   Head: Normocephalic.  ?   Right Ear: Hearing normal.  ?   Left Ear: Hearing normal.  ?   Mouth/Throat:  ?   Mouth: Mucous membranes are moist.  ?   Comments: Whitish coating noted to tongue. ?Eyes:  ?   General: Lids are normal.     ?   Right eye: No discharge.     ?   Left eye: No discharge.  ?   Conjunctiva/sclera: Conjunctivae normal.  ?  Pupils: Pupils are equal, round, and reactive to light.  ?Neck:  ?   Vascular: No carotid bruit.  ?Cardiovascular:  ?   Rate and Rhythm: Normal rate and regular rhythm.  ?   Heart sounds: Normal heart sounds. No murmur heard. ?  No gallop.  ?Pulmonary:  ?   Effort: Pulmonary effort is normal. No accessory muscle usage or respiratory distress.  ?   Breath sounds: Normal breath sounds.  ?Abdominal:  ?   General: Bowel sounds are normal.  ?   Palpations: Abdomen is soft.  ?Musculoskeletal:  ?   Cervical back: Normal range of motion and neck supple.  ?   Right lower leg: No edema.  ?   Left lower leg: No edema.   ?Skin: ?   General: Skin is warm and dry.  ?   Findings: Rash present.  ? ?    ?   Comments: Remainder of past rashes to left upper arm and chest have improved.  ?Neurological:  ?   Mental Status: She is alert and oriented to person, place, and time.  ?Psychiatric:     ?   Attention and Perception: Attention normal.     ?   Mood and Affect: Mood normal.     ?   Speech: Speech normal.     ?   Behavior: Behavior normal. Behavior is cooperative.     ?   Thought Content: Thought content normal.  ? ? ?Results for orders placed or performed in visit on 07/13/21  ?WET PREP FOR [place], YEAST, CLUE  ? Urine  ?Result Value Ref Range  ? Trichomonas Exam Negative Negative  ? Yeast Exam Negative Negative  ? Clue Cell Exam Positive (A) Negative  ?Gamma GT  ?Result Value Ref Range  ? GGT 197 (H) 0 - 60 IU/L  ?Comprehensive metabolic panel  ?Result Value Ref Range  ? Glucose 87 70 - 99 mg/dL  ? BUN 15 8 - 27 mg/dL  ? Creatinine, Ser 0.86 0.57 - 1.00 mg/dL  ? eGFR 77 >59 mL/min/1.73  ? BUN/Creatinine Ratio 17 12 - 28  ? Sodium 142 134 - 144 mmol/L  ? Potassium 4.4 3.5 - 5.2 mmol/L  ? Chloride 105 96 - 106 mmol/L  ? CO2 22 20 - 29 mmol/L  ? Calcium 9.7 8.7 - 10.3 mg/dL  ? Total Protein 7.1 6.0 - 8.5 g/dL  ? Albumin 4.5 3.8 - 4.8 g/dL  ? Globulin, Total 2.6 1.5 - 4.5 g/dL  ? Albumin/Globulin Ratio 1.7 1.2 - 2.2  ? Bilirubin Total 0.5 0.0 - 1.2 mg/dL  ? Alkaline Phosphatase 109 44 - 121 IU/L  ? AST 24 0 - 40 IU/L  ? ALT 33 (H) 0 - 32 IU/L  ?Lipid Panel w/o Chol/HDL Ratio  ?Result Value Ref Range  ? Cholesterol, Total 271 (H) 100 - 199 mg/dL  ? Triglycerides 216 (H) 0 - 149 mg/dL  ? HDL 40 >39 mg/dL  ? VLDL Cholesterol Cal 41 (H) 5 - 40 mg/dL  ? LDL Chol Calc (NIH) 190 (H) 0 - 99 mg/dL  ? Lipid Comment: Comment   ?CBC with Differential/Platelet  ?Result Value Ref Range  ? WBC 7.8 3.4 - 10.8 x10E3/uL  ? RBC 5.72 (H) 3.77 - 5.28 x10E6/uL  ? Hemoglobin 16.8 (H) 11.1 - 15.9 g/dL  ? Hematocrit 48.2 (H) 34.0 - 46.6 %  ? MCV 84 79 - 97 fL  ?  MCH 29.4 26.6 - 33.0 pg  ? MCHC 34.9  31.5 - 35.7 g/dL  ? RDW 12.9 11.7 - 15.4 %  ? Platelets 232 150 - 450 x10E3/uL  ? Neutrophils 44 Not Estab. %  ? Lymphs 44 Not Estab. %  ? Monocytes 9 Not Estab. %  ? Eos 2 Not Estab. %  ? Basos 1 Not Estab. %  ? Neutrophils Absolute 3.4 1.4 - 7.0 x10E3/uL  ? Lymphocytes Absolute 3.5 (H) 0.7 - 3.1 x10E3/uL  ? Monocytes Absolute 0.7 0.1 - 0.9 x10E3/uL  ? EOS (ABSOLUTE) 0.2 0.0 - 0.4 x10E3/uL  ? Basophils Absolute 0.1 0.0 - 0.2 x10E3/uL  ? Immature Granulocytes 0 Not Estab. %  ? Immature Grans (Abs) 0.0 0.0 - 0.1 x10E3/uL  ?Urinalysis, Routine w reflex microscopic  ?Result Value Ref Range  ? Specific Gravity, UA 1.020 1.005 - 1.030  ? pH, UA 5.5 5.0 - 7.5  ? Color, UA Yellow Yellow  ? Appearance Ur Clear Clear  ? Leukocytes,UA Negative Negative  ? Protein,UA Negative Negative/Trace  ? Glucose, UA Negative Negative  ? Ketones, UA Negative Negative  ? RBC, UA Negative Negative  ? Bilirubin, UA Negative Negative  ? Urobilinogen, Ur 0.2 0.2 - 1.0 mg/dL  ? Nitrite, UA Negative Negative  ? ?   ?Assessment & Plan:  ? ?Problem List Items Addressed This Visit   ? ?  ? Cardiovascular and Mediastinum  ? Essential hypertension, benign  ?  Chronic, with BP at goal today on recheck - elevations at home with Prednisone on board.  Recommend she monitor BP at least a few mornings a week at home and document.  DASH diet at home.  Continue current medication regimen and adjust as needed, may be able to reduce Amlodipine back down after Prednisone complete.   ? ?  ?  ?  ? Musculoskeletal and Integument  ? Contact dermatitis - Primary  ?  Acute and improving at this time -- recommend she finish out current prescription treatment and will send in refill on steroid cream.  Plan on return in 2 weeks for follow-up. ? ?  ?  ? ?Other Visit Diagnoses   ? ? Thrush, oral      ? Will treat with Nystatin swish and swallow.   ? Relevant Medications  ? nystatin (MYCOSTATIN) 100000 UNIT/ML suspension  ? ?  ?   ? ?Follow up plan: ?Return in about 2 weeks (around 11/10/2021) for RASH AND HTN. ? ? ? ? ? ?

## 2021-10-27 NOTE — Assessment & Plan Note (Signed)
Acute and improving at this time -- recommend she finish out current prescription treatment and will send in refill on steroid cream.  Plan on return in 2 weeks for follow-up. ?

## 2021-11-10 ENCOUNTER — Encounter: Payer: Self-pay | Admitting: Nurse Practitioner

## 2021-11-10 ENCOUNTER — Ambulatory Visit (INDEPENDENT_AMBULATORY_CARE_PROVIDER_SITE_OTHER): Payer: Medicare Other | Admitting: Nurse Practitioner

## 2021-11-10 VITALS — BP 120/84 | HR 90 | Temp 97.8°F | Ht 60.0 in | Wt 174.0 lb

## 2021-11-10 DIAGNOSIS — L2489 Irritant contact dermatitis due to other agents: Secondary | ICD-10-CM | POA: Diagnosis not present

## 2021-11-10 DIAGNOSIS — I10 Essential (primary) hypertension: Secondary | ICD-10-CM

## 2021-11-10 NOTE — Assessment & Plan Note (Signed)
Acute and improving at this time -- recommend she continue steroid cream as needed and return to office if any worsening of rash presents.

## 2021-11-10 NOTE — Assessment & Plan Note (Signed)
Chronic, with BP at goal today now is off Prednisone.  Recommend she monitor BP at least a few mornings a week at home and document.  DASH diet at home.  Continue current medication regimen and adjust as needed, may be able to reduce Amlodipine back down in future now that Prednisone complete.  Return in July as scheduled.

## 2021-11-10 NOTE — Progress Notes (Signed)
BP 120/84   Pulse 90   Temp 97.8 F (36.6 C) (Oral)   Ht 5' (1.524 m)   Wt 174 lb (78.9 kg)   SpO2 96%   BMI 33.98 kg/m    Subjective:    Patient ID: Allison Pham, female    DOB: 1961-04-07, 61 y.o.   MRN: [id number]  HPI: Allison Pham is a 61 y.o. female  Chief Complaint  Patient presents with   Hypertension   Rash    Patient is here to follow up on a Rash. Patient she is doing better, but the rash is taking it's time to clear.    HYPERTENSION Follow-up for BP check.  Amlodipine increased to 10 MG at visit 10/20/21 due to elevations with Prednisone on board, continues on Losartan 10 MG daily with this.  Continues to not smoke, has not gone to lung screening since 2019.   Hypertension status: stable  Satisfied with current treatment? yes Duration of hypertension: chronic BP monitoring frequency:  daily BP range: 120-130/80 range BP medication side effects:  no Medication compliance: good compliance Aspirin: yes Recurrent headaches: no Visual changes: no Palpitations: no Dyspnea: she does occasionally snore at night and reports occasional pauses in breathing -- we discussed sleep study, she wishes hold off for now Chest pain: no Lower extremity edema: no Dizzy/lightheaded: no   RASH Follow-up today for contact dermatitis, was treated on 10/20/21 with Zyrtec, Pepcid, and Prednisone.  Overall rash is improving with mild amount of rash right forearm and bumps to right upper arm.   Duration:  weeks  Location: arms  Itching: no Burning: no Redness: yes Oozing: no Scaling: yes Blisters: no Painful: no Fevers: no Change in detergents/soaps/personal care products: no Recent illness: no Recent travel:yes History of same: no Context: better Alleviating factors: Prednisone, Pepcid, Cream Treatments attempted: as above Shortness of breath: no  Throat/tongue swelling: no Myalgias/arthralgias: no   Relevant past medical, surgical, family and social history  reviewed and updated as indicated. Interim medical history since our last visit reviewed. Allergies and medications reviewed and updated.  Review of Systems  Constitutional:  Negative for activity change, appetite change, diaphoresis, fatigue and fever.  Respiratory:  Negative for cough, chest tightness and shortness of breath.   Cardiovascular:  Negative for chest pain, palpitations and leg swelling.  Gastrointestinal: Negative.   Skin:  Positive for rash.  Neurological: Negative.   Psychiatric/Behavioral: Negative.     Per HPI unless specifically indicated above     Objective:    BP 120/84   Pulse 90   Temp 97.8 F (36.6 C) (Oral)   Ht 5' (1.524 m)   Wt 174 lb (78.9 kg)   SpO2 96%   BMI 33.98 kg/m   Wt Readings from Last 3 Encounters:  11/10/21 174 lb (78.9 kg)  10/27/21 177 lb 12.8 oz (80.6 kg)  10/20/21 177 lb 6.4 oz (80.5 kg)    Physical Exam Vitals and nursing note reviewed.  Constitutional:      General: She is awake. She is not in acute distress.    Appearance: She is well-developed and overweight. She is not ill-appearing.  HENT:     Head: Normocephalic.     Right Ear: Hearing normal.     Left Ear: Hearing normal.  Eyes:     General: Lids are normal.        Right eye: No discharge.        Left eye: No discharge.  Conjunctiva/sclera: Conjunctivae normal.     Pupils: Pupils are equal, round, and reactive to light.  Neck:     Vascular: No carotid bruit.  Cardiovascular:     Rate and Rhythm: Normal rate and regular rhythm.     Heart sounds: Normal heart sounds. No murmur heard.   No gallop.  Pulmonary:     Effort: Pulmonary effort is normal. No accessory muscle usage or respiratory distress.     Breath sounds: Normal breath sounds.  Abdominal:     General: Bowel sounds are normal.     Palpations: Abdomen is soft.  Musculoskeletal:     Cervical back: Normal range of motion and neck supple.     Right lower leg: No edema.     Left lower leg: No edema.   Skin:    General: Skin is warm and dry.     Findings: Rash present.          Comments: Remainder of past rashes to left upper arm and chest have improved.  Neurological:     Mental Status: She is alert and oriented to person, place, and time.  Psychiatric:        Attention and Perception: Attention normal.        Mood and Affect: Mood normal.        Speech: Speech normal.        Behavior: Behavior normal. Behavior is cooperative.        Thought Content: Thought content normal.    Results for orders placed or performed in visit on 07/13/21  WET PREP FOR [place], YEAST, CLUE   Urine  Result Value Ref Range   Trichomonas Exam Negative Negative   Yeast Exam Negative Negative   Clue Cell Exam Positive (A) Negative  Gamma GT  Result Value Ref Range   GGT 197 (H) 0 - 60 IU/L  Comprehensive metabolic panel  Result Value Ref Range   Glucose 87 70 - 99 mg/dL   BUN 15 8 - 27 mg/dL   Creatinine, Ser 0.86 0.57 - 1.00 mg/dL   eGFR 77 >59 mL/min/1.73   BUN/Creatinine Ratio 17 12 - 28   Sodium 142 134 - 144 mmol/L   Potassium 4.4 3.5 - 5.2 mmol/L   Chloride 105 96 - 106 mmol/L   CO2 22 20 - 29 mmol/L   Calcium 9.7 8.7 - 10.3 mg/dL   Total Protein 7.1 6.0 - 8.5 g/dL   Albumin 4.5 3.8 - 4.8 g/dL   Globulin, Total 2.6 1.5 - 4.5 g/dL   Albumin/Globulin Ratio 1.7 1.2 - 2.2   Bilirubin Total 0.5 0.0 - 1.2 mg/dL   Alkaline Phosphatase 109 44 - 121 IU/L   AST 24 0 - 40 IU/L   ALT 33 (H) 0 - 32 IU/L  Lipid Panel w/o Chol/HDL Ratio  Result Value Ref Range   Cholesterol, Total 271 (H) 100 - 199 mg/dL   Triglycerides 216 (H) 0 - 149 mg/dL   HDL 40 >39 mg/dL   VLDL Cholesterol Cal 41 (H) 5 - 40 mg/dL   LDL Chol Calc (NIH) 190 (H) 0 - 99 mg/dL   Lipid Comment: Comment   CBC with Differential/Platelet  Result Value Ref Range   WBC 7.8 3.4 - 10.8 x10E3/uL   RBC 5.72 (H) 3.77 - 5.28 x10E6/uL   Hemoglobin 16.8 (H) 11.1 - 15.9 g/dL   Hematocrit 48.2 (H) 34.0 - 46.6 %   MCV 84 79 - 97 fL    MCH 29.4 26.6 -  33.0 pg   MCHC 34.9 31.5 - 35.7 g/dL   RDW 12.9 11.7 - 15.4 %   Platelets 232 150 - 450 x10E3/uL   Neutrophils 44 Not Estab. %   Lymphs 44 Not Estab. %   Monocytes 9 Not Estab. %   Eos 2 Not Estab. %   Basos 1 Not Estab. %   Neutrophils Absolute 3.4 1.4 - 7.0 x10E3/uL   Lymphocytes Absolute 3.5 (H) 0.7 - 3.1 x10E3/uL   Monocytes Absolute 0.7 0.1 - 0.9 x10E3/uL   EOS (ABSOLUTE) 0.2 0.0 - 0.4 x10E3/uL   Basophils Absolute 0.1 0.0 - 0.2 x10E3/uL   Immature Granulocytes 0 Not Estab. %   Immature Grans (Abs) 0.0 0.0 - 0.1 x10E3/uL  Urinalysis, Routine w reflex microscopic  Result Value Ref Range   Specific Gravity, UA 1.020 1.005 - 1.030   pH, UA 5.5 5.0 - 7.5   Color, UA Yellow Yellow   Appearance Ur Clear Clear   Leukocytes,UA Negative Negative   Protein,UA Negative Negative/Trace   Glucose, UA Negative Negative   Ketones, UA Negative Negative   RBC, UA Negative Negative   Bilirubin, UA Negative Negative   Urobilinogen, Ur 0.2 0.2 - 1.0 mg/dL   Nitrite, UA Negative Negative      Assessment & Plan:   Problem List Items Addressed This Visit       Cardiovascular and Mediastinum   Essential hypertension, benign - Primary    Chronic, with BP at goal today now is off Prednisone.  Recommend she monitor BP at least a few mornings a week at home and document.  DASH diet at home.  Continue current medication regimen and adjust as needed, may be able to reduce Amlodipine back down in future now that Prednisone complete.  Return in July as scheduled.         Musculoskeletal and Integument   Contact dermatitis    Acute and improving at this time -- recommend she continue steroid cream as needed and return to office if any worsening of rash presents.           Follow up plan: Return for as scheduled 01/11/22 for physical.

## 2021-11-10 NOTE — Patient Instructions (Signed)

## 2022-01-11 ENCOUNTER — Encounter: Payer: Medicare Other | Admitting: Nurse Practitioner

## 2022-01-11 DIAGNOSIS — Z0000 Encounter for general adult medical examination without abnormal findings: Secondary | ICD-10-CM

## 2022-01-11 DIAGNOSIS — F411 Generalized anxiety disorder: Secondary | ICD-10-CM

## 2022-01-11 DIAGNOSIS — E785 Hyperlipidemia, unspecified: Secondary | ICD-10-CM

## 2022-01-11 DIAGNOSIS — F320 Major depressive disorder, single episode, mild: Secondary | ICD-10-CM

## 2022-01-11 DIAGNOSIS — R748 Abnormal levels of other serum enzymes: Secondary | ICD-10-CM

## 2022-01-11 DIAGNOSIS — J432 Centrilobular emphysema: Secondary | ICD-10-CM

## 2022-01-11 DIAGNOSIS — I10 Essential (primary) hypertension: Secondary | ICD-10-CM

## 2022-01-11 DIAGNOSIS — E669 Obesity, unspecified: Secondary | ICD-10-CM

## 2022-01-11 DIAGNOSIS — E559 Vitamin D deficiency, unspecified: Secondary | ICD-10-CM

## 2022-01-11 DIAGNOSIS — Z87891 Personal history of nicotine dependence: Secondary | ICD-10-CM

## 2022-01-27 NOTE — Patient Instructions (Signed)

## 2022-01-31 ENCOUNTER — Encounter: Payer: Self-pay | Admitting: Nurse Practitioner

## 2022-01-31 ENCOUNTER — Ambulatory Visit (INDEPENDENT_AMBULATORY_CARE_PROVIDER_SITE_OTHER): Payer: No Typology Code available for payment source | Admitting: Nurse Practitioner

## 2022-01-31 VITALS — BP 115/79 | HR 75 | Temp 97.7°F | Ht 62.0 in | Wt 178.0 lb

## 2022-01-31 DIAGNOSIS — I10 Essential (primary) hypertension: Secondary | ICD-10-CM

## 2022-01-31 DIAGNOSIS — Z87891 Personal history of nicotine dependence: Secondary | ICD-10-CM | POA: Diagnosis not present

## 2022-01-31 DIAGNOSIS — J432 Centrilobular emphysema: Secondary | ICD-10-CM

## 2022-01-31 DIAGNOSIS — Z0000 Encounter for general adult medical examination without abnormal findings: Secondary | ICD-10-CM

## 2022-01-31 DIAGNOSIS — J301 Allergic rhinitis due to pollen: Secondary | ICD-10-CM

## 2022-01-31 DIAGNOSIS — E559 Vitamin D deficiency, unspecified: Secondary | ICD-10-CM

## 2022-01-31 DIAGNOSIS — E785 Hyperlipidemia, unspecified: Secondary | ICD-10-CM

## 2022-01-31 DIAGNOSIS — E669 Obesity, unspecified: Secondary | ICD-10-CM | POA: Diagnosis not present

## 2022-01-31 DIAGNOSIS — E66811 Obesity, class 1: Secondary | ICD-10-CM

## 2022-01-31 DIAGNOSIS — L237 Allergic contact dermatitis due to plants, except food: Secondary | ICD-10-CM

## 2022-01-31 DIAGNOSIS — F320 Major depressive disorder, single episode, mild: Secondary | ICD-10-CM

## 2022-01-31 DIAGNOSIS — F411 Generalized anxiety disorder: Secondary | ICD-10-CM

## 2022-01-31 DIAGNOSIS — Z2821 Immunization not carried out because of patient refusal: Secondary | ICD-10-CM | POA: Diagnosis not present

## 2022-01-31 DIAGNOSIS — R748 Abnormal levels of other serum enzymes: Secondary | ICD-10-CM | POA: Diagnosis not present

## 2022-01-31 DIAGNOSIS — Z1231 Encounter for screening mammogram for malignant neoplasm of breast: Secondary | ICD-10-CM | POA: Diagnosis not present

## 2022-01-31 DIAGNOSIS — I6523 Occlusion and stenosis of bilateral carotid arteries: Secondary | ICD-10-CM | POA: Diagnosis not present

## 2022-01-31 MED ORDER — EZETIMIBE 10 MG PO TABS
10.0000 mg | ORAL_TABLET | Freq: Every day | ORAL | 4 refills | Status: DC
Start: 2022-01-31 — End: 2022-07-05

## 2022-01-31 MED ORDER — TRAZODONE HCL 50 MG PO TABS: 25.0000 mg | ORAL_TABLET | Freq: Every day | ORAL | 4 refills | Status: DC

## 2022-01-31 MED ORDER — PRAVASTATIN SODIUM 40 MG PO TABS
40.0000 mg | ORAL_TABLET | Freq: Every day | ORAL | 4 refills | Status: DC
Start: 2022-01-31 — End: 2022-01-31

## 2022-01-31 MED ORDER — LORATADINE 10 MG PO TABS: 10.0000 mg | ORAL_TABLET | Freq: Every day | ORAL | 11 refills | Status: DC | PRN

## 2022-01-31 MED ORDER — PRAVASTATIN SODIUM 40 MG PO TABS: 40.0000 mg | ORAL_TABLET | Freq: Every day | ORAL | 4 refills | Status: DC

## 2022-01-31 MED ORDER — TRIAMCINOLONE ACETONIDE 0.1 % EX CREA: 1.0000 | TOPICAL_CREAM | Freq: Two times a day (BID) | CUTANEOUS | 0 refills | Status: DC

## 2022-01-31 MED ORDER — VITAMIN D (ERGOCALCIFEROL) 1.25 MG (50000 UNIT) PO CAPS: 50000.0000 [IU] | ORAL_CAPSULE | ORAL | 4 refills | Status: DC

## 2022-01-31 MED ORDER — AMLODIPINE BESYLATE 5 MG PO TABS: 10.0000 mg | ORAL_TABLET | Freq: Every day | ORAL | 4 refills | Status: DC

## 2022-01-31 MED ORDER — BUSPIRONE HCL 5 MG PO TABS: 2.5000 mg | ORAL_TABLET | Freq: Three times a day (TID) | ORAL | 4 refills | Status: DC | PRN

## 2022-01-31 MED ORDER — LOSARTAN POTASSIUM 100 MG PO TABS: 100.0000 mg | ORAL_TABLET | Freq: Every day | ORAL | 4 refills | Status: DC

## 2022-01-31 MED ORDER — PREDNISONE 10 MG PO TABS: ORAL_TABLET | ORAL | 0 refills | Status: DC

## 2022-01-31 NOTE — Assessment & Plan Note (Signed)
Chronic, stable with no inhalers.  Will plan on spirometry next visit.  If worsening symptoms initiate inhaler regimen.  Continue yearly lung screening -- recommend she call to schedule this and pamphlet provided.  Referral placed last visit.

## 2022-01-31 NOTE — Assessment & Plan Note (Signed)
Check CMP and GGT today. 

## 2022-01-31 NOTE — Assessment & Plan Note (Signed)
Recommend she continue yearly lung screening and to schedule this, provided pamphlet with number to call and schedule.  Referral placed last visit.

## 2022-01-31 NOTE — Assessment & Plan Note (Signed)
BMI 32.56 with difficulty losing, even with 6 months of diet and exercise focus. Recommended eating smaller high protein, low fat meals more frequently and exercising 30 mins a day 5 times a week with a goal of 10-15lb weight loss in the next 3 months. Patient voiced their understanding and motivation to adhere to these recommendations.  Will see if we can get [facility] for patient in future.

## 2022-01-31 NOTE — Assessment & Plan Note (Signed)
Refuses this and Covid vaccinations.

## 2022-01-31 NOTE — Assessment & Plan Note (Signed)
Chronic, ongoing.  Continue current medication regimen and adjust as needed, monitor LFT as has history of elevation.  Recommend she take both Zetia and Pravastatin.  May consider adjusting statin regimen if ongoing elevations due to family history, could consider Praluent or Repatha. 

## 2022-01-31 NOTE — Assessment & Plan Note (Signed)
Chronic, stable.  Continue Claritin daily, as offers benefit.  Consider allergist referral if worsening symptoms present.

## 2022-01-31 NOTE — Assessment & Plan Note (Signed)
Chronic, ongoing.  Denies SI/HI.  Reports benefit from current regimen, will continue this and send refills as needed.  Continue therapy visits as needed.  Return in 6 months. 

## 2022-01-31 NOTE — Assessment & Plan Note (Signed)
Refer to depression plan of care. 

## 2022-01-31 NOTE — Assessment & Plan Note (Signed)
Chronic, with BP at goal today in office.  Recommend she monitor BP at least a few mornings a week at home and document.  DASH diet at home.  Continue current medication regimen and adjust as needed, may be able to reduce Amlodipine back down in future if BP remains stable.  Labs: CBC, CMP, TSH.  Refills sent.  Return in 6 months.

## 2022-01-31 NOTE — Progress Notes (Signed)
BP 115/79   Pulse 75   Temp 97.7 F (36.5 C) (Oral)   Ht _0  (1.575 m)   Wt 178 lb (80.7 kg)   SpO2 96%   BMI 32.56 kg/m    Subjective:    Patient ID: Allison Pham, female    DOB: 1961-03-09, 61 y.o.   MRN: [id number]  HPI: Allison Pham is a 61 y.o. female presenting on 01/31/2022 for comprehensive medical examination. Current medical complaints include:none  She currently lives with: daughter Menopausal Symptoms: no  HYPERTENSION/HLD Currently taking Amlodipine 5 MG and Losartan 100 MG daily. Taking Zetia 10 MG and Pravastatin 40 MG daily for HLD.  Satisfied with current treatment? yes Duration of hypertension: chronic BP monitoring frequency: daily BP range: 120-130/70-80 range BP medication side effects: no Past BP meds: multiple kinds Duration of hyperlipidemia: chronic Cholesterol medication side effects: no Cholesterol supplements: none Past cholesterol medications: multiple different kinds Medication compliance: good compliance Aspirin: no Recent stressors: no Recurrent headaches: no Visual changes: no Palpitations: no Dyspnea: no Chest pain: no Lower extremity edema: no Dizzy/lightheaded: no  The 10-year ASCVD risk score (Arnett DK, et al., 2019) is: 6.5%   Values used to calculate the score:     Age: 35 years     Sex: Female     Is Non-Hispanic African American: No     Diabetic: No     Tobacco smoker: No     Systolic Blood Pressure: 678 mmHg     Is BP treated: Yes     HDL Cholesterol: 40 mg/dL     Total Cholesterol: 271 mg/dL  COPD Centrilobular mild noted on lung CT 2019.  She is a past smoker -- off and on, was a 1 PPD smoker in past.  Quit >5 years ago.  No current symptoms or inhalers.   COPD status: stable Satisfied with current treatment?: yes Oxygen use: no Dyspnea frequency:  none Cough frequency: none Limitation of activity: no Productive cough: none Last Spirometry: unknown Pneumovax:  refuses Influenza:  refuses  RASH From  poison oak out in yard.   Duration:  days  Location: generalized  Itching: no Burning: no Redness: yes Oozing: no Scaling: yes Blisters: no Painful: no Fevers: no Change in detergents/soaps/personal care products: no Recent illness: no Recent travel:no History of same: yes Context: stable Alleviating factors: hydrocortisone cream and benadryl Treatments attempted:lotion/moisturizer Shortness of breath: no  Throat/tongue swelling: no Myalgias/arthralgias: no    VITAMIN D DEFICIENCY: Continues on Vitamin D every day -- this offers benefit to mood and overall energy she reports.  No recent falls or fractures.  No muscle aches.    DEPRESSION Continues on Trazodone 25 MG at night, takes only as needed. Does have Buspar at home -- takes this daily with benefit. Mood status: controlled Satisfied with current treatment?: yes Symptom severity: mild  Duration of current treatment : chronic Side effects: no Medication compliance: good compliance Psychotherapy/counseling: none Previous psychiatric medications:  Depressed mood: no Anxious mood: no Anhedonia: no Significant weight loss or gain: no Insomnia: yes hard to fall asleep Fatigue: no Feelings of worthlessness or guilt: no Impaired concentration/indecisiveness: no Suicidal ideations: no Hopelessness: no Crying spells: no    01/31/2022    9:25 AM 11/10/2021   10:41 AM 10/27/2021   10:54 AM 10/20/2021   11:19 AM 07/13/2021    2:33 PM  Depression screen PHQ 2/9  Decreased Interest _1 Down, Depressed, Hopeless _2 1  2  PHQ - 2 Score _0 Altered sleeping 1 1 0 1 1  Tired, decreased energy _1 Change in appetite 1 0 1 0 0  Feeling bad or failure about yourself  1 0 _2 Trouble concentrating 1 0 0 0 1  Moving slowly or fidgety/restless 0 0 0 0 0  Suicidal thoughts 0 0 0 0 0  PHQ-9 Score _3 Difficult doing work/chores Not difficult at all  Somewhat difficult         01/31/2022    9:26  AM 11/10/2021   10:42 AM 10/27/2021   10:55 AM 10/20/2021   11:19 AM  GAD 7 : Generalized Anxiety Score  Nervous, Anxious, on Edge _4 Control/stop worrying _5 Worry too much - different things _6 Trouble relaxing _7 Restless 0 2 0 0  Easily annoyed or irritable 0 2 1 0  Afraid - awful might happen 0 1 0 0  Total GAD 7 Score _8 Anxiety Difficulty Somewhat difficult Not difficult at all Somewhat difficult    Functional Status Survey: Is the patient deaf or have difficulty hearing?: No Does the patient have difficulty seeing, even when wearing glasses/contacts?: No Does the patient have difficulty concentrating, remembering, or making decisions?: No Does the patient have difficulty walking or climbing stairs?: No Does the patient have difficulty dressing or bathing?: No Does the patient have difficulty doing errands alone such as visiting a doctor's office or shopping?: No      10/04/2021    3:26 PM 10/27/2021   10:54 AM 11/10/2021   10:41 AM 01/31/2022    9:26 AM 01/31/2022   10:03 AM  Fall Risk  Falls in the past year?  0 0 0 0  Was there an injury with Fall?  0 0 0 0  Fall Risk Category Calculator  0 0 0 0  Fall Risk Category  Low Low Low Low  Patient Fall Risk Level _9   Patient at Risk for Falls Due to  No Fall Risks No Fall Risks No Fall Risks No Fall Risks  Fall risk Follow up  Falls evaluation completed Falls evaluation completed Falls evaluation completed Falls evaluation completed    Past Medical History:  Past Medical History:  Diagnosis Date   Anxiety    Carotid atherosclerosis, bilateral 07/16/2018   rescan Jan 2021   Emphysema of lung ([place]) 03/21/2018   GERD (gastroesophageal reflux disease)    Hypercholesteremia    Hypertension    Tick bite    Vertigo    Wears dentures    partial upper    Surgical History:  Past Surgical History:  Procedure Laterality Date    ABDOMINAL HYSTERECTOMY     Partial; bladder suspension   COLONOSCOPY N/A 01/28/2021   Procedure: COLONOSCOPY;  Surgeon: [name], MD;  Location: [place];  Service: Endoscopy;  Laterality: N/A;   COLONOSCOPY WITH PROPOFOL N/A 05/10/2017   Procedure: COLONOSCOPY WITH PROPOFOL;  Surgeon: [name], MD;  Location: [place];  Service: Endoscopy;  Laterality: N/A;   HEMORRHOID SURGERY     POLYPECTOMY N/A 05/10/2017   Procedure: POLYPECTOMY;  Surgeon: [name], MD;  Location: [place];  Service: Endoscopy;  Laterality: N/A;  POLYPECTOMY  01/28/2021   Procedure: POLYPECTOMY;  Surgeon: [name], MD;  Location: [place];  Service: Endoscopy;;    Medications:  Current Outpatient Medications on File Prior to Visit  Medication Sig   aspirin 81 MG chewable tablet Chew 81 mg by mouth as needed.    No current facility-administered medications on file prior to visit.    Allergies:  Allergies  Allergen Reactions   Latex Rash   Penicillins Rash    Social History:  Social History   Socioeconomic History   Marital status: Single    Spouse name: Not on file   Number of children: Not on file   Years of education: Not on file   Highest education level: Not on file  Occupational History   Not on file  Tobacco Use   Smoking status: Former    Packs/day: 1.00    Years: 38.00    Total pack years: 38.00    Types: Cigarettes    Quit date: 09/13/2015    Years since quitting: 6.3   Smokeless tobacco: Never  Vaping Use   Vaping Use: Former  Substance and Sexual Activity   Alcohol use: Never    Comment: rare - holidays   Drug use: No   Sexual activity: Not Currently    Birth control/protection: Surgical  Other Topics Concern   Not on file  Social History Narrative   Not on file   Social Determinants of Health   Financial Resource Strain: Not on file  Food Insecurity: Not on file  Transportation Needs: Not on file  Physical  Activity: Not on file  Stress: Not on file  Social Connections: Not on file  Intimate Partner Violence: Not on file   Social History   Tobacco Use  Smoking Status Former   Packs/day: 1.00   Years: 38.00   Total pack years: 38.00   Types: Cigarettes   Quit date: 09/13/2015   Years since quitting: 6.3  Smokeless Tobacco Never   Social History   Substance and Sexual Activity  Alcohol Use Never   Comment: rare - holidays    Family History:  Family History  Problem Relation Age of Onset   Cancer Mother        Colon   Hypertension Mother    Hypertension Father    Heart attack Father    Heart disease Father    Hypertension Sister    COPD Sister    Hypertension Brother    Heart disease Brother    Cancer Maternal Aunt    Breast cancer Maternal Aunt    Cancer Maternal Uncle    Breast cancer Maternal Aunt    Diabetes Neg Hx    Stroke Neg Hx     Past medical history, surgical history, medications, allergies, family history and social history reviewed with patient today and changes made to appropriate areas of the chart.   ROS All other ROS negative except what is listed above and in the HPI.      Objective:    BP 115/79   Pulse 75   Temp 97.7 F (36.5 C) (Oral)   Ht _0  (1.575 m)   Wt 178 lb (80.7 kg)   SpO2 96%   BMI 32.56 kg/m   Wt Readings from Last 3 Encounters:  01/31/22 178 lb (80.7 kg)  11/10/21 174 lb (78.9 kg)  10/27/21 177 lb 12.8 oz (80.6 kg)    Physical Exam Vitals and nursing note reviewed. Exam conducted with a chaperone  present.  Constitutional:      General: She is awake. She is not in acute distress.    Appearance: She is well-developed. She is obese. She is not ill-appearing or toxic-appearing.  HENT:     Head: Normocephalic and atraumatic.     Right Ear: Hearing, tympanic membrane, ear canal and external ear normal. No drainage.     Left Ear: Hearing, tympanic membrane, ear canal and external ear normal. No drainage.     Nose: Nose  normal.     Right Sinus: No maxillary sinus tenderness or frontal sinus tenderness.     Left Sinus: No maxillary sinus tenderness or frontal sinus tenderness.     Mouth/Throat:     Mouth: Mucous membranes are moist.     Pharynx: Oropharynx is clear. Uvula midline. No pharyngeal swelling, oropharyngeal exudate or posterior oropharyngeal erythema.  Eyes:     General: Lids are normal.        Right eye: No discharge.        Left eye: No discharge.     Extraocular Movements: Extraocular movements intact.     Conjunctiva/sclera: Conjunctivae normal.     Pupils: Pupils are equal, round, and reactive to light.     Visual Fields: Right eye visual fields normal and left eye visual fields normal.  Neck:     Thyroid: No thyromegaly.     Vascular: No carotid bruit.     Trachea: Trachea normal.  Cardiovascular:     Rate and Rhythm: Normal rate and regular rhythm.     Heart sounds: Normal heart sounds. No murmur heard.    No gallop.  Pulmonary:     Effort: Pulmonary effort is normal. No accessory muscle usage or respiratory distress.     Breath sounds: Normal breath sounds.  Abdominal:     General: Bowel sounds are normal.     Palpations: Abdomen is soft. There is no hepatomegaly or splenomegaly.     Tenderness: There is no abdominal tenderness.  Musculoskeletal:        General: Normal range of motion.     Cervical back: Normal range of motion and neck supple.     Right lower leg: No edema.     Left lower leg: No edema.  Lymphadenopathy:     Head:     Right side of head: No submental, submandibular, tonsillar, preauricular or posterior auricular adenopathy.     Left side of head: No submental, submandibular, tonsillar, preauricular or posterior auricular adenopathy.     Cervical: No cervical adenopathy.  Skin:    General: Skin is warm and dry.     Capillary Refill: Capillary refill takes less than 2 seconds.     Findings: Rash present.     Comments: Linear pattern rash to upper left arm  and patches to lower right arm with erythema, raised, small blister present to upper left arm.  No warmth or tenderness.  Neurological:     Mental Status: She is alert and oriented to person, place, and time.     Gait: Gait is intact.     Deep Tendon Reflexes: Reflexes are normal and symmetric.     Reflex Scores:      Brachioradialis reflexes are 2+ on the right side and 2+ on the left side.      Patellar reflexes are 2+ on the right side and 2+ on the left side. Psychiatric:        Attention and Perception: Attention normal.  Mood and Affect: Mood normal.        Speech: Speech normal.        Behavior: Behavior normal. Behavior is cooperative.        Thought Content: Thought content normal.        Judgment: Judgment normal.    Results for orders placed or performed in visit on 07/13/21  WET PREP FOR [place], YEAST, CLUE   Urine  Result Value Ref Range   Trichomonas Exam Negative Negative   Yeast Exam Negative Negative   Clue Cell Exam Positive (A) Negative  Gamma GT  Result Value Ref Range   GGT 197 (H) 0 - 60 IU/L  Comprehensive metabolic panel  Result Value Ref Range   Glucose 87 70 - 99 mg/dL   BUN 15 8 - 27 mg/dL   Creatinine, Ser 0.86 0.57 - 1.00 mg/dL   eGFR 77 >59 mL/min/1.73   BUN/Creatinine Ratio 17 12 - 28   Sodium 142 134 - 144 mmol/L   Potassium 4.4 3.5 - 5.2 mmol/L   Chloride 105 96 - 106 mmol/L   CO2 22 20 - 29 mmol/L   Calcium 9.7 8.7 - 10.3 mg/dL   Total Protein 7.1 6.0 - 8.5 g/dL   Albumin 4.5 3.8 - 4.8 g/dL   Globulin, Total 2.6 1.5 - 4.5 g/dL   Albumin/Globulin Ratio 1.7 1.2 - 2.2   Bilirubin Total 0.5 0.0 - 1.2 mg/dL   Alkaline Phosphatase 109 44 - 121 IU/L   AST 24 0 - 40 IU/L   ALT 33 (H) 0 - 32 IU/L  Lipid Panel w/o Chol/HDL Ratio  Result Value Ref Range   Cholesterol, Total 271 (H) 100 - 199 mg/dL   Triglycerides 216 (H) 0 - 149 mg/dL   HDL 40 >39 mg/dL   VLDL Cholesterol Cal 41 (H) 5 - 40 mg/dL   LDL Chol Calc (NIH) 190 (H) 0 - 99  mg/dL   Lipid Comment: Comment   CBC with Differential/Platelet  Result Value Ref Range   WBC 7.8 3.4 - 10.8 x10E3/uL   RBC 5.72 (H) 3.77 - 5.28 x10E6/uL   Hemoglobin 16.8 (H) 11.1 - 15.9 g/dL   Hematocrit 48.2 (H) 34.0 - 46.6 %   MCV 84 79 - 97 fL   MCH 29.4 26.6 - 33.0 pg   MCHC 34.9 31.5 - 35.7 g/dL   RDW 12.9 11.7 - 15.4 %   Platelets 232 150 - 450 x10E3/uL   Neutrophils 44 Not Estab. %   Lymphs 44 Not Estab. %   Monocytes 9 Not Estab. %   Eos 2 Not Estab. %   Basos 1 Not Estab. %   Neutrophils Absolute 3.4 1.4 - 7.0 x10E3/uL   Lymphocytes Absolute 3.5 (H) 0.7 - 3.1 x10E3/uL   Monocytes Absolute 0.7 0.1 - 0.9 x10E3/uL   EOS (ABSOLUTE) 0.2 0.0 - 0.4 x10E3/uL   Basophils Absolute 0.1 0.0 - 0.2 x10E3/uL   Immature Granulocytes 0 Not Estab. %   Immature Grans (Abs) 0.0 0.0 - 0.1 x10E3/uL  Urinalysis, Routine w reflex microscopic  Result Value Ref Range   Specific Gravity, UA 1.020 1.005 - 1.030   pH, UA 5.5 5.0 - 7.5   Color, UA Yellow Yellow   Appearance Ur Clear Clear   Leukocytes,UA Negative Negative   Protein,UA Negative Negative/Trace   Glucose, UA Negative Negative   Ketones, UA Negative Negative   RBC, UA Negative Negative   Bilirubin, UA Negative Negative   Urobilinogen, Ur 0.2  0.2 - 1.0 mg/dL   Nitrite, UA Negative Negative      Assessment & Plan:   Problem List Items Addressed This Visit       Cardiovascular and Mediastinum   Essential hypertension, benign    Chronic, with BP at goal today in office.  Recommend she monitor BP at least a few mornings a week at home and document.  DASH diet at home.  Continue current medication regimen and adjust as needed, may be able to reduce Amlodipine back down in future if BP remains stable.  Labs: CBC, CMP, TSH.  Refills sent.  Return in 6 months.      Relevant Medications   ezetimibe (ZETIA) 10 MG tablet   amLODipine (NORVASC) 5 MG tablet   losartan (COZAAR) 100 MG tablet   pravastatin (PRAVACHOL) 40 MG tablet    Other Relevant Orders   Comprehensive metabolic panel   TSH     Respiratory   Allergic rhinitis    Chronic, stable.  Continue Claritin daily, as offers benefit.  Consider allergist referral if worsening symptoms present.      Centrilobular emphysema (HCC) - Primary    Chronic, stable with no inhalers.  Will plan on spirometry next visit.  If worsening symptoms initiate inhaler regimen.  Continue yearly lung screening -- recommend she call to schedule this and pamphlet provided.  Referral placed last visit.      Relevant Medications   loratadine (CLARITIN) 10 MG tablet   predniSONE (DELTASONE) 10 MG tablet   Other Relevant Orders   CBC with Differential/Platelet     Other   Depression, major, single episode, mild (HCC)    Chronic, ongoing.  Denies SI/HI.  Reports benefit from current regimen, will continue this and send refills as needed.  Continue therapy visits as needed.  Return in 6 months.      Relevant Medications   busPIRone (BUSPAR) 5 MG tablet   traZODone (DESYREL) 50 MG tablet   Dyslipidemia    Chronic, ongoing.  Continue current medication regimen and adjust as needed, monitor LFT as has history of elevation.  Recommend she take both Zetia and Pravastatin.  May consider adjusting statin regimen if ongoing elevations due to family history, could consider Praluent or Repatha.      Relevant Medications   ezetimibe (ZETIA) 10 MG tablet   pravastatin (PRAVACHOL) 40 MG tablet   Other Relevant Orders   Comprehensive metabolic panel   Lipid Panel w/o Chol/HDL Ratio   Elevated serum GGT level    Check CMP and GGT today.      Relevant Orders   Gamma GT   Generalized anxiety disorder    Refer to depression plan of care.      Relevant Medications   busPIRone (BUSPAR) 5 MG tablet   traZODone (DESYREL) 50 MG tablet   Obesity (BMI 30.0-34.9)    BMI 32.56 with difficulty losing, even with 6 months of diet and exercise focus. Recommended eating smaller high protein, low  fat meals more frequently and exercising 30 mins a day 5 times a week with a goal of 10-15lb weight loss in the next 3 months. Patient voiced their understanding and motivation to adhere to these recommendations.  Will see if we can get [facility] for patient in future.        Smoking history    Recommend she continue yearly lung screening and to schedule this, provided pamphlet with number to call and schedule.  Referral placed last visit.  Relevant Orders   CBC with Differential/Platelet   Vitamin D deficiency    Ongoing and stable.  Continue supplement and check Vit D level today.      Relevant Orders   VITAMIN D 25 Hydroxy (Vit-D Deficiency, Fractures)   Other Visit Diagnoses     Poison oak dermatitis       Acute, send in Prednisone taper and Triamcinolone cream.  Recommend she avoid trigger areas in yard or spray areas of growth.   Encounter for screening mammogram for malignant neoplasm of breast       Mammogram ordered   Relevant Orders   MM 3D [place]   Encounter for annual physical exam        Annual physical today with labs and health maintenance reviewed, discussed with patient.        Follow up plan: Return in about 3 months (around 05/03/2022) for HTN/HLD, MOOD, COPD -- consider weight loss medications.   LABORATORY TESTING:  - Pap smear: up to date  IMMUNIZATIONS:   - Tdap: Tetanus vaccination status reviewed: last tetanus booster within 10 years. - Influenza: Refuses - Pneumovax: Not applicable - Prevnar: Not applicable - COVID: Refuses - HPV: Not applicable - Shingrix vaccine: Up to date  SCREENING: -Mammogram: Ordered today  - Colonoscopy: Up to date  - Bone Density: Not applicable  -Hearing Test: Not applicable  -Spirometry:  will obtain at future    PATIENT COUNSELING:   Advised to take 1 mg of folate supplement per day if capable of pregnancy.   Sexuality: Discussed sexually transmitted diseases, partner selection, use of  condoms, avoidance of unintended pregnancy  and contraceptive alternatives.   Advised to avoid cigarette smoking.  I discussed with the patient that most people either abstain from alcohol or drink within safe limits (<=14/week and <=4 drinks/occasion for males, <=7/weeks and <= 3 drinks/occasion for females) and that the risk for alcohol disorders and other health effects rises proportionally with the number of drinks per week and how often a drinker exceeds daily limits.  Discussed cessation/primary prevention of drug use and availability of treatment for abuse.   Diet: Encouraged to adjust caloric intake to maintain  or achieve ideal body weight, to reduce intake of dietary saturated fat and total fat, to limit sodium intake by avoiding high sodium foods and not adding table salt, and to maintain adequate dietary potassium and calcium preferably from fresh fruits, vegetables, and low-fat dairy products.    Stressed the importance of regular exercise  Injury prevention: Discussed safety belts, safety helmets, smoke detector, smoking near bedding or upholstery.   Dental health: Discussed importance of regular tooth brushing, flossing, and dental visits.    NEXT PREVENTATIVE PHYSICAL DUE IN 1 YEAR. Return in about 3 months (around 05/03/2022) for HTN/HLD, MOOD, COPD -- consider weight loss medications.

## 2022-01-31 NOTE — Assessment & Plan Note (Signed)
Ongoing and stable.  Continue supplement and check Vit D level today. 

## 2022-02-01 LAB — LIPID PANEL W/O CHOL/HDL RATIO
Cholesterol, Total: 273 mg/dL — ABNORMAL HIGH (ref 100–199)
HDL: 52 mg/dL
LDL Chol Calc (NIH): 190 mg/dL — ABNORMAL HIGH (ref 0–99)
Triglycerides: 168 mg/dL — ABNORMAL HIGH (ref 0–149)
VLDL Cholesterol Cal: 31 mg/dL (ref 5–40)

## 2022-02-01 LAB — COMPREHENSIVE METABOLIC PANEL WITH GFR
ALT: 33 IU/L — ABNORMAL HIGH (ref 0–32)
AST: 16 IU/L (ref 0–40)
Albumin/Globulin Ratio: 1.6 (ref 1.2–2.2)
Albumin: 4.3 g/dL (ref 3.9–4.9)
Alkaline Phosphatase: 100 IU/L (ref 44–121)
BUN/Creatinine Ratio: 19 (ref 12–28)
BUN: 18 mg/dL (ref 8–27)
Bilirubin Total: 0.4 mg/dL (ref 0.0–1.2)
CO2: 20 mmol/L (ref 20–29)
Calcium: 9.7 mg/dL (ref 8.7–10.3)
Chloride: 106 mmol/L (ref 96–106)
Creatinine, Ser: 0.95 mg/dL (ref 0.57–1.00)
Globulin, Total: 2.7 g/dL (ref 1.5–4.5)
Glucose: 90 mg/dL (ref 70–99)
Potassium: 4.3 mmol/L (ref 3.5–5.2)
Sodium: 142 mmol/L (ref 134–144)
Total Protein: 7 g/dL (ref 6.0–8.5)
eGFR: 68 mL/min/1.73

## 2022-02-01 LAB — CBC WITH DIFFERENTIAL/PLATELET
Basophils Absolute: 0.1 10*3/uL (ref 0.0–0.2)
Basos: 1 %
EOS (ABSOLUTE): 0.2 10*3/uL (ref 0.0–0.4)
Eos: 2 %
Hematocrit: 49.7 % — ABNORMAL HIGH (ref 34.0–46.6)
Hemoglobin: 16.5 g/dL — ABNORMAL HIGH (ref 11.1–15.9)
Immature Grans (Abs): 0 10*3/uL (ref 0.0–0.1)
Immature Granulocytes: 0 %
Lymphocytes Absolute: 4.2 10*3/uL — ABNORMAL HIGH (ref 0.7–3.1)
Lymphs: 40 %
MCH: 29 pg (ref 26.6–33.0)
MCHC: 33.2 g/dL (ref 31.5–35.7)
MCV: 88 fL (ref 79–97)
Monocytes Absolute: 0.9 10*3/uL (ref 0.1–0.9)
Monocytes: 9 %
Neutrophils Absolute: 4.9 10*3/uL (ref 1.4–7.0)
Neutrophils: 48 %
Platelets: 283 10*3/uL (ref 150–450)
RBC: 5.68 x10E6/uL — ABNORMAL HIGH (ref 3.77–5.28)
RDW: 13.4 % (ref 11.7–15.4)
WBC: 10.3 10*3/uL (ref 3.4–10.8)

## 2022-02-01 LAB — TSH: TSH: 2.26 u[IU]/mL (ref 0.450–4.500)

## 2022-02-01 LAB — VITAMIN D 25 HYDROXY (VIT D DEFICIENCY, FRACTURES): Vit D, 25-Hydroxy: 45.7 ng/mL (ref 30.0–100.0)

## 2022-02-01 LAB — GAMMA GT: GGT: 197 IU/L — ABNORMAL HIGH (ref 0–60)

## 2022-02-01 NOTE — Progress Notes (Signed)
Good afternoon Allison Pham, your labs have returned: - CBC continues to show mild elevation in hemoglobin and hematocrit + lymphocytes. We will continue to monitor this on labs.  Definitely continue to avoid smoking:) - Kidney function, creatinine and eGFR, remains normal. Liver function shows mild elevation in ALT, but normal AST.  GGT remains elevated, remind me do you still have gall bladder? - Cholesterol labs remain above goal.  Are you taking Pravastatin and Zetia daily? Were you fasting for these labs?  Let me know as we may change medication regimen since your liver testing is stable. - Thyroid testing and Vitamin D normal.  Any questions? Keep being stellar!!  Thank you for allowing me to participate in your care.  I appreciate you. Kindest regards, Yakub Lodes

## 2022-03-31 DIAGNOSIS — E669 Obesity, unspecified: Secondary | ICD-10-CM | POA: Diagnosis not present

## 2022-03-31 DIAGNOSIS — F419 Anxiety disorder, unspecified: Secondary | ICD-10-CM | POA: Diagnosis not present

## 2022-03-31 DIAGNOSIS — J439 Emphysema, unspecified: Secondary | ICD-10-CM | POA: Diagnosis not present

## 2022-03-31 DIAGNOSIS — Z86010 Personal history of colonic polyps: Secondary | ICD-10-CM | POA: Diagnosis not present

## 2022-03-31 DIAGNOSIS — F17211 Nicotine dependence, cigarettes, in remission: Secondary | ICD-10-CM | POA: Diagnosis not present

## 2022-03-31 DIAGNOSIS — I129 Hypertensive chronic kidney disease with stage 1 through stage 4 chronic kidney disease, or unspecified chronic kidney disease: Secondary | ICD-10-CM | POA: Diagnosis not present

## 2022-03-31 DIAGNOSIS — Z6831 Body mass index (BMI) 31.0-31.9, adult: Secondary | ICD-10-CM | POA: Diagnosis not present

## 2022-03-31 DIAGNOSIS — Z008 Encounter for other general examination: Secondary | ICD-10-CM | POA: Diagnosis not present

## 2022-03-31 DIAGNOSIS — N182 Chronic kidney disease, stage 2 (mild): Secondary | ICD-10-CM | POA: Diagnosis not present

## 2022-03-31 DIAGNOSIS — E785 Hyperlipidemia, unspecified: Secondary | ICD-10-CM | POA: Diagnosis not present

## 2022-04-30 NOTE — Patient Instructions (Signed)

## 2022-05-03 ENCOUNTER — Encounter: Payer: Self-pay | Admitting: Nurse Practitioner

## 2022-05-03 ENCOUNTER — Ambulatory Visit (INDEPENDENT_AMBULATORY_CARE_PROVIDER_SITE_OTHER): Payer: No Typology Code available for payment source | Admitting: Nurse Practitioner

## 2022-05-03 VITALS — BP 128/80 | HR 90 | Temp 97.6°F | Ht 62.0 in | Wt 178.4 lb

## 2022-05-03 DIAGNOSIS — J432 Centrilobular emphysema: Secondary | ICD-10-CM | POA: Diagnosis not present

## 2022-05-03 DIAGNOSIS — R748 Abnormal levels of other serum enzymes: Secondary | ICD-10-CM

## 2022-05-03 DIAGNOSIS — E559 Vitamin D deficiency, unspecified: Secondary | ICD-10-CM | POA: Diagnosis not present

## 2022-05-03 DIAGNOSIS — E785 Hyperlipidemia, unspecified: Secondary | ICD-10-CM | POA: Diagnosis not present

## 2022-05-03 DIAGNOSIS — F320 Major depressive disorder, single episode, mild: Secondary | ICD-10-CM

## 2022-05-03 DIAGNOSIS — F411 Generalized anxiety disorder: Secondary | ICD-10-CM

## 2022-05-03 DIAGNOSIS — I10 Essential (primary) hypertension: Secondary | ICD-10-CM | POA: Diagnosis not present

## 2022-05-03 DIAGNOSIS — E669 Obesity, unspecified: Secondary | ICD-10-CM | POA: Diagnosis not present

## 2022-05-03 MED ORDER — TRIAMCINOLONE ACETONIDE 0.1 % EX CREA
1.0000 | TOPICAL_CREAM | Freq: Two times a day (BID) | CUTANEOUS | 4 refills | Status: DC
Start: 2022-05-03 — End: 2022-10-23

## 2022-05-03 NOTE — Assessment & Plan Note (Signed)
BMI 32.63.Marland Kitchen Recommended eating smaller high protein, low fat meals more frequently and exercising 30 mins a day 5 times a week with a goal of 10-15lb weight loss in the next 3 months. Patient voiced their understanding and motivation to adhere to these recommendations.  Will see if we can get [facility] for patient in future.

## 2022-05-03 NOTE — Assessment & Plan Note (Signed)
Ongoing and stable.  Continue supplement and check Vit D level today.

## 2022-05-03 NOTE — Assessment & Plan Note (Signed)
Chronic, ongoing.  Continue current medication regimen and adjust as needed, monitor LFT as has history of elevation.  Recommend she take both Zetia and Pravastatin.  May consider adjusting statin regimen if ongoing elevations due to family history, could consider Praluent or Repatha.

## 2022-05-03 NOTE — Assessment & Plan Note (Signed)
Check CMP and GGT today.

## 2022-05-03 NOTE — Assessment & Plan Note (Signed)
Chronic, stable with no inhalers.  Will plan on spirometry next visit.  If worsening symptoms initiate inhaler regimen.  Continue yearly lung screening -- recommend she call to schedule this and pamphlet provided.  Referral placed.

## 2022-05-03 NOTE — Assessment & Plan Note (Signed)
Refer to depression plan of care. 

## 2022-05-03 NOTE — Assessment & Plan Note (Signed)
Chronic, ongoing.  Denies SI/HI.  Reports benefit from current regimen, will continue this and send refills as needed.  Continue therapy visits as needed.  Return in 6 months.

## 2022-05-03 NOTE — Assessment & Plan Note (Signed)
Chronic, stable.  With BP at goal on recheck, avoid Prednisone as increases BP.  Recommend she monitor BP at least a few mornings a week at home and document.  DASH diet at home.  Continue current medication regimen and adjust as needed, may be able to reduce Amlodipine back down in future if BP remains stable.  Labs: CBC, CMP.  Refills up to date.  Return in 6 months.

## 2022-05-03 NOTE — Progress Notes (Signed)
BP 128/80 (BP Location: Right Arm, Patient Position: Sitting, Cuff Size: Large)   Pulse 90   Temp 97.6 F (36.4 C) (Oral)   Ht _0  (1.575 m)   Wt 178 lb 6.4 oz (80.9 kg)   SpO2 95%   BMI 32.63 kg/m    Subjective:    Patient ID: Allison Pham, female    DOB: 05/12/1961, 61 y.o.   MRN: [id number]  HPI: Allison Pham is a 61 y.o. female  Chief Complaint  Patient presents with   Hyperlipidemia   Hypertension   COPD   Mood   Poison Ivy    Patient says she is having issues with poison ivy again. Patient says she is feeling bad and wonder if the poison ivy is the cause of it.    HYPERTENSION/HLD Currently taking Amlodipine 10 MG and Losartan 100 MG daily. Taking Zetia 10 MG and Pravastatin 40 MG daily for HLD.  Satisfied with current treatment? yes Duration of hypertension: chronic BP monitoring frequency: daily BP range: 120-130/70-80 range prior to oral steroids, took steroids and this elevated BP BP medication side effects: no Past BP meds: multiple kinds Duration of hyperlipidemia: chronic Cholesterol medication side effects: no Cholesterol supplements: none Past cholesterol medications: multiple different kinds Medication compliance: good compliance Aspirin: no Recent stressors: no Recurrent headaches: no Visual changes: no Palpitations: no Dyspnea: no Chest pain: no Lower extremity edema: no Dizzy/lightheaded: no  The 10-year ASCVD risk score (Arnett DK, et al., 2019) is: 6.9%   Values used to calculate the score:     Age: 89 years     Sex: Female     Is Non-Hispanic African American: No     Diabetic: No     Tobacco smoker: No     Systolic Blood Pressure: 924 mmHg     Is BP treated: Yes     HDL Cholesterol: 52 mg/dL     Total Cholesterol: 273 mg/dL   COPD Centrilobular mild noted on lung CT 2019.  She is a past smoker -- off and on, was a 1 PPD smoker in past.  Quit >6 years ago.  No current symptoms or inhalers.  There are smokers in her  household. COPD status: stable Satisfied with current treatment?: yes Oxygen use: no Dyspnea frequency:  none Cough frequency: none Limitation of activity: no Productive cough: none Last Spirometry: unknown Pneumovax:  refuses Influenza:  refuses   RASH Exposure to poison oak or ivy from her yard.  Has multiple patches and has been treated for this in past months.  Would prefer steroid shot, as oral steroids caused elevation in blood pressure. Duration:  days  Location: generalized  Itching: no Burning: no Redness: yes Oozing: no Scaling: yes Blisters: no Painful: no Fevers: no Change in detergents/soaps/personal care products: no Recent illness: no Recent travel:no History of same: yes Context: stable Alleviating factors: hydrocortisone cream and benadryl Treatments attempted:lotion/moisturizer, Prednisone Shortness of breath: no  Throat/tongue swelling: no Myalgias/arthralgias: no   DEPRESSION Continues on Trazodone 25 MG at night, takes only as needed. Does have Buspar at home -- takes this daily with benefit. Currently poor sleep due to oral steroids with rash.   Mood status: controlled Satisfied with current treatment?: yes Symptom severity: mild  Duration of current treatment : chronic Side effects: no Medication compliance: good compliance Psychotherapy/counseling: none Previous psychiatric medications:  Depressed mood: no Anxious mood: no Anhedonia: no Significant weight loss or gain: no Insomnia: yes hard to fall asleep Fatigue:  no Feelings of worthlessness or guilt: no Impaired concentration/indecisiveness: no Suicidal ideations: no Hopelessness: no Crying spells: no    05/03/2022   10:20 AM 01/31/2022    9:25 AM 11/10/2021   10:41 AM 10/27/2021   10:54 AM 10/20/2021   11:19 AM  Depression screen PHQ 2/9  Decreased Interest _0 Down, Depressed, Hopeless _1 PHQ - 2 Score _2 Altered sleeping _3 0 1  Tired, decreased energy  _4 Change in appetite 1 1 0 1 0  Feeling bad or failure about yourself  1 1 0 1 1  Trouble concentrating 2 1 0 0 0  Moving slowly or fidgety/restless 0 0 0 0 0  Suicidal thoughts 0 0 0 0 0  PHQ-9 Score _5 Difficult doing work/chores Not difficult at all Not difficult at all  Somewhat difficult        05/03/2022   10:20 AM 01/31/2022    9:26 AM 11/10/2021   10:42 AM 10/27/2021   10:55 AM  GAD 7 : Generalized Anxiety Score  Nervous, Anxious, on Edge _6 Control/stop worrying 0 _7 Worry too much - different things _8 Trouble relaxing _9 Restless 0 0 2 0  Easily annoyed or irritable 0 0 2 1  Afraid - awful might happen 0 0 1 0  Total GAD 7 Score _10 Anxiety Difficulty Not difficult at all Somewhat difficult Not difficult at all Somewhat difficult   Relevant past medical, surgical, family and social history reviewed and updated as indicated. Interim medical history since our last visit reviewed. Allergies and medications reviewed and updated.  Review of Systems  Constitutional:  Negative for activity change, appetite change, diaphoresis, fatigue and fever.  Respiratory:  Negative for cough, chest tightness and shortness of breath.   Cardiovascular:  Negative for chest pain, palpitations and leg swelling.  Gastrointestinal: Negative.   Skin:  Positive for rash.  Neurological: Negative.   Psychiatric/Behavioral: Negative.     Per HPI unless specifically indicated above     Objective:    BP 128/80 (BP Location: Right Arm, Patient Position: Sitting, Cuff Size: Large)   Pulse 90   Temp 97.6 F (36.4 C) (Oral)   Ht _11  (1.575 m)   Wt 178 lb 6.4 oz (80.9 kg)   SpO2 95%   BMI 32.63 kg/m   Wt Readings from Last 3 Encounters:  05/03/22 178 lb 6.4 oz (80.9 kg)  01/31/22 178 lb (80.7 kg)  11/10/21 174 lb (78.9 kg)    Physical Exam Vitals and nursing note reviewed.  Constitutional:      General: She is awake. She is not in acute  distress.    Appearance: She is well-developed and overweight. She is not ill-appearing.  HENT:     Head: Normocephalic.     Right Ear: Hearing normal.     Left Ear: Hearing normal.  Eyes:     General: Lids are normal.        Right eye: No discharge.        Left eye: No discharge.     Conjunctiva/sclera: Conjunctivae normal.     Pupils: Pupils are equal, round, and reactive to light.  Neck:     Vascular: No carotid bruit.  Cardiovascular:  Rate and Rhythm: Normal rate and regular rhythm.     Heart sounds: Normal heart sounds. No murmur heard.    No gallop.  Pulmonary:     Effort: Pulmonary effort is normal. No accessory muscle usage or respiratory distress.     Breath sounds: Normal breath sounds.  Abdominal:     General: Bowel sounds are normal.     Palpations: Abdomen is soft.  Musculoskeletal:     Cervical back: Normal range of motion and neck supple.     Right lower leg: No edema.     Left lower leg: No edema.  Skin:    General: Skin is warm and dry.     Findings: Rash present.       Neurological:     Mental Status: She is alert and oriented to person, place, and time.  Psychiatric:        Attention and Perception: Attention normal.        Mood and Affect: Mood normal.        Speech: Speech normal.        Behavior: Behavior normal. Behavior is cooperative.        Thought Content: Thought content normal.    Results for orders placed or performed in visit on 01/31/22  CBC with Differential/Platelet  Result Value Ref Range   WBC 10.3 3.4 - 10.8 x10E3/uL   RBC 5.68 (H) 3.77 - 5.28 x10E6/uL   Hemoglobin 16.5 (H) 11.1 - 15.9 g/dL   Hematocrit 49.7 (H) 34.0 - 46.6 %   MCV 88 79 - 97 fL   MCH 29.0 26.6 - 33.0 pg   MCHC 33.2 31.5 - 35.7 g/dL   RDW 13.4 11.7 - 15.4 %   Platelets 283 150 - 450 x10E3/uL   Neutrophils 48 Not Estab. %   Lymphs 40 Not Estab. %   Monocytes 9 Not Estab. %   Eos 2 Not Estab. %   Basos 1 Not Estab. %   Neutrophils Absolute 4.9 1.4 -  7.0 x10E3/uL   Lymphocytes Absolute 4.2 (H) 0.7 - 3.1 x10E3/uL   Monocytes Absolute 0.9 0.1 - 0.9 x10E3/uL   EOS (ABSOLUTE) 0.2 0.0 - 0.4 x10E3/uL   Basophils Absolute 0.1 0.0 - 0.2 x10E3/uL   Immature Granulocytes 0 Not Estab. %   Immature Grans (Abs) 0.0 0.0 - 0.1 x10E3/uL  Comprehensive metabolic panel  Result Value Ref Range   Glucose 90 70 - 99 mg/dL   BUN 18 8 - 27 mg/dL   Creatinine, Ser 0.95 0.57 - 1.00 mg/dL   eGFR 68 >59 mL/min/1.73   BUN/Creatinine Ratio 19 12 - 28   Sodium 142 134 - 144 mmol/L   Potassium 4.3 3.5 - 5.2 mmol/L   Chloride 106 96 - 106 mmol/L   CO2 20 20 - 29 mmol/L   Calcium 9.7 8.7 - 10.3 mg/dL   Total Protein 7.0 6.0 - 8.5 g/dL   Albumin 4.3 3.9 - 4.9 g/dL   Globulin, Total 2.7 1.5 - 4.5 g/dL   Albumin/Globulin Ratio 1.6 1.2 - 2.2   Bilirubin Total 0.4 0.0 - 1.2 mg/dL   Alkaline Phosphatase 100 44 - 121 IU/L   AST 16 0 - 40 IU/L   ALT 33 (H) 0 - 32 IU/L  Lipid Panel w/o Chol/HDL Ratio  Result Value Ref Range   Cholesterol, Total 273 (H) 100 - 199 mg/dL   Triglycerides 168 (H) 0 - 149 mg/dL   HDL 52 >39 mg/dL   VLDL Cholesterol  Cal 31 5 - 40 mg/dL   LDL Chol Calc (NIH) 190 (H) 0 - 99 mg/dL   Lipid Comment: Comment   TSH  Result Value Ref Range   TSH 2.260 0.450 - 4.500 uIU/mL  VITAMIN D 25 Hydroxy (Vit-D Deficiency, Fractures)  Result Value Ref Range   Vit D, 25-Hydroxy 45.7 30.0 - 100.0 ng/mL  Gamma GT  Result Value Ref Range   GGT 197 (H) 0 - 60 IU/L      Assessment & Plan:   Problem List Items Addressed This Visit       Cardiovascular and Mediastinum   Essential hypertension, benign (Chronic)    Chronic, stable.  With BP at goal on recheck, avoid Prednisone as increases BP.  Recommend she monitor BP at least a few mornings a week at home and document.  DASH diet at home.  Continue current medication regimen and adjust as needed, may be able to reduce Amlodipine back down in future if BP remains stable.  Labs: CBC, CMP.  Refills up to  date.  Return in 6 months.      Relevant Orders   CBC with Differential/Platelet   Comprehensive metabolic panel     Respiratory   Centrilobular emphysema (HCC) - Primary (Chronic)    Chronic, stable with no inhalers.  Will plan on spirometry next visit.  If worsening symptoms initiate inhaler regimen.  Continue yearly lung screening -- recommend she call to schedule this and pamphlet provided.  Referral placed.      Relevant Orders   CBC with Differential/Platelet   Ambulatory Referral Lung Cancer Screening [facility]     Other   Depression, major, single episode, mild (HCC) (Chronic)    Chronic, ongoing.  Denies SI/HI.  Reports benefit from current regimen, will continue this and send refills as needed.  Continue therapy visits as needed.  Return in 6 months.      Dyslipidemia (Chronic)    Chronic, ongoing.  Continue current medication regimen and adjust as needed, monitor LFT as has history of elevation.  Recommend she take both Zetia and Pravastatin.  May consider adjusting statin regimen if ongoing elevations due to family history, could consider Praluent or Repatha.      Relevant Orders   Comprehensive metabolic panel   Lipid Panel w/o Chol/HDL Ratio   Elevated serum GGT level (Chronic)    Check CMP and GGT today.      Relevant Orders   Comprehensive metabolic panel   Gamma GT   Generalized anxiety disorder (Chronic)    Refer to depression plan of care.      Obesity (BMI 30.0-34.9) (Chronic)    BMI 32.63.Marland Kitchen Recommended eating smaller high protein, low fat meals more frequently and exercising 30 mins a day 5 times a week with a goal of 10-15lb weight loss in the next 3 months. Patient voiced their understanding and motivation to adhere to these recommendations.  Will see if we can get [facility] for patient in future.        Vitamin D deficiency (Chronic)    Ongoing and stable.  Continue supplement and check Vit D level today.      Relevant Orders   VITAMIN D  25 Hydroxy (Vit-D Deficiency, Fractures)     Follow up plan: Return in about 6 months (around 11/01/2022) for HTN/HLD, COPD, GAD, VIT D -- with spirometry.

## 2022-05-04 ENCOUNTER — Other Ambulatory Visit: Payer: Self-pay | Admitting: Nurse Practitioner

## 2022-05-04 DIAGNOSIS — R7989 Other specified abnormal findings of blood chemistry: Secondary | ICD-10-CM

## 2022-05-04 LAB — COMPREHENSIVE METABOLIC PANEL
ALT: 33 IU/L — ABNORMAL HIGH (ref 0–32)
AST: 21 IU/L (ref 0–40)
Albumin/Globulin Ratio: 1.8 (ref 1.2–2.2)
Albumin: 4.5 g/dL (ref 3.9–4.9)
Alkaline Phosphatase: 95 IU/L (ref 44–121)
BUN/Creatinine Ratio: 20 (ref 12–28)
BUN: 21 mg/dL (ref 8–27)
Bilirubin Total: 0.8 mg/dL (ref 0.0–1.2)
CO2: 20 mmol/L (ref 20–29)
Calcium: 9.8 mg/dL (ref 8.7–10.3)
Chloride: 104 mmol/L (ref 96–106)
Creatinine, Ser: 1.04 mg/dL — ABNORMAL HIGH (ref 0.57–1.00)
Globulin, Total: 2.5 g/dL (ref 1.5–4.5)
Glucose: 94 mg/dL (ref 70–99)
Potassium: 4.1 mmol/L (ref 3.5–5.2)
Sodium: 141 mmol/L (ref 134–144)
Total Protein: 7 g/dL (ref 6.0–8.5)
eGFR: 61 mL/min/{1.73_m2} (ref >59)

## 2022-05-04 LAB — CBC WITH DIFFERENTIAL/PLATELET
Basophils Absolute: 0.1 10*3/uL (ref 0.0–0.2)
Basos: 1 %
EOS (ABSOLUTE): 0.1 10*3/uL (ref 0.0–0.4)
Eos: 1 %
Hematocrit: 51.3 % — ABNORMAL HIGH (ref 34.0–46.6)
Hemoglobin: 17 g/dL — ABNORMAL HIGH (ref 11.1–15.9)
Immature Grans (Abs): 0 10*3/uL (ref 0.0–0.1)
Immature Granulocytes: 0 %
Lymphocytes Absolute: 4.7 10*3/uL — ABNORMAL HIGH (ref 0.7–3.1)
Lymphs: 33 %
MCH: 28.7 pg (ref 26.6–33.0)
MCHC: 33.1 g/dL (ref 31.5–35.7)
MCV: 87 fL (ref 79–97)
Monocytes Absolute: 1.1 10*3/uL — ABNORMAL HIGH (ref 0.1–0.9)
Monocytes: 8 %
Neutrophils Absolute: 8.2 10*3/uL — ABNORMAL HIGH (ref 1.4–7.0)
Neutrophils: 57 %
Platelets: 264 10*3/uL (ref 150–450)
RBC: 5.92 x10E6/uL — ABNORMAL HIGH (ref 3.77–5.28)
RDW: 13.2 % (ref 11.7–15.4)
WBC: 14.4 10*3/uL — ABNORMAL HIGH (ref 3.4–10.8)

## 2022-05-04 LAB — LIPID PANEL W/O CHOL/HDL RATIO
Cholesterol, Total: 265 mg/dL — ABNORMAL HIGH (ref 100–199)
HDL: 50 mg/dL (ref >39)
LDL Chol Calc (NIH): 174 mg/dL — ABNORMAL HIGH (ref 0–99)
Triglycerides: 220 mg/dL — ABNORMAL HIGH (ref 0–149)
VLDL Cholesterol Cal: 41 mg/dL — ABNORMAL HIGH (ref 5–40)

## 2022-05-04 LAB — VITAMIN D 25 HYDROXY (VIT D DEFICIENCY, FRACTURES): Vit D, 25-Hydroxy: 46.5 ng/mL (ref 30.0–100.0)

## 2022-05-04 LAB — GAMMA GT: GGT: 212 IU/L — ABNORMAL HIGH (ref 0–60)

## 2022-05-04 MED ORDER — DOXYCYCLINE HYCLATE 100 MG PO TABS: 100.0000 mg | ORAL_TABLET | Freq: Two times a day (BID) | ORAL | 0 refills | Status: AC

## 2022-05-04 NOTE — Addendum Note (Signed)
Addended by: Marnee Guarneri T on: 05/04/2022 01:40 PM   Modules accepted: Orders

## 2022-05-04 NOTE — Progress Notes (Signed)
Contacted via [place] -- needs repeat labs in 4 weeks outpatient please:)  Good afternoon Vaughan Basta, your labs have returned: - CBC shows mild elevation in white blood cell count and neutrophils, meaning possible infection or related to new rash area.  I would like to send in an antibiotic for you to take in case a bacterial infection presenting with the new rash.  Will send this in.  I would like to recheck CBC in 4 weeks on outpatient labs.   - Kidney function, creatinine and eGFR, remains stable, as is liver function, AST and ALT. Only mild elevation in ALT.  GGT remains elevated at 212, I would like to repeat ultrasound to liver area and recheck gall bladder.  I will order and they will call to schedule. - Cholesterol labs remain a elevated, for now continue Zetia and Pravastatin -- next visit we may consider changing to something different. - Vitamin D normal.  Any questions? Keep being amazing!!  Thank you for allowing me to participate in your care.  I appreciate you. Kindest regards, Briellah Baik

## 2022-05-06 ENCOUNTER — Encounter: Payer: Self-pay | Admitting: Nurse Practitioner

## 2022-05-09 ENCOUNTER — Other Ambulatory Visit: Payer: Self-pay | Admitting: Nurse Practitioner

## 2022-05-09 MED ORDER — PRAVASTATIN SODIUM 40 MG PO TABS: 40.0000 mg | ORAL_TABLET | Freq: Every day | ORAL | 3 refills | Status: DC

## 2022-05-09 NOTE — Telephone Encounter (Signed)
Medication Refill - Medication: pravastatin (PRAVACHOL) 40 MG tablet   Has the patient contacted their pharmacy? Yes.   Pt request that Rx be sent to different pharmacy  Preferred Pharmacy (with phone number or street name):  [place], [place] Phone: [phone]  Fax: [phone]     Has the patient been seen for an appointment in the last year OR does the patient have an upcoming appointment? Yes.    Agent: Please be advised that RX refills may take up to 3 business days. We ask that you follow-up with your pharmacy.

## 2022-05-09 NOTE — Telephone Encounter (Signed)
Requested Prescriptions  Pending Prescriptions Disp Refills   pravastatin (PRAVACHOL) 40 MG tablet 90 tablet 3    Sig: Take 1 tablet (40 mg total) by mouth daily.     Cardiovascular:  Antilipid - Statins Failed - 05/09/2022  4:29 PM      Failed - Lipid Panel in normal range within the last 12 months    Cholesterol, Total  Date Value Ref Range Status  05/03/2022 265 (H) 100 - 199 mg/dL Final   LDL Cholesterol (Calc)  Date Value Ref Range Status  07/02/2018 126 (H) mg/dL (calc) Final    Comment:    Reference range: <100 . Desirable range <100 mg/dL for primary prevention;   <70 mg/dL for patients with CHD or diabetic patients  with > or = 2 CHD risk factors. Marland Kitchen LDL-C is now calculated using the Martin-Hopkins  calculation, which is a validated novel method providing  better accuracy than the Friedewald equation in the  estimation of LDL-C.  Cresenciano Genre et al. Annamaria Helling. 0034;917(91): 2061-2068  (http://education.[facility].com/faq/FAQ164)    LDL Chol Calc (NIH)  Date Value Ref Range Status  05/03/2022 174 (H) 0 - 99 mg/dL Final   HDL  Date Value Ref Range Status  05/03/2022 50 >39 mg/dL Final   Triglycerides  Date Value Ref Range Status  05/03/2022 220 (H) 0 - 149 mg/dL Final         Passed - Patient is not pregnant      Passed - Valid encounter within last 12 months    Recent Outpatient Visits           6 days ago Centrilobular emphysema ([place])   [place], [name] T, NP   3 months ago Centrilobular emphysema ([place])   [place], [name] T, NP   6 months ago Essential hypertension, benign   [place], [place] T, NP   6 months ago Irritant contact dermatitis due to other agents   [place], [name] T, NP   6 months ago Allergic contact dermatitis due to plants, except food   [employer], [place], DO       Future Appointments             In 5 months  [place], Barbaraann Faster, NP [employer], [facility]

## 2022-06-27 ENCOUNTER — Ambulatory Visit: Payer: Self-pay | Admitting: *Deleted

## 2022-06-27 NOTE — Telephone Encounter (Signed)
  Chief Complaint: Rash Symptoms: Patchy reoccurring rash, both arms, face. "Never left my arms, now on face." States areas burn. Using Triamcinolone cream, seen several times for issues since April. Frequency: April Pertinent Negatives: Patient denies fever, joint pain, blistering Disposition: '[]'$ ED /'[]'$ Urgent Care (no appt availability in office) / '[x]'$ Appointment(In office/virtual)/ '[]'$  [facility]/ '[]'$ Home Care/ '[]'$ Refused Recommended Disposition /'[]'$  Mobile Bus/ '[]'$  Follow-up with PCP Additional Notes: Appt secured for Thursday. Care advise provided, pt verbalizes understanding.  Reason for Disposition  Localized rash present > 7 days  Answer Assessment - Initial Assessment Questions 1. APPEARANCE of RASH: "Describe the rash."      Red, fades at times, patchy 2. LOCATION: "Where is the rash located?"      Both arms and face 3. NUMBER: "How many spots are there?"      Patchy 4. SIZE: "How big are the spots?" (Inches, centimeters or compare to size of a coin)      Patches 5. ONSET: "When did the rash start?"      April 6. ITCHING: "Does the rash itch?" If Yes, ask: "How bad is the itch?"  (Scale 0-10; or none, mild, moderate, severe)     [place]urns 7. PAIN: "Does the rash hurt?" If Yes, ask: "How bad is the pain?"  (Scale 0-10; or none, mild, moderate, severe)    - NONE (0): no pain    - MILD (1-3): doesn't interfere with normal activities     - MODERATE (4-7): interferes with normal activities or awakens from sleep     - SEVERE (8-10): excruciating pain, unable to do any normal activities     Burns 8. OTHER SYMPTOMS: "Do you have any other symptoms?" (e.g., fever)     no  Protocols used: Rash or Redness - Localized-A-AH

## 2022-06-29 ENCOUNTER — Ambulatory Visit (INDEPENDENT_AMBULATORY_CARE_PROVIDER_SITE_OTHER): Payer: No Typology Code available for payment source | Admitting: Physician Assistant

## 2022-06-29 ENCOUNTER — Encounter: Payer: Self-pay | Admitting: Physician Assistant

## 2022-06-29 VITALS — BP 131/80 | HR 83 | Temp 97.8°F | Wt 175.4 lb

## 2022-06-29 DIAGNOSIS — R21 Rash and other nonspecific skin eruption: Secondary | ICD-10-CM | POA: Diagnosis not present

## 2022-06-29 NOTE — Progress Notes (Signed)
Acute Office Visit   Patient: Allison Pham   DOB: 1961/04/16   62 y.o. Female  MRN: [id number] Visit Date: 06/29/2022  Today's healthcare provider: Dani Gobble Oma Marzan, PA-C  Introduced myself to the patient as a Journalist, newspaper and provided education on APPs in clinical practice.    Chief Complaint  Patient presents with   Rash    Pt states she has been having an ongoing rash that comes occasionally. States sometimes it burns and itches. States she currently has a breakout on her face and upper L arm    Subjective    Rash   HPI     Rash    Additional comments: Pt states she has been having an ongoing rash that comes occasionally. States sometimes it burns and itches. States she currently has a breakout on her face and upper L arm       Last edited by Georgina Peer, CMA on 06/29/2022 10:38 AM.      Rash  Onset: gradual- states it is fading and thinks it is almost gone but is not sure  Duration: About a year ago  Associated Symptoms: burning and itching  Locations: Isolated patches along upper right arm and left forearm that are receding Rash on the side of her mouth she reports seemed to have develop a fever blister that popped then started to get better  Pain: burns a bit  Itching: yes sometimes and intermittently burns  Previous skin hx: these rashes have been ongoing for some time  Interventions: she has tried Kenalog cream and has been on steroids several times for this  She reports that the steroids "make my blood pressure go over 200s" when she has been on them in the past  New medications:no New soaps, detergents, lotions:no Dietary changes: no  Exposure to allergens: no  Patient became a bit more agitated as apt progressed stating she had been waiting for some time and needs to go as her daughter is broken down on the side of the highway     Medications: Outpatient Medications Prior to Visit  Medication Sig   amLODipine (NORVASC) 5 MG tablet Take 2 tablets  (10 mg total) by mouth daily.   aspirin 81 MG chewable tablet Chew 81 mg by mouth as needed.    busPIRone (BUSPAR) 5 MG tablet Take 0.5-1 tablets (2.5-5 mg total) by mouth 3 (three) times daily as needed.   ezetimibe (ZETIA) 10 MG tablet Take 1 tablet (10 mg total) by mouth daily.   loratadine (CLARITIN) 10 MG tablet Take 1 tablet (10 mg total) by mouth daily as needed for allergies.   losartan (COZAAR) 100 MG tablet Take 1 tablet (100 mg total) by mouth daily.   pravastatin (PRAVACHOL) 40 MG tablet Take 1 tablet (40 mg total) by mouth daily.   traZODone (DESYREL) 50 MG tablet Take 0.5 tablets (25 mg total) by mouth at bedtime.   triamcinolone cream (KENALOG) 0.1 % Apply 1 Application topically 2 (two) times daily.   Vitamin D, Ergocalciferol, (DRISDOL) 1.25 MG (50000 UNIT) CAPS capsule Take 1 capsule (50,000 Units total) by mouth once a week.   No facility-administered medications prior to visit.    Review of Systems  Skin:  Positive for rash.       Objective    BP 131/80   Pulse 83   Temp 97.8 F (36.6 C) (Oral)   Wt 175 lb 6.4 oz (79.6 kg)   SpO2  97%   BMI 32.08 kg/m    Physical Exam Vitals reviewed.  Constitutional:      General: She is awake.     Appearance: Normal appearance. She is well-developed and well-groomed.  HENT:     Head: Normocephalic and atraumatic.  Eyes:     General: Lids are normal. Gaze aligned appropriately.     Extraocular Movements: Extraocular movements intact.     Conjunctiva/sclera: Conjunctivae normal.  Pulmonary:     Effort: Pulmonary effort is normal.  Skin:    Findings: Rash present.          Comments: Erythematous and scaly maculopapular rash along left side of mouth and lips with some cracking    Neurological:     Mental Status: She is alert.  Psychiatric:        Attention and Perception: Attention and perception normal.        Mood and Affect: Affect is angry.        Speech: Speech normal.        Behavior: Behavior is  agitated. Behavior is cooperative.       No results found for any visits on 06/29/22.  Assessment & Plan      No follow-ups on file.      Problem List Items Addressed This Visit   None Visit Diagnoses     Localized maculopapular rash    -  Primary Chronic per patient and recurring She reports this is a recurring problem for the past year or so- I am unsure if she has achieved complete resolution at any point as she was not forthcoming during HPI Rash resembles eczema but I cannot rule out dermatitis either  Offered derm ref- she declined Patient states she knows what this is- states its poison ivy but did not mention in HPI interview that she had been exposed to it previously  She became agitated and states "I'll just make another apt with [name]. I know what this is and she can help me" before leaving We were not able to discuss potential treatment, intervention or management plan as patient was agitated and needed to leave before apt could be concluded.          No follow-ups on file.   I, Jovie Swanner E Folasade Mooty, PA-C, have reviewed all documentation for this visit. The documentation on 07/03/22 for the exam, diagnosis, procedures, and orders are all accurate and complete.   Talitha Givens, MHS, PA-C [facility]

## 2022-07-03 NOTE — Patient Instructions (Incomplete)
Rash, Adult  A rash is a change in the color of your skin. A rash can also change the way your skin feels. There are many different conditions and factors that can cause a rash. Follow these instructions at home: The goal of treatment is to stop the itching and keep the rash from spreading. Watch for any changes in your symptoms. Let your doctor know about them. Follow these instructions to help with your condition: Medicine Take or apply over-the-counter and prescription medicines only as told by your doctor. These may include medicines: To treat red or swollen skin (corticosteroid creams). To treat itching. To treat an allergy (oral antihistamines). To treat very bad symptoms (oral corticosteroids).  Skin care Put cool cloths (compresses) on the affected areas. Do not scratch or rub your skin. Avoid covering the rash. Make sure that the rash is exposed to air as much as possible. Managing itching and discomfort Avoid hot showers or baths. These can make itching worse. A cold shower may help. Try taking a bath with: Epsom salts. You can get these at your local pharmacy or grocery store. Follow the instructions on the package. Baking soda. Pour a small amount into the bath as told by your doctor. Colloidal oatmeal. You can get this at your local pharmacy or grocery store. Follow the instructions on the package. Try putting baking soda paste onto your skin. Stir water into baking soda until it gets like a paste. Try putting on a lotion that relieves itchiness (calamine lotion). Keep cool and out of the sun. Sweating and being hot can make itching worse. General instructions  Rest as needed. Drink enough fluid to keep your pee (urine) pale yellow. Wear loose-fitting clothing. Avoid scented soaps, detergents, and perfumes. Use gentle soaps, detergents, perfumes, and other cosmetic products. Avoid anything that causes your rash. Keep a journal to help track what causes your rash. Write  down: What you eat. What cosmetic products you use. What you drink. What you wear. This includes jewelry. Keep all follow-up visits as told by your doctor. This is important. Contact a doctor if: You sweat at night. You lose weight. You pee (urinate) more than normal. You pee less than normal, or you notice that your pee is a darker color than normal. You feel weak. You throw up (vomit). Your skin or the whites of your eyes look yellow (jaundice). Your skin: Tingles. Is numb. Your rash: Does not go away after a few days. Gets worse. You are: More thirsty than normal. More tired than normal. You have: New symptoms. Pain in your belly (abdomen). A fever. Watery poop (diarrhea). Get help right away if: You have a fever and your symptoms suddenly get worse. You start to feel mixed up (confused). You have a very bad headache or a stiff neck. You have very bad joint pains or stiffness. You have jerky movements that you cannot control (seizure). Your rash covers all or most of your body. The rash may or may not be painful. You have blisters that: Are on top of the rash. Grow larger. Grow together. Are painful. Are inside your nose or mouth. You have a rash that: Looks like purple pinprick-sized spots all over your body. Has a "bull's eye" or looks like a target. Is red and painful, causes your skin to peel, and is not from being in the sun too long. Summary A rash is a change in the color of your skin. A rash can also change the way your skin   feels. The goal of treatment is to stop the itching and keep the rash from spreading. Take or apply over-the-counter and prescription medicines only as told by your doctor. Contact a doctor if you have new symptoms or symptoms that get worse. Keep all follow-up visits as told by your doctor. This is important. This information is not intended to replace advice given to you by your health care provider. Make sure you discuss any  questions you have with your health care provider. Document Revised: 12/13/2021 Document Reviewed: 03/24/2021 Elsevier Patient Education  2023 Elsevier Inc.  

## 2022-07-05 ENCOUNTER — Ambulatory Visit: Payer: No Typology Code available for payment source | Admitting: Nurse Practitioner

## 2022-07-05 ENCOUNTER — Encounter: Payer: Self-pay | Admitting: Nurse Practitioner

## 2022-07-05 VITALS — BP 133/88 | HR 88 | Temp 97.5°F | Ht 62.01 in | Wt 175.2 lb

## 2022-07-05 DIAGNOSIS — L247 Irritant contact dermatitis due to plants, except food: Secondary | ICD-10-CM | POA: Diagnosis not present

## 2022-07-05 DIAGNOSIS — B002 Herpesviral gingivostomatitis and pharyngotonsillitis: Secondary | ICD-10-CM | POA: Diagnosis not present

## 2022-07-05 MED ORDER — LORATADINE 10 MG PO TABS: 10.0000 mg | ORAL_TABLET | Freq: Every day | ORAL | 11 refills | Status: DC | PRN

## 2022-07-05 MED ORDER — BUSPIRONE HCL 5 MG PO TABS: 2.5000 mg | ORAL_TABLET | Freq: Three times a day (TID) | ORAL | 4 refills | Status: DC | PRN

## 2022-07-05 MED ORDER — METHYLPREDNISOLONE SODIUM SUCC 40 MG IJ SOLR: 40.0000 mg | Freq: Once | INTRAMUSCULAR | Status: DC

## 2022-07-05 MED ORDER — PRAVASTATIN SODIUM 40 MG PO TABS: 40.0000 mg | ORAL_TABLET | Freq: Every day | ORAL | 4 refills | Status: DC

## 2022-07-05 MED ORDER — LOSARTAN POTASSIUM 100 MG PO TABS: 100.0000 mg | ORAL_TABLET | Freq: Every day | ORAL | 4 refills | Status: DC

## 2022-07-05 MED ORDER — METHYLPREDNISOLONE SODIUM SUCC 40 MG IJ SOLR
40.0000 mg | Freq: Once | INTRAMUSCULAR | Status: AC
  Administered 2022-07-05: 40 mg via INTRAMUSCULAR

## 2022-07-05 MED ORDER — TRAZODONE HCL 50 MG PO TABS: 25.0000 mg | ORAL_TABLET | Freq: Every day | ORAL | 4 refills | Status: DC

## 2022-07-05 MED ORDER — AMLODIPINE BESYLATE 5 MG PO TABS: 10.0000 mg | ORAL_TABLET | Freq: Every day | ORAL | 4 refills | Status: DC

## 2022-07-05 MED ORDER — EZETIMIBE 10 MG PO TABS: 10.0000 mg | ORAL_TABLET | Freq: Every day | ORAL | 4 refills | Status: DC

## 2022-07-05 MED ORDER — VITAMIN D (ERGOCALCIFEROL) 1.25 MG (50000 UNIT) PO CAPS: 50000.0000 [IU] | ORAL_CAPSULE | ORAL | 4 refills | Status: DC

## 2022-07-05 MED ORDER — VALACYCLOVIR HCL 1 G PO TABS: 1000.0000 mg | ORAL_TABLET | Freq: Two times a day (BID) | ORAL | 12 refills | Status: DC

## 2022-07-05 NOTE — Assessment & Plan Note (Signed)
History of similar reported and + testing.  With current acute outbreak.  Send in Valtrex to take for 7 days -- refills placed on this for any acute outbreaks.

## 2022-07-05 NOTE — Progress Notes (Signed)
BP 133/88   Pulse 88   Temp (!) 97.5 F (36.4 C) (Oral)   Ht 5' 2.01" (1.575 m)   Wt 175 lb 3.2 oz (79.5 kg)   SpO2 95%   BMI 32.04 kg/m    Subjective:    Patient ID: [name], female    DOB: [birth year], 62 y.o.   MRN: [id number]  HPI: SIONA COULSTON is a 62 y.o. female  Chief Complaint  Patient presents with   Rash    Has had for past year   RASH History of rash -- multiple episodes of poison oak in past, last episode 10/27/21.  Had recent episode a week ago to right arm and left upper arm, improving now.    Recently had a fever blister to mouth, she placed bandage on it which caused it to bust and then rash presented to face.  She used Triamcinolone to area which helped, but sore continues to be present. Duration:  weeks  Location: face and arms  Itching: no Burning: no Redness: yes - mild and improving Oozing: no Scaling: no Blisters:  to lip area, but this burst Painful: no Fevers: no Change in detergents/soaps/personal care products: no Recent illness: no Recent travel:no History of same: yes Context: better Alleviating factors:  Triamcinolone cream Treatments attempted: Triamcinolone cream Shortness of breath: no  Throat/tongue swelling: no Myalgias/arthralgias: no   Relevant past medical, surgical, family and social history reviewed and updated as indicated. Interim medical history since our last visit reviewed. Allergies and medications reviewed and updated.  Review of Systems  Constitutional:  Negative for activity change, appetite change, diaphoresis, fatigue and fever.  Respiratory:  Negative for cough, chest tightness and shortness of breath.   Cardiovascular:  Negative for chest pain, palpitations and leg swelling.  Gastrointestinal: Negative.   Skin:  Positive for rash.  Neurological: Negative.   Psychiatric/Behavioral: Negative.      Per HPI unless specifically indicated above     Objective:    BP 133/88   Pulse 88   Temp (!)  97.5 F (36.4 C) (Oral)   Ht 5' 2.01" (1.575 m)   Wt 175 lb 3.2 oz (79.5 kg)   SpO2 95%   BMI 32.04 kg/m   Wt Readings from Last 3 Encounters:  07/05/22 175 lb 3.2 oz (79.5 kg)  06/29/22 175 lb 6.4 oz (79.6 kg)  05/03/22 178 lb 6.4 oz (80.9 kg)    Physical Exam Vitals and nursing note reviewed.  Constitutional:      General: She is awake. She is not in acute distress.    Appearance: She is well-developed and well-groomed. She is obese. She is not ill-appearing or toxic-appearing.  HENT:     Head: Normocephalic.     Right Ear: Hearing and external ear normal.     Left Ear: Hearing and external ear normal.  Eyes:     General: Lids are normal.        Right eye: No discharge.        Left eye: No discharge.     Conjunctiva/sclera: Conjunctivae normal.     Pupils: Pupils are equal, round, and reactive to light.  Neck:     Thyroid: No thyromegaly.     Vascular: No carotid bruit.  Cardiovascular:     Rate and Rhythm: Normal rate and regular rhythm.     Heart sounds: Normal heart sounds. No murmur heard.    No gallop.  Pulmonary:     Effort: Pulmonary effort  is normal. No accessory muscle usage or respiratory distress.     Breath sounds: Normal breath sounds.  Abdominal:     General: Bowel sounds are normal.     Palpations: Abdomen is soft. There is no hepatomegaly or splenomegaly.  Musculoskeletal:     Cervical back: Normal range of motion and neck supple.     Right lower leg: No edema.     Left lower leg: No edema.  Skin:    General: Skin is warm and dry.          Comments: Right lower arm with healing rash, mild scaling, similar to left upper arm.  Neurological:     Mental Status: She is alert and oriented to person, place, and time.  Psychiatric:        Attention and Perception: Attention normal.        Mood and Affect: Mood normal.        Speech: Speech normal.        Behavior: Behavior normal. Behavior is cooperative.        Thought Content: Thought content  normal.     Results for orders placed or performed in visit on 05/03/22  CBC with Differential/Platelet  Result Value Ref Range   WBC 14.4 (H) 3.4 - 10.8 x10E3/uL   RBC 5.92 (H) 3.77 - 5.28 x10E6/uL   Hemoglobin 17.0 (H) 11.1 - 15.9 g/dL   Hematocrit 51.3 (H) 34.0 - 46.6 %   MCV 87 79 - 97 fL   MCH 28.7 26.6 - 33.0 pg   MCHC 33.1 31.5 - 35.7 g/dL   RDW 13.2 11.7 - 15.4 %   Platelets 264 150 - 450 x10E3/uL   Neutrophils 57 Not Estab. %   Lymphs 33 Not Estab. %   Monocytes 8 Not Estab. %   Eos 1 Not Estab. %   Basos 1 Not Estab. %   Neutrophils Absolute 8.2 (H) 1.4 - 7.0 x10E3/uL   Lymphocytes Absolute 4.7 (H) 0.7 - 3.1 x10E3/uL   Monocytes Absolute 1.1 (H) 0.1 - 0.9 x10E3/uL   EOS (ABSOLUTE) 0.1 0.0 - 0.4 x10E3/uL   Basophils Absolute 0.1 0.0 - 0.2 x10E3/uL   Immature Granulocytes 0 Not Estab. %   Immature Grans (Abs) 0.0 0.0 - 0.1 x10E3/uL  Comprehensive metabolic panel  Result Value Ref Range   Glucose 94 70 - 99 mg/dL   BUN 21 8 - 27 mg/dL   Creatinine, Ser 1.04 (H) 0.57 - 1.00 mg/dL   eGFR 61 >59 mL/min/1.73   BUN/Creatinine Ratio 20 12 - 28   Sodium 141 134 - 144 mmol/L   Potassium 4.1 3.5 - 5.2 mmol/L   Chloride 104 96 - 106 mmol/L   CO2 20 20 - 29 mmol/L   Calcium 9.8 8.7 - 10.3 mg/dL   Total Protein 7.0 6.0 - 8.5 g/dL   Albumin 4.5 3.9 - 4.9 g/dL   Globulin, Total 2.5 1.5 - 4.5 g/dL   Albumin/Globulin Ratio 1.8 1.2 - 2.2   Bilirubin Total 0.8 0.0 - 1.2 mg/dL   Alkaline Phosphatase 95 44 - 121 IU/L   AST 21 0 - 40 IU/L   ALT 33 (H) 0 - 32 IU/L  Lipid Panel w/o Chol/HDL Ratio  Result Value Ref Range   Cholesterol, Total 265 (H) 100 - 199 mg/dL   Triglycerides 220 (H) 0 - 149 mg/dL   HDL 50 >39 mg/dL   VLDL Cholesterol Cal 41 (H) 5 - 40 mg/dL   LDL Chol Calc (  NIH) 174 (H) 0 - 99 mg/dL  Gamma GT  Result Value Ref Range   GGT 212 (H) 0 - 60 IU/L  VITAMIN D 25 Hydroxy (Vit-D Deficiency, Fractures)  Result Value Ref Range   Vit D, 25-Hydroxy 46.5 30.0 -  100.0 ng/mL      Assessment & Plan:   Problem List Items Addressed This Visit       Digestive   Oral herpes    History of similar reported and + testing.  With current acute outbreak.  Send in Valtrex to take for 7 days -- refills placed on this for any acute outbreaks.      Relevant Medications   valACYclovir (VALTREX) 1000 MG tablet     Musculoskeletal and Integument   Irritant contact dermatitis due to plants, except food - Primary    Acute and healing, she has a large amount of poison oak in yard and is exposed while mowing lawn. Have recommend long sleeves and pants when out in yard + avoidance of areas best she can.  May benefit allergist referral in future.  At this time area improving, continue Triamcinolone cream.  Provided Solu Medrol in office today.        Follow up plan: Return for as scheduled in May.

## 2022-07-05 NOTE — Assessment & Plan Note (Signed)
Acute and healing, she has a large amount of poison oak in yard and is exposed while mowing lawn. Have recommend long sleeves and pants when out in yard + avoidance of areas best she can.  May benefit allergist referral in future.  At this time area improving, continue Triamcinolone cream.  Provided Solu Medrol in office today.

## 2022-07-05 NOTE — Patient Instructions (Signed)
Rash, Adult  A rash is a change in the color of your skin. A rash can also change the way your skin feels. There are many different conditions and factors that can cause a rash. Follow these instructions at home: The goal of treatment is to stop the itching and keep the rash from spreading. Watch for any changes in your symptoms. Let your doctor know about them. Follow these instructions to help with your condition: Medicine Take or apply over-the-counter and prescription medicines only as told by your doctor. These may include medicines: To treat red or swollen skin (corticosteroid creams). To treat itching. To treat an allergy (oral antihistamines). To treat very bad symptoms (oral corticosteroids).  Skin care Put cool cloths (compresses) on the affected areas. Do not scratch or rub your skin. Avoid covering the rash. Make sure that the rash is exposed to air as much as possible. Managing itching and discomfort Avoid hot showers or baths. These can make itching worse. A cold shower may help. Try taking a bath with: Epsom salts. You can get these at your local pharmacy or grocery store. Follow the instructions on the package. Baking soda. Pour a small amount into the bath as told by your doctor. Colloidal oatmeal. You can get this at your local pharmacy or grocery store. Follow the instructions on the package. Try putting baking soda paste onto your skin. Stir water into baking soda until it gets like a paste. Try putting on a lotion that relieves itchiness (calamine lotion). Keep cool and out of the sun. Sweating and being hot can make itching worse. General instructions  Rest as needed. Drink enough fluid to keep your pee (urine) pale yellow. Wear loose-fitting clothing. Avoid scented soaps, detergents, and perfumes. Use gentle soaps, detergents, perfumes, and other cosmetic products. Avoid anything that causes your rash. Keep a journal to help track what causes your rash. Write  down: What you eat. What cosmetic products you use. What you drink. What you wear. This includes jewelry. Keep all follow-up visits as told by your doctor. This is important. Contact a doctor if: You sweat at night. You lose weight. You pee (urinate) more than normal. You pee less than normal, or you notice that your pee is a darker color than normal. You feel weak. You throw up (vomit). Your skin or the whites of your eyes look yellow (jaundice). Your skin: Tingles. Is numb. Your rash: Does not go away after a few days. Gets worse. You are: More thirsty than normal. More tired than normal. You have: New symptoms. Pain in your belly (abdomen). A fever. Watery poop (diarrhea). Get help right away if: You have a fever and your symptoms suddenly get worse. You start to feel mixed up (confused). You have a very bad headache or a stiff neck. You have very bad joint pains or stiffness. You have jerky movements that you cannot control (seizure). Your rash covers all or most of your body. The rash may or may not be painful. You have blisters that: Are on top of the rash. Grow larger. Grow together. Are painful. Are inside your nose or mouth. You have a rash that: Looks like purple pinprick-sized spots all over your body. Has a "bull's eye" or looks like a target. Is red and painful, causes your skin to peel, and is not from being in the sun too long. Summary A rash is a change in the color of your skin. A rash can also change the way your skin   feels. The goal of treatment is to stop the itching and keep the rash from spreading. Take or apply over-the-counter and prescription medicines only as told by your doctor. Contact a doctor if you have new symptoms or symptoms that get worse. Keep all follow-up visits as told by your doctor. This is important. This information is not intended to replace advice given to you by your health care provider. Make sure you discuss any  questions you have with your health care provider. Document Revised: 12/13/2021 Document Reviewed: 03/24/2021 Elsevier Patient Education  2023 Elsevier Inc.  

## 2022-07-17 ENCOUNTER — Other Ambulatory Visit: Payer: Self-pay

## 2022-07-17 DIAGNOSIS — Z122 Encounter for screening for malignant neoplasm of respiratory organs: Secondary | ICD-10-CM

## 2022-07-17 DIAGNOSIS — Z87891 Personal history of nicotine dependence: Secondary | ICD-10-CM

## 2022-10-23 ENCOUNTER — Encounter: Payer: Self-pay | Admitting: Nurse Practitioner

## 2022-10-23 ENCOUNTER — Ambulatory Visit (INDEPENDENT_AMBULATORY_CARE_PROVIDER_SITE_OTHER): Payer: No Typology Code available for payment source | Admitting: Nurse Practitioner

## 2022-10-23 VITALS — BP 138/78 | HR 92 | Temp 97.8°F | Ht 62.01 in | Wt 172.6 lb

## 2022-10-23 DIAGNOSIS — R21 Rash and other nonspecific skin eruption: Secondary | ICD-10-CM | POA: Insufficient documentation

## 2022-10-23 DIAGNOSIS — L247 Irritant contact dermatitis due to plants, except food: Secondary | ICD-10-CM

## 2022-10-23 MED ORDER — GABAPENTIN 300 MG PO CAPS: 300.0000 mg | ORAL_CAPSULE | Freq: Every evening | ORAL | 3 refills | Status: DC | PRN

## 2022-10-23 MED ORDER — METHYLPREDNISOLONE 4 MG PO TBPK: ORAL_TABLET | ORAL | 0 refills | Status: DC

## 2022-10-23 MED ORDER — CLOBETASOL PROPIONATE 0.05 % EX CREA: 1.0000 | TOPICAL_CREAM | Freq: Two times a day (BID) | CUTANEOUS | 4 refills | Status: AC

## 2022-10-23 MED ORDER — METHYLPREDNISOLONE SODIUM SUCC 40 MG IJ SOLR
40.0000 mg | Freq: Once | INTRAMUSCULAR | Status: AC
  Administered 2022-10-23: 40 mg via INTRAMUSCULAR

## 2022-10-23 MED ORDER — VALACYCLOVIR HCL 1 G PO TABS: 1000.0000 mg | ORAL_TABLET | Freq: Two times a day (BID) | ORAL | 12 refills | Status: DC

## 2022-10-23 NOTE — Patient Instructions (Signed)
Rash, Adult  A rash is a change in the color of your skin. A rash can also change the way your skin feels. There are many different conditions and factors that can cause a rash. Follow these instructions at home: The goal of treatment is to stop the itching and keep the rash from spreading. Watch for any changes in your symptoms. Let your doctor know about them. Follow these instructions to help with your condition: Medicine Take or apply over-the-counter and prescription medicines only as told by your doctor. These may include medicines: To treat red or swollen skin (corticosteroid creams). To treat itching. To treat an allergy (oral antihistamines). To treat very bad symptoms (oral corticosteroids).  Skin care Put cool cloths (compresses) on the affected areas. Do not scratch or rub your skin. Avoid covering the rash. Make sure that the rash is exposed to air as much as possible. Managing itching and discomfort Avoid hot showers or baths. These can make itching worse. A cold shower may help. Try taking a bath with: Epsom salts. You can get these at your local pharmacy or grocery store. Follow the instructions on the package. Baking soda. Pour a small amount into the bath as told by your doctor. Colloidal oatmeal. You can get this at your local pharmacy or grocery store. Follow the instructions on the package. Try putting baking soda paste onto your skin. Stir water into baking soda until it gets like a paste. Try putting on a lotion that relieves itchiness (calamine lotion). Keep cool and out of the sun. Sweating and being hot can make itching worse. General instructions  Rest as needed. Drink enough fluid to keep your pee (urine) pale yellow. Wear loose-fitting clothing. Avoid scented soaps, detergents, and perfumes. Use gentle soaps, detergents, perfumes, and other cosmetic products. Avoid anything that causes your rash. Keep a journal to help track what causes your rash. Write  down: What you eat. What cosmetic products you use. What you drink. What you wear. This includes jewelry. Keep all follow-up visits as told by your doctor. This is important. Contact a doctor if: You sweat at night. You lose weight. You pee (urinate) more than normal. You pee less than normal, or you notice that your pee is a darker color than normal. You feel weak. You throw up (vomit). Your skin or the whites of your eyes look yellow (jaundice). Your skin: Tingles. Is numb. Your rash: Does not go away after a few days. Gets worse. You are: More thirsty than normal. More tired than normal. You have: New symptoms. Pain in your belly (abdomen). A fever. Watery poop (diarrhea). Get help right away if: You have a fever and your symptoms suddenly get worse. You start to feel mixed up (confused). You have a very bad headache or a stiff neck. You have very bad joint pains or stiffness. You have jerky movements that you cannot control (seizure). Your rash covers all or most of your body. The rash may or may not be painful. You have blisters that: Are on top of the rash. Grow larger. Grow together. Are painful. Are inside your nose or mouth. You have a rash that: Looks like purple pinprick-sized spots all over your body. Has a "bull's eye" or looks like a target. Is red and painful, causes your skin to peel, and is not from being in the sun too long. Summary A rash is a change in the color of your skin. A rash can also change the way your skin   feels. The goal of treatment is to stop the itching and keep the rash from spreading. Take or apply over-the-counter and prescription medicines only as told by your doctor. Contact a doctor if you have new symptoms or symptoms that get worse. Keep all follow-up visits as told by your doctor. This is important. This information is not intended to replace advice given to you by your health care provider. Make sure you discuss any  questions you have with your health care provider. Document Revised: 12/13/2021 Document Reviewed: 03/24/2021 Elsevier Patient Education  2023 Elsevier Inc.  

## 2022-10-23 NOTE — Assessment & Plan Note (Signed)
Acute for 3 weeks, she has a large amount of poison oak in yard and is exposed while mowing lawn. Have recommend long sleeves and pants when out in yard + avoidance of areas best she can.  ?rash related to alternate allergy -- ?grass.  Provided Solu Medrol in office today and will send home with dose pack to see if tolerates.  Clobetasol cream sent in.  Allergy testing on labs today.

## 2022-10-23 NOTE — Progress Notes (Signed)
BP 138/78   Pulse 92   Temp 97.8 F (36.6 C) (Oral)   Ht 5' 2.01" (1.575 m)   Wt 172 lb 9.6 oz (78.3 kg)   SpO2 (!) 1%   BMI 31.56 kg/m    Subjective:    Patient ID: Allison Pham, female    DOB: Mar 12, 1961, 62 y.o.   MRN: [id number]  HPI: Allison Pham is a 62 y.o. female  Chief Complaint  Patient presents with   Rash    Left forearm and left ankle for past 3 weeks   RASH Has rash to left forearm and ankle left side 3 weeks ago.  Was cutting grass on riding lawn mower, has history of same -- reports each time out on grass she gets this.  Prednisone in past increased blood pressure.  Has a little Triamcinolone left at home.   Needs refills on Gabapentin + Valtrex today. Duration:  weeks  Location: left ankle and left forearm  Itching: yes Burning: no Redness: yes Oozing: no Scaling: yes Blisters: no Painful: no Fevers: no Change in detergents/soaps/personal care products: no Recent illness: no Recent travel:no History of same: yes Context: fluctuating Alleviating factors: nothing Treatments attempted: Triamcinolone cream Shortness of breath: no  Throat/tongue swelling: no Myalgias/arthralgias: no   Relevant past medical, surgical, family and social history reviewed and updated as indicated. Interim medical history since our last visit reviewed. Allergies and medications reviewed and updated.  Review of Systems  Constitutional:  Negative for activity change, appetite change, diaphoresis, fatigue and fever.  Respiratory:  Negative for cough, chest tightness and shortness of breath.   Cardiovascular:  Negative for chest pain, palpitations and leg swelling.  Gastrointestinal: Negative.   Skin:  Positive for rash.  Neurological: Negative.   Psychiatric/Behavioral: Negative.      Per HPI unless specifically indicated above     Objective:    BP 138/78   Pulse 92   Temp 97.8 F (36.6 C) (Oral)   Ht 5' 2.01" (1.575 m)   Wt 172 lb 9.6 oz (78.3 kg)    SpO2 (!) 1%   BMI 31.56 kg/m   Wt Readings from Last 3 Encounters:  10/23/22 172 lb 9.6 oz (78.3 kg)  07/05/22 175 lb 3.2 oz (79.5 kg)  06/29/22 175 lb 6.4 oz (79.6 kg)    Physical Exam Vitals and nursing note reviewed.  Constitutional:      General: She is not in acute distress.    Appearance: Normal appearance. She is well-groomed. She is obese. She is not ill-appearing or toxic-appearing.  HENT:     Head: Normocephalic and atraumatic.     Right Ear: Hearing and external ear normal.     Left Ear: Hearing and external ear normal.     Mouth/Throat:     Pharynx: Oropharynx is clear.  Eyes:     General: No scleral icterus.       Right eye: No discharge.        Left eye: No discharge.     Extraocular Movements: Extraocular movements intact.     Conjunctiva/sclera: Conjunctivae normal.     Pupils: Pupils are equal, round, and reactive to light.  Neck:     Thyroid: No thyromegaly.     Vascular: No carotid bruit.  Cardiovascular:     Rate and Rhythm: Normal rate and regular rhythm.     Pulses: Normal pulses.     Heart sounds: Normal heart sounds. No murmur heard.    No gallop.  Pulmonary:     Effort: Pulmonary effort is normal. No accessory muscle usage or respiratory distress.     Breath sounds: Normal breath sounds. No wheezing, rhonchi or rales.  Chest:     Chest wall: No tenderness.  Abdominal:     General: Bowel sounds are normal. There is no distension.     Tenderness: There is no abdominal tenderness.  Musculoskeletal:        General: Normal range of motion.     Cervical back: Normal range of motion and neck supple.  Lymphadenopathy:     Cervical: No cervical adenopathy.  Skin:    General: Skin is warm and dry.     Capillary Refill: Capillary refill takes less than 2 seconds.     Coloration: Skin is not jaundiced or pale.     Findings: Rash present. No bruising, erythema or lesion. Rash is scaling.     Comments: To left forearm approx 7 cm patch of erythema with  no blisters, slightly raised, scaly interior.  Similar rash to left ankle.  Neurological:     General: No focal deficit present.     Mental Status: She is alert and oriented to person, place, and time. Mental status is at baseline.  Psychiatric:        Mood and Affect: Mood normal.        Speech: Speech normal.        Behavior: Behavior normal.        Thought Content: Thought content normal.     Results for orders placed or performed in visit on 05/03/22  CBC with Differential/Platelet  Result Value Ref Range   WBC 14.4 (H) 3.4 - 10.8 x10E3/uL   RBC 5.92 (H) 3.77 - 5.28 x10E6/uL   Hemoglobin 17.0 (H) 11.1 - 15.9 g/dL   Hematocrit 16.1 (H) 09.6 - 46.6 %   MCV 87 79 - 97 fL   MCH 28.7 26.6 - 33.0 pg   MCHC 33.1 31.5 - 35.7 g/dL   RDW 04.5 40.9 - 81.1 %   Platelets 264 150 - 450 x10E3/uL   Neutrophils 57 Not Estab. %   Lymphs 33 Not Estab. %   Monocytes 8 Not Estab. %   Eos 1 Not Estab. %   Basos 1 Not Estab. %   Neutrophils Absolute 8.2 (H) 1.4 - 7.0 x10E3/uL   Lymphocytes Absolute 4.7 (H) 0.7 - 3.1 x10E3/uL   Monocytes Absolute 1.1 (H) 0.1 - 0.9 x10E3/uL   EOS (ABSOLUTE) 0.1 0.0 - 0.4 x10E3/uL   Basophils Absolute 0.1 0.0 - 0.2 x10E3/uL   Immature Granulocytes 0 Not Estab. %   Immature Grans (Abs) 0.0 0.0 - 0.1 x10E3/uL  Comprehensive metabolic panel  Result Value Ref Range   Glucose 94 70 - 99 mg/dL   BUN 21 8 - 27 mg/dL   Creatinine, Ser 9.14 (H) 0.57 - 1.00 mg/dL   eGFR 61 >78 GN/FAO/1.30   BUN/Creatinine Ratio 20 12 - 28   Sodium 141 134 - 144 mmol/L   Potassium 4.1 3.5 - 5.2 mmol/L   Chloride 104 96 - 106 mmol/L   CO2 20 20 - 29 mmol/L   Calcium 9.8 8.7 - 10.3 mg/dL   Total Protein 7.0 6.0 - 8.5 g/dL   Albumin 4.5 3.9 - 4.9 g/dL   Globulin, Total 2.5 1.5 - 4.5 g/dL   Albumin/Globulin Ratio 1.8 1.2 - 2.2   Bilirubin Total 0.8 0.0 - 1.2 mg/dL   Alkaline Phosphatase 95 44 - 121 IU/L  AST 21 0 - 40 IU/L   ALT 33 (H) 0 - 32 IU/L  Lipid Panel w/o Chol/HDL Ratio   Result Value Ref Range   Cholesterol, Total 265 (H) 100 - 199 mg/dL   Triglycerides 213 (H) 0 - 149 mg/dL   HDL 50 >08 mg/dL   VLDL Cholesterol Cal 41 (H) 5 - 40 mg/dL   LDL Chol Calc (NIH) 657 (H) 0 - 99 mg/dL  Gamma GT  Result Value Ref Range   GGT 212 (H) 0 - 60 IU/L  VITAMIN D 25 Hydroxy (Vit-D Deficiency, Fractures)  Result Value Ref Range   Vit D, 25-Hydroxy 46.5 30.0 - 100.0 Pham/mL      Assessment & Plan:   Problem List Items Addressed This Visit       Musculoskeletal and Integument   Irritant contact dermatitis due to plants, except food    Acute for 3 weeks, she has a large amount of poison oak in yard and is exposed while mowing lawn. Have recommend long sleeves and pants when out in yard + avoidance of areas best she can.  ?rash related to alternate allergy -- ?grass.  Provided Solu Medrol in office today and will send home with dose pack to see if tolerates.  Clobetasol cream sent in.  Allergy testing on labs today.      RESOLVED: Rash - Primary   Relevant Medications   methylPREDNISolone sodium succinate (SOLU-MEDROL) 40 mg/mL injection 40 mg   Other Relevant Orders   Allergens(6) Grasses   Allergens (8) Trees   Allergen Profile, Basic Food ([facility])   Allergen Profile, Ragweed Plus     Follow up plan: Return for as scheduled May 8th.

## 2022-10-27 LAB — ALLERGENS(14)
Allergen Corn, IgE: <0.1 kU/L
Allergen Salmon IgE: <0.1 kU/L
Beef IgE: <0.1 kU/L
Chocolate/Cacao IgE: <0.1 kU/L
Codfish IgE: <0.1 kU/L
Egg, Whole IgE: <0.1 kU/L
F037-IgE Mussel: <0.1 kU/L
Milk IgE: <0.1 kU/L
Peanut IgE: <0.1 kU/L
Pork IgE: <0.1 kU/L
Shrimp IgE: <0.1 kU/L
Soybean IgE: <0.1 kU/L
Tuna: <0.1 kU/L
Wheat IgE: <0.1 kU/L

## 2022-10-27 LAB — ALLERGENS(6) GRASSES
Bahia Grass IgE: <0.1 kU/L
Bermuda Grass IgE: <0.1 kU/L
Johnson Grass IgE: <0.1 kU/L
Kentucky Bluegrass IgE: <0.1 kU/L
Rye Grass, Perennial IgE: <0.1 kU/L
Timothy Grass IgE: <0.1 kU/L

## 2022-10-27 LAB — ALLERGENS (8) TREES
Amer Sycamore IgE Qn: <0.1 kU/L
Cottonwood IgE: <0.1 kU/L
Pine, White IgE: <0.1 kU/L
T010-IgE Walnut: <0.1 kU/L
T012-IgE Willow: <0.1 kU/L
T015-IgE Ash, White: <0.1 kU/L
T044-IgE Hackberry: <0.1 kU/L
T210-IgE Privet, Common: <0.1 kU/L

## 2022-10-27 LAB — ALLERGEN PROFILE, RAGWEED PLUS
Allergen Banana IgE: 0.15 kU/L — AB
Allergen Melon IgE: <0.1 kU/L
Allergen Watermelon IgE: <0.1 kU/L
Ragweed, Short IgE: <0.1 kU/L

## 2022-10-27 NOTE — Progress Notes (Signed)
Contacted via MyChart   Overall allergy profile returned and only elevation is in bananas, so may have a mild allergy to these.

## 2022-11-01 ENCOUNTER — Ambulatory Visit: Payer: No Typology Code available for payment source | Admitting: Nurse Practitioner

## 2022-11-01 DIAGNOSIS — I10 Essential (primary) hypertension: Secondary | ICD-10-CM

## 2022-11-01 DIAGNOSIS — D582 Other hemoglobinopathies: Secondary | ICD-10-CM

## 2022-11-01 DIAGNOSIS — L247 Irritant contact dermatitis due to plants, except food: Secondary | ICD-10-CM

## 2022-11-01 DIAGNOSIS — Z87891 Personal history of nicotine dependence: Secondary | ICD-10-CM

## 2022-11-01 DIAGNOSIS — E669 Obesity, unspecified: Secondary | ICD-10-CM

## 2022-11-01 DIAGNOSIS — F411 Generalized anxiety disorder: Secondary | ICD-10-CM

## 2022-11-01 DIAGNOSIS — E785 Hyperlipidemia, unspecified: Secondary | ICD-10-CM

## 2022-11-01 DIAGNOSIS — R748 Abnormal levels of other serum enzymes: Secondary | ICD-10-CM

## 2022-11-01 DIAGNOSIS — I6523 Occlusion and stenosis of bilateral carotid arteries: Secondary | ICD-10-CM

## 2022-11-01 DIAGNOSIS — F320 Major depressive disorder, single episode, mild: Secondary | ICD-10-CM

## 2022-11-01 DIAGNOSIS — J432 Centrilobular emphysema: Secondary | ICD-10-CM

## 2022-11-03 ENCOUNTER — Telehealth: Payer: Self-pay | Admitting: Nurse Practitioner

## 2022-11-03 NOTE — Telephone Encounter (Signed)
Tobi Bastos from [facility] called request to have provider contact the patient as they have been unsuccessful. Wanted patient to know she has not adhered to the medication losartan (COZAAR) 100 MG tablet and need to contact [facility]

## 2022-11-03 NOTE — Telephone Encounter (Signed)
LVM for patient to return call. 

## 2022-11-09 ENCOUNTER — Telehealth: Payer: Self-pay | Admitting: Nurse Practitioner

## 2022-11-09 NOTE — Telephone Encounter (Signed)
[name] with [facility] is calling with pt because pt says she was instructed to take losartan (COZAAR) 100 MG tablet [[id number]] by cutting the pill in half and taking it when blood pressure is a certain number instead of taking one full tablet per day. Pt says her provider was the one who told her to cut the pills in half. Please follow up with pt on correct way to take the medication.

## 2022-11-10 NOTE — Telephone Encounter (Signed)
LVM for patient to return call. 

## 2022-11-13 NOTE — Telephone Encounter (Signed)
Patient made aware of Provider's recommendations and verbalized understanding.   

## 2022-11-21 ENCOUNTER — Telehealth: Payer: Self-pay | Admitting: *Deleted

## 2022-11-21 NOTE — Telephone Encounter (Signed)
  Chief Complaint: Results Symptoms: NA Frequency: NA Pertinent Negatives: Patient denies NA Disposition: [] ED /[] Urgent Care (no appt availability in office) / [] Appointment(In office/virtual)/ []  [facility]/ [] Home Care/ [] Refused Recommended Disposition /[] [facility]/ [x]  Follow-up with PCP Additional Notes:  Pt called for results from allergy testing. Result note read to pt, verbalizes understanding.  Pt requesting to secure appt for annual physical, insists she had one last year, not noted in chart. Assured NT would route to practice for review as did not want to secure Annual too early.

## 2022-12-17 NOTE — Patient Instructions (Signed)
Please call to schedule your mammogram and/or bone density: [facility] at [facility]  Address: [address], [place], [place] [address] Phone: [phone]  [facility] [address] [place],  [place]  [address] Phone: [phone]   Heart-Healthy Eating Plan Many factors influence your heart health, including eating and exercise habits. Heart health is also called coronary health. Coronary risk increases with abnormal blood fat (lipid) levels. A heart-healthy eating plan includes limiting unhealthy fats, increasing healthy fats, limiting salt (sodium) intake, and making other diet and lifestyle changes. What is my plan? Your health care provider may recommend that: You limit your fat intake to _________% or less of your total calories each day. You limit your saturated fat intake to _________% or less of your total calories each day. You limit the amount of cholesterol in your diet to less than _________ mg per day. You limit the amount of sodium in your diet to less than _________ mg per day. What are tips for following this plan? Cooking Cook foods using methods other than frying. Baking, boiling, grilling, and broiling are all good options. Other ways to reduce fat include: Removing the skin from poultry. Removing all visible fats from meats. Steaming vegetables in water or broth. Meal planning  At meals, imagine dividing your plate into fourths: Fill one-half of your plate with vegetables and green salads. Fill one-fourth of your plate with whole grains. Fill one-fourth of your plate with lean protein foods. Eat 2-4 cups of vegetables per day. One cup of vegetables equals 1 cup (91 g) broccoli or cauliflower florets, 2 medium carrots, 1 large bell pepper, 1 large sweet potato, 1 large tomato, 1 medium white potato, 2 cups (150 g) raw leafy greens. Eat 1-2 cups of fruit per day. One cup of fruit equals 1 small  apple, 1 large banana, 1 cup (237 g) mixed fruit, 1 large orange,  cup (82 g) dried fruit, 1 cup (240 mL) 100% fruit juice. Eat more foods that contain soluble fiber. Examples include apples, broccoli, carrots, beans, peas, and barley. Aim to get 25-30 g of fiber per day. Increase your consumption of legumes, nuts, and seeds to 4-5 servings per week. One serving of dried beans or legumes equals  cup (90 g) cooked, 1 serving of nuts is  oz (12 almonds, 24 pistachios, or 7 walnut halves), and 1 serving of seeds equals  oz (8 g). Fats Choose healthy fats more often. Choose monounsaturated and polyunsaturated fats, such as olive and canola oils, avocado oil, flaxseeds, walnuts, almonds, and seeds. Eat more omega-3 fats. Choose salmon, mackerel, sardines, tuna, flaxseed oil, and ground flaxseeds. Aim to eat fish at least 2 times each week. Check food labels carefully to identify foods with trans fats or high amounts of saturated fat. Limit saturated fats. These are found in animal products, such as meats, butter, and cream. Plant sources of saturated fats include palm oil, palm kernel oil, and coconut oil. Avoid foods with partially hydrogenated oils in them. These contain trans fats. Examples are stick margarine, some tub margarines, cookies, crackers, and other baked goods. Avoid fried foods. General information Eat more home-cooked food and less restaurant, buffet, and fast food. Limit or avoid alcohol. Limit foods that are high in added sugar and simple starches such as foods made using white refined flour (white breads, pastries, sweets). Lose weight if you are overweight. Losing just 5-10% of your body weight can help your  overall health and prevent diseases such as diabetes and heart disease. Monitor your sodium intake, especially if you have high blood pressure. Talk with your health care provider about your sodium intake. Try to incorporate more vegetarian meals weekly. What foods should I  eat? Fruits All fresh, canned (in natural juice), or frozen fruits. Vegetables Fresh or frozen vegetables (raw, steamed, roasted, or grilled). Green salads. Grains Most grains. Choose whole wheat and whole grains most of the time. Rice and pasta, including brown rice and pastas made with whole wheat. Meats and other proteins Lean, well-trimmed beef, veal, pork, and lamb. Chicken and [place] without skin. All fish and shellfish. Wild duck, rabbit, pheasant, and venison. Egg whites or low-cholesterol egg substitutes. Dried beans, peas, lentils, and tofu. Seeds and most nuts. Dairy Low-fat or nonfat cheeses, including ricotta and mozzarella. Skim or 1% milk (liquid, powdered, or evaporated). Buttermilk made with low-fat milk. Nonfat or low-fat yogurt. Fats and oils Non-hydrogenated (trans-free) margarines. Vegetable oils, including soybean, sesame, sunflower, olive, avocado, peanut, safflower, corn, canola, and cottonseed. Salad dressings or mayonnaise made with a vegetable oil. Beverages Water (mineral or sparkling). Coffee and tea. Unsweetened ice tea. Diet beverages. Sweets and desserts Sherbet, gelatin, and fruit ice. Small amounts of dark chocolate. Limit all sweets and desserts. Seasonings and condiments All seasonings and condiments. The items listed above may not be a complete list of foods and beverages you can eat. Contact a dietitian for more options. What foods should I avoid? Fruits Canned fruit in heavy syrup. Fruit in cream or butter sauce. Fried fruit. Limit coconut. Vegetables Vegetables cooked in cheese, cream, or butter sauce. Fried vegetables. Grains Breads made with saturated or trans fats, oils, or whole milk. Croissants. Sweet rolls. Donuts. High-fat crackers, such as cheese crackers and chips. Meats and other proteins Fatty meats, such as hot dogs, ribs, sausage, bacon, rib-eye roast or steak. High-fat deli meats, such as salami and bologna. Caviar. Domestic duck and  goose. Organ meats, such as liver. Dairy Cream, sour cream, cream cheese, and creamed cottage cheese. Whole-milk cheeses. Whole or 2% milk (liquid, evaporated, or condensed). Whole buttermilk. Cream sauce or high-fat cheese sauce. Whole-milk yogurt. Fats and oils Meat fat, or shortening. Cocoa butter, hydrogenated oils, palm oil, coconut oil, palm kernel oil. Solid fats and shortenings, including bacon fat, salt pork, lard, and butter. Nondairy cream substitutes. Salad dressings with cheese or sour cream. Beverages Regular sodas and any drinks with added sugar. Sweets and desserts Frosting. Pudding. Cookies. Cakes. Pies. Milk chocolate or white chocolate. Buttered syrups. Full-fat ice cream or ice cream drinks. The items listed above may not be a complete list of foods and beverages to avoid. Contact a dietitian for more information. Summary Heart-healthy meal planning includes limiting unhealthy fats, increasing healthy fats, limiting salt (sodium) intake and making other diet and lifestyle changes. Lose weight if you are overweight. Losing just 5-10% of your body weight can help your overall health and prevent diseases such as diabetes and heart disease. Focus on eating a balance of foods, including fruits and vegetables, low-fat or nonfat dairy, lean protein, nuts and legumes, whole grains, and heart-healthy oils and fats. This information is not intended to replace advice given to you by your health care provider. Make sure you discuss any questions you have with your health care provider. Document Revised: 07/18/2021 Document Reviewed: 07/18/2021 Elsevier Patient Education  2024 [employer].

## 2022-12-19 ENCOUNTER — Encounter: Payer: Self-pay | Admitting: Nurse Practitioner

## 2022-12-19 ENCOUNTER — Ambulatory Visit: Payer: No Typology Code available for payment source | Admitting: Nurse Practitioner

## 2022-12-19 VITALS — BP 127/88 | HR 87 | Temp 98.2°F | Ht 62.01 in | Wt 166.4 lb

## 2022-12-19 DIAGNOSIS — H539 Unspecified visual disturbance: Secondary | ICD-10-CM

## 2022-12-19 DIAGNOSIS — E559 Vitamin D deficiency, unspecified: Secondary | ICD-10-CM | POA: Diagnosis not present

## 2022-12-19 DIAGNOSIS — E785 Hyperlipidemia, unspecified: Secondary | ICD-10-CM | POA: Diagnosis not present

## 2022-12-19 DIAGNOSIS — F320 Major depressive disorder, single episode, mild: Secondary | ICD-10-CM

## 2022-12-19 DIAGNOSIS — E66811 Obesity, class 1: Secondary | ICD-10-CM

## 2022-12-19 DIAGNOSIS — I6523 Occlusion and stenosis of bilateral carotid arteries: Secondary | ICD-10-CM

## 2022-12-19 DIAGNOSIS — E669 Obesity, unspecified: Secondary | ICD-10-CM

## 2022-12-19 DIAGNOSIS — Z0000 Encounter for general adult medical examination without abnormal findings: Secondary | ICD-10-CM | POA: Diagnosis not present

## 2022-12-19 DIAGNOSIS — D582 Other hemoglobinopathies: Secondary | ICD-10-CM | POA: Diagnosis not present

## 2022-12-19 DIAGNOSIS — Z1231 Encounter for screening mammogram for malignant neoplasm of breast: Secondary | ICD-10-CM

## 2022-12-19 DIAGNOSIS — I10 Essential (primary) hypertension: Secondary | ICD-10-CM

## 2022-12-19 DIAGNOSIS — J432 Centrilobular emphysema: Secondary | ICD-10-CM | POA: Diagnosis not present

## 2022-12-19 DIAGNOSIS — F411 Generalized anxiety disorder: Secondary | ICD-10-CM

## 2022-12-19 DIAGNOSIS — R748 Abnormal levels of other serum enzymes: Secondary | ICD-10-CM

## 2022-12-19 DIAGNOSIS — Z87891 Personal history of nicotine dependence: Secondary | ICD-10-CM

## 2022-12-19 NOTE — Assessment & Plan Note (Signed)
Chronic, stable.  BP at goal in office and on home checks.  Recommend she monitor BP at least a few mornings a week at home and document.  DASH diet at home.  Continue current medication regimen and adjust as needed, may be able to reduce Amlodipine back down in future if BP remains stable.  Labs: CBC, CMP, TSH.  Refills up to date.  Return in 6 months.

## 2022-12-19 NOTE — Assessment & Plan Note (Signed)
Recommend she continue yearly lung screening and to schedule this, provided pamphlet with number to call and schedule.  Referral placed last visit. 

## 2022-12-19 NOTE — Assessment & Plan Note (Signed)
Ongoing and stable.  Continue supplement and check Vit D level today. 

## 2022-12-19 NOTE — Assessment & Plan Note (Signed)
Check CMP and GGT today + ESR and CRP.  Consider repeat imaging in future.

## 2022-12-19 NOTE — Assessment & Plan Note (Signed)
Chronic, stable with no inhalers.  Will plan on spirometry next visit.  If worsening symptoms initiate inhaler regimen.  Continue yearly lung screening -- recommend she call to schedule this and pamphlet provided.  Referral placed last visit. 

## 2022-12-19 NOTE — Assessment & Plan Note (Signed)
Chronic, ongoing.  Denies SI/HI.  Reports benefit from current regimen, will continue this and send refills as needed.  Continue therapy visits as needed.  Return in 6 months. 

## 2022-12-19 NOTE — Assessment & Plan Note (Signed)
Refer to depression plan of care. 

## 2022-12-19 NOTE — Assessment & Plan Note (Signed)
Chronic, ongoing.  Continue current medication regimen and adjust as needed, monitor LFT as has history of elevation.  Recommend she take both Zetia and Pravastatin.  May consider adjusting statin regimen if ongoing elevations due to family history, could consider Praluent or Repatha. 

## 2022-12-19 NOTE — Assessment & Plan Note (Addendum)
BMI 30.43.  Recommended eating smaller high protein, low fat meals more frequently and exercising 30 mins a day 5 times a week with a goal of 10-15lb weight loss in the next 3 months. Patient voiced their understanding and motivation to adhere to these recommendations. ° °

## 2022-12-19 NOTE — Progress Notes (Addendum)
BP 127/88   Pulse 87   Temp 98.2 F (36.8 C) (Oral)   Ht 5' 2.01" (1.575 m)   Wt 166 lb 6.4 oz (75.5 kg)   SpO2 96%   BMI 30.43 kg/m    Subjective:    Patient ID: Allison Pham, female    DOB: [birth year], 62 y.o.   MRN: [id number]  HPI: Allison Pham is a 62 y.o. female presenting on 12/19/2022 for Medicare Wellness and follow-up. Current medical complaints include:none  She currently lives with: daughter Menopausal Symptoms: no  HYPERTENSION/HLD Currently taking Amlodipine 10 MG and Losartan 100 MG daily. Taking Zetia 10 MG and Pravastatin 40 MG daily for HLD.  Previous smoker, quit 4-5 years ago.    Ongoing elevation in GGT on labs. Satisfied with current treatment? yes Duration of hypertension: chronic BP monitoring frequency: twice daily BP range: 120-130/80 range at home BP medication side effects: no Past BP meds: multiple kinds Duration of hyperlipidemia: chronic Cholesterol medication side effects: no Cholesterol supplements: none Past cholesterol medications: multiple different kinds Medication compliance: good compliance Aspirin: no Recent stressors: no Recurrent headaches: no Visual changes: yes, more blurry over past month Palpitations: no Dyspnea: no Chest pain: no Lower extremity edema: no Dizzy/lightheaded: no  The 10-year ASCVD risk score (Arnett DK, et al., 2019) is: 6.4%   Values used to calculate the score:     Age: 24 years     Sex: Female     Is Non-Hispanic African American: No     Diabetic: No     Tobacco smoker: No     Systolic Blood Pressure: 123 mmHg     Is BP treated: Yes     HDL Cholesterol: 42 mg/dL     Total Cholesterol: 201 mg/dL  COPD Centrilobular mild noted on lung CT 2019.  She is a past smoker -- off and on, was a 1 PPD smoker in past.  Quit >6 years ago.  No current symptoms or inhalers.  There are smokers in her household. Would like to perform lung cancer screening in future -- but will obtain in future due to life  stressors at this time. COPD status: stable Satisfied with current treatment?: yes Oxygen use: no Dyspnea frequency:  none Cough frequency: none Limitation of activity: no Productive cough: no Last Spirometry: unknown Pneumovax:  refuses Influenza:  refuses  ANXIETY/DEPRESSION Continues on Buspar, Gabapentin, and Trazodone.  Continues on Vitamin D supplement weekly for history of lows. Mood status: stable Satisfied with current treatment?: yes Symptom severity: moderate  Duration of current treatment : chronic Side effects: no Medication compliance: good compliance Psychotherapy/counseling: none Depressed mood: no Anxious mood: occasional Anhedonia: no Significant weight loss or gain: no Insomnia: none Fatigue: no Feelings of worthlessness or guilt: no Impaired concentration/indecisiveness: no Suicidal ideations: no Hopelessness: no Crying spells: no    06/22/2023   10:59 AM 03/27/2023   11:21 AM 12/19/2022   11:22 AM 10/23/2022    3:00 PM 05/03/2022   10:20 AM  Depression screen PHQ 2/9  Decreased Interest 1 2 1 2 1   Down, Depressed, Hopeless 2 1 1 1 1   PHQ - 2 Score 3 3 2 3 2   Altered sleeping 1 2 1 1 1   Tired, decreased energy 2 2 1 2 2   Change in appetite 1 1 0 1 1  Feeling bad or failure about yourself  1 1 1 2 1   Trouble concentrating 1 2 0 1 2  Moving slowly or fidgety/restless 0  1 0 0 0  Suicidal thoughts 0 0 0 0 0  PHQ-9 Score 9 12 5 10 9   Difficult doing work/chores Somewhat difficult Not difficult at all Not difficult at all  Not difficult at all       06/22/2023   10:59 AM 03/27/2023   11:22 AM 12/19/2022   11:22 AM 10/23/2022    3:00 PM  GAD 7 : Generalized Anxiety Score  Nervous, Anxious, on Edge 1 2 1 1   Control/stop worrying 1 1 0 1  Worry too much - different things 1 1 1 2   Trouble relaxing 2 1 1 2   Restless 1 1 0 1  Easily annoyed or irritable 1 1 1 1   Afraid - awful might happen 1 1 0 1  Total GAD 7 Score 8 8 4 9   Anxiety Difficulty  Somewhat difficult Somewhat difficult Not difficult at all           11/10/2021   10:41 AM 01/31/2022    9:26 AM 01/31/2022   10:03 AM 03/27/2023   11:21 AM 06/22/2023   10:58 AM  Fall Risk  Falls in the past year? 0 0 0 0 0  Was there an injury with Fall? 0 0 0 0 0  Fall Risk Category Calculator 0 0 0 0 0  Fall Risk Category (Retired) Low Low Low    (RETIRED) Patient Fall Risk Level Low fall risk Low fall risk Low fall risk    Patient at Risk for Falls Due to No Fall Risks No Fall Risks No Fall Risks No Fall Risks No Fall Risks  Fall risk Follow up Falls evaluation completed Falls evaluation completed Falls evaluation completed Falls evaluation completed Falls evaluation completed    Functional Status Survey: Is the patient deaf or have difficulty hearing?: No Does the patient have difficulty seeing, even when wearing glasses/contacts?: No Does the patient have difficulty concentrating, remembering, or making decisions?: No Does the patient have difficulty walking or climbing stairs?: No Does the patient have difficulty dressing or bathing?: No Does the patient have difficulty doing errands alone such as visiting a doctor's office or shopping?: No    Past Medical History:  Past Medical History:  Diagnosis Date   Anxiety    Carotid atherosclerosis, bilateral 07/16/2018   rescan Jan 2021   Emphysema of lung (HCC) 03/21/2018   GERD (gastroesophageal reflux disease)    Hypercholesteremia    Hypertension    Tick bite    Vertigo    Wears dentures    partial upper    Surgical History:  Past Surgical History:  Procedure Laterality Date   ABDOMINAL HYSTERECTOMY     Partial; bladder suspension   COLONOSCOPY N/A 01/28/2021   Procedure: COLONOSCOPY;  Surgeon: Midge Minium, MD;  Location: [facility];  Service: Endoscopy;  Laterality: N/A;   COLONOSCOPY WITH PROPOFOL N/A 05/10/2017   Procedure: COLONOSCOPY WITH PROPOFOL;  Surgeon: Midge Minium, MD;  Location: [facility];  Service: Endoscopy;  Laterality: N/A;   HEMORRHOID SURGERY     POLYPECTOMY N/A 05/10/2017   Procedure: POLYPECTOMY;  Surgeon: Midge Minium, MD;  Location: [facility];  Service: Endoscopy;  Laterality: N/A;   POLYPECTOMY  01/28/2021   Procedure: POLYPECTOMY;  Surgeon: Midge Minium, MD;  Location: [facility];  Service: Endoscopy;;    Medications:  Current Outpatient Medications on File Prior to Visit  Medication Sig   amLODipine (NORVASC) 5 MG tablet Take 2 tablets (10 mg total) by mouth daily.  aspirin 81 MG chewable tablet Chew 81 mg by mouth as needed.    busPIRone (BUSPAR) 5 MG tablet Take 0.5-1 tablets (2.5-5 mg total) by mouth 3 (three) times daily as needed.   clobetasol cream (TEMOVATE) 0.05 % Apply 1 Application topically 2 (two) times daily.   ezetimibe (ZETIA) 10 MG tablet Take 1 tablet (10 mg total) by mouth daily.   gabapentin (NEURONTIN) 300 MG capsule Take 1 capsule (300 mg total) by mouth at bedtime as needed.   loratadine (CLARITIN) 10 MG tablet Take 1 tablet (10 mg total) by mouth daily as needed for allergies.   losartan (COZAAR) 100 MG tablet Take 1 tablet (100 mg total) by mouth daily.   traZODone (DESYREL) 50 MG tablet Take 0.5 tablets (25 mg total) by mouth at bedtime.   valACYclovir (VALTREX) 1000 MG tablet Take 1 tablet (1,000 mg total) by mouth 2 (two) times daily. Take for 7 days and then stop for cold sores. (Patient not taking: Reported on 06/22/2023)   Vitamin D, Ergocalciferol, (DRISDOL) 1.25 MG (50000 UNIT) CAPS capsule Take 1 capsule (50,000 Units total) by mouth once a week.   No current facility-administered medications on file prior to visit.    Allergies:  Allergies  Allergen Reactions   Latex Rash   Penicillins Rash    Social History:  Social History   Socioeconomic History   Marital status: Single    Spouse name: Not on file   Number of children: Not on file   Years of education: Not on file   Highest education  level: Not on file  Occupational History   Not on file  Tobacco Use   Smoking status: Former    Current packs/day: 0.00    Average packs/day: 1 pack/day for 38.0 years (38.0 ttl pk-yrs)    Types: Cigarettes    Start date: 09/12/1977    Quit date: 09/13/2015    Years since quitting: 7.7   Smokeless tobacco: Never  Vaping Use   Vaping status: Former  Substance and Sexual Activity   Alcohol use: Never    Comment: rare - holidays   Drug use: No   Sexual activity: Not Currently    Birth control/protection: Surgical  Other Topics Concern   Not on file  Social History Narrative   Not on file   Social Drivers of Health   Financial Resource Strain: Low Risk  (12/19/2022)   Overall Financial Resource Strain (CARDIA)    Difficulty of Paying Living Expenses: Not hard at all  Food Insecurity: No Food Insecurity (12/19/2022)   Hunger Vital Sign    Worried About Running Out of Food in the Last Year: Never true    Ran Out of Food in the Last Year: Never true  Transportation Needs: No Transportation Needs (12/19/2022)   PRAPARE - Administrator, Civil Service (Medical): No    Lack of Transportation (Non-Medical): No  Physical Activity: Insufficiently Active (12/19/2022)   Exercise Vital Sign    Days of Exercise per Week: 5 days    Minutes of Exercise per Session: 20 min  Stress: Stress Concern Present (12/19/2022)   [employer] of Occupational Health - Occupational Stress Questionnaire    Feeling of Stress : To some extent  Social Connections: Socially Isolated (12/19/2022)   Social Connection and Isolation Panel [NHANES]    Frequency of Communication with Friends and Family: More than three times a week    Frequency of Social Gatherings with Friends and Family: More than  three times a week    Attends Religious Services: Never    Active Member of Clubs or Organizations: No    Attends Banker Meetings: Never    Marital Status: Divorced  Catering manager  Violence: Not At Risk (12/19/2022)   Humiliation, Afraid, Rape, and Kick questionnaire    Fear of Current or Ex-Partner: No    Emotionally Abused: No    Physically Abused: No    Sexually Abused: No   Social History   Tobacco Use  Smoking Status Former   Current packs/day: 0.00   Average packs/day: 1 pack/day for 38.0 years (38.0 ttl pk-yrs)   Types: Cigarettes   Start date: 09/12/1977   Quit date: 09/13/2015   Years since quitting: 7.7  Smokeless Tobacco Never   Social History   Substance and Sexual Activity  Alcohol Use Never   Comment: rare - holidays    Family History:  Family History  Problem Relation Age of Onset   Cancer Mother        Colon   Hypertension Mother    Hypertension Father    Heart attack Father    Heart disease Father    Hypertension Sister    COPD Sister    Hypertension Brother    Heart disease Brother    Cancer Maternal Aunt    Breast cancer Maternal Aunt    Cancer Maternal Uncle    Breast cancer Maternal Aunt    Diabetes Neg Hx    Stroke Neg Hx     Past medical history, surgical history, medications, allergies, family history and social history reviewed with patient today and changes made to appropriate areas of the chart.   ROS All other ROS negative except what is listed above and in the HPI.      Objective:    BP 127/88   Pulse 87   Temp 98.2 F (36.8 C) (Oral)   Ht 5' 2.01" (1.575 m)   Wt 166 lb 6.4 oz (75.5 kg)   SpO2 96%   BMI 30.43 kg/m   Wt Readings from Last 3 Encounters:  06/22/23 168 lb 9.6 oz (76.5 kg)  03/27/23 168 lb 9.6 oz (76.5 kg)  12/19/22 166 lb 6.4 oz (75.5 kg)    Physical Exam Vitals and nursing note reviewed. Exam conducted with a chaperone present.  Constitutional:      General: She is awake. She is not in acute distress.    Appearance: She is well-developed and well-groomed. She is obese. She is not ill-appearing or toxic-appearing.  HENT:     Head: Normocephalic and atraumatic.     Right Ear:  Hearing, tympanic membrane, ear canal and external ear normal. No drainage.     Left Ear: Hearing, tympanic membrane, ear canal and external ear normal. No drainage.     Nose: Nose normal.     Right Sinus: No maxillary sinus tenderness or frontal sinus tenderness.     Left Sinus: No maxillary sinus tenderness or frontal sinus tenderness.     Mouth/Throat:     Mouth: Mucous membranes are moist.     Pharynx: Oropharynx is clear. Uvula midline. No pharyngeal swelling, oropharyngeal exudate or posterior oropharyngeal erythema.  Eyes:     General: Lids are normal.        Right eye: No discharge.        Left eye: No discharge.     Extraocular Movements: Extraocular movements intact.     Conjunctiva/sclera: Conjunctivae normal.  Pupils: Pupils are equal, round, and reactive to light.     Visual Fields: Right eye visual fields normal and left eye visual fields normal.  Neck:     Thyroid: No thyromegaly.     Vascular: No carotid bruit.     Trachea: Trachea normal.  Cardiovascular:     Rate and Rhythm: Normal rate and regular rhythm.     Heart sounds: Normal heart sounds. No murmur heard.    No gallop.  Pulmonary:     Effort: Pulmonary effort is normal. No accessory muscle usage or respiratory distress.     Breath sounds: Normal breath sounds.  Chest:  Breasts:    Right: Normal.     Left: Normal.  Abdominal:     General: Bowel sounds are normal.     Palpations: Abdomen is soft. There is no hepatomegaly or splenomegaly.     Tenderness: There is no abdominal tenderness.  Musculoskeletal:        General: Normal range of motion.     Cervical back: Normal range of motion and neck supple.     Right lower leg: No edema.     Left lower leg: No edema.  Lymphadenopathy:     Head:     Right side of head: No submental, submandibular, tonsillar, preauricular or posterior auricular adenopathy.     Left side of head: No submental, submandibular, tonsillar, preauricular or posterior auricular  adenopathy.     Cervical: No cervical adenopathy.     Upper Body:     Right upper body: No supraclavicular, axillary or pectoral adenopathy.     Left upper body: No supraclavicular, axillary or pectoral adenopathy.  Skin:    General: Skin is warm and dry.     Capillary Refill: Capillary refill takes less than 2 seconds.     Findings: No rash.  Neurological:     Mental Status: She is alert and oriented to person, place, and time.     Gait: Gait is intact.     Deep Tendon Reflexes: Reflexes are normal and symmetric.     Reflex Scores:      Brachioradialis reflexes are 2+ on the right side and 2+ on the left side.      Patellar reflexes are 2+ on the right side and 2+ on the left side. Psychiatric:        Attention and Perception: Attention normal.        Mood and Affect: Mood normal.        Speech: Speech normal.        Behavior: Behavior normal. Behavior is cooperative.        Thought Content: Thought content normal.        Judgment: Judgment normal.       12/19/2022   11:33 AM  6CIT Screen  What Year? 0 points  What month? 0 points  What time? 0 points  Count back from 20 0 points  Months in reverse 0 points  Repeat phrase 0 points  Total Score 0 points   Results for orders placed or performed in visit on 12/19/22  CBC with Differential/Platelet   Collection Time: 12/19/22 11:03 AM  Result Value Ref Range   WBC 9.4 3.4 - 10.8 x10E3/uL   RBC 5.63 (H) 3.77 - 5.28 x10E6/uL   Hemoglobin 16.5 (H) 11.1 - 15.9 g/dL   Hematocrit 16.1 (H) 09.6 - 46.6 %   MCV 87 79 - 97 fL   MCH 29.3 26.6 - 33.0 pg  MCHC 33.9 31.5 - 35.7 g/dL   RDW 41.3 24.4 - 01.0 %   Platelets 285 150 - 450 x10E3/uL   Neutrophils 44 Not Estab. %   Lymphs 45 Not Estab. %   Monocytes 8 Not Estab. %   Eos 2 Not Estab. %   Basos 1 Not Estab. %   Neutrophils Absolute 4.2 1.4 - 7.0 x10E3/uL   Lymphocytes Absolute 4.2 (H) 0.7 - 3.1 x10E3/uL   Monocytes Absolute 0.7 0.1 - 0.9 x10E3/uL   EOS (ABSOLUTE) 0.2  0.0 - 0.4 x10E3/uL   Basophils Absolute 0.1 0.0 - 0.2 x10E3/uL   Immature Granulocytes 0 Not Estab. %   Immature Grans (Abs) 0.0 0.0 - 0.1 x10E3/uL  Comprehensive metabolic panel   Collection Time: 12/19/22 11:03 AM  Result Value Ref Range   Glucose 101 (H) 70 - 99 mg/dL   BUN 14 8 - 27 mg/dL   Creatinine, Ser 2.72 0.57 - 1.00 mg/dL   eGFR 68 >53 GU/YQI/3.47   BUN/Creatinine Ratio 15 12 - 28   Sodium 142 134 - 144 mmol/L   Potassium 4.2 3.5 - 5.2 mmol/L   Chloride 106 96 - 106 mmol/L   CO2 20 20 - 29 mmol/L   Calcium 9.5 8.7 - 10.3 mg/dL   Total Protein 6.8 6.0 - 8.5 g/dL   Albumin 4.1 3.9 - 4.9 g/dL   Globulin, Total 2.7 1.5 - 4.5 g/dL   Bilirubin Total 0.5 0.0 - 1.2 mg/dL   Alkaline Phosphatase 108 44 - 121 IU/L   AST 21 0 - 40 IU/L   ALT 20 0 - 32 IU/L  Lipid Panel w/o Chol/HDL Ratio   Collection Time: 12/19/22 11:03 AM  Result Value Ref Range   Cholesterol, Total 256 (H) 100 - 199 mg/dL   Triglycerides 425 (H) 0 - 149 mg/dL   HDL 39 (L) >95 mg/dL   VLDL Cholesterol Cal 35 5 - 40 mg/dL   LDL Chol Calc (NIH) 638 (H) 0 - 99 mg/dL  VITAMIN D 25 Hydroxy (Vit-D Deficiency, Fractures)   Collection Time: 12/19/22 11:03 AM  Result Value Ref Range   Vit D, 25-Hydroxy 70.2 30.0 - 100.0 Pham/mL  Gamma GT   Collection Time: 12/19/22 11:03 AM  Result Value Ref Range   GGT 165 (H) 0 - 60 IU/L  C-reactive protein   Collection Time: 12/19/22 11:03 AM  Result Value Ref Range   CRP 3 0 - 10 mg/L  Sed Rate (ESR)   Collection Time: 12/19/22 11:03 AM  Result Value Ref Range   Sed Rate 43 (H) 0 - 40 mm/hr  TSH   Collection Time: 12/19/22 11:03 AM  Result Value Ref Range   TSH 4.260 0.450 - 4.500 uIU/mL      Assessment & Plan:   Problem List Items Addressed This Visit       Cardiovascular and Mediastinum   Essential hypertension, benign (Chronic)   Chronic, stable.  BP at goal in office and on home checks.  Recommend she monitor BP at least a few mornings a week at home and  document.  DASH diet at home.  Continue current medication regimen and adjust as needed, may be able to reduce Amlodipine back down in future if BP remains stable.  Labs: CBC, CMP, TSH.  Refills up to date.  Return in 6 months.      Relevant Orders   TSH   Carotid atherosclerosis, bilateral   Noted on scan in January 2020, will order repeat  scan to reassess next visit, she can not attend at this time.  Continue statin therapy.        Respiratory   Centrilobular emphysema (HCC) (Chronic)   Chronic, stable with no inhalers.  Will plan on spirometry next visit.  If worsening symptoms initiate inhaler regimen.  Continue yearly lung screening -- recommend she call to schedule this and pamphlet provided.  Referral placed last visit.      Relevant Orders   CBC with Differential/Platelet (Completed)     Other   Depression, major, single episode, mild (HCC) (Chronic)   Chronic, ongoing.  Denies SI/HI.  Reports benefit from current regimen, will continue this and send refills as needed.  Continue therapy visits as needed.  Return in 6 months.      Dyslipidemia (Chronic)   Chronic, ongoing.  Continue current medication regimen and adjust as needed, monitor LFT as has history of elevation.  Recommend she take both Zetia and Pravastatin.  May consider adjusting statin regimen if ongoing elevations due to family history, could consider Praluent or Repatha.      Relevant Orders   Comprehensive metabolic panel (Completed)   Lipid Panel w/o Chol/HDL Ratio (Completed)   Elevated serum GGT level (Chronic)   Check CMP and GGT today + ESR and CRP.  Consider repeat imaging in future.      Relevant Orders   Comprehensive metabolic panel (Completed)   Gamma GT (Completed)   Generalized anxiety disorder (Chronic)   Refer to depression plan of care.      Obesity (BMI 30.0-34.9) (Chronic)   BMI 30.43. Recommended eating smaller high protein, low fat meals more frequently and exercising 30 mins a day 5  times a week with a goal of 10-15lb weight loss in the next 3 months. Patient voiced their understanding and motivation to adhere to these recommendations.          Vitamin D deficiency (Chronic)   Ongoing and stable.  Continue supplement and check Vit D level today.      Relevant Orders   VITAMIN D 25 Hydroxy (Vit-D Deficiency, Fractures) (Completed)   Elevated hemoglobin (HCC)   Relevant Orders   CBC with Differential/Platelet (Completed)   C-reactive protein (Completed)   Sed Rate (ESR) (Completed)   Smoking history   Recommend she continue yearly lung screening and to schedule this, provided pamphlet with number to call and schedule.  Referral placed last visit.      Other Visit Diagnoses       Medicare annual wellness visit, initial    -  Primary   Medicare wellness due and performed with patient today.     Changes in vision       Referral placed to eye doctor.   Relevant Orders   Ambulatory referral to Ophthalmology     Encounter for screening mammogram for malignant neoplasm of breast       Mammogram ordered and instructed on where to call to schedule.   Relevant Orders   MM 3D SCREENING MAMMOGRAM BILATERAL BREAST (Completed)        Follow up plan: Return in about 6 months (around 06/20/2023) for HTN/HLD, COPD, ANXIETY -- need spirometry.   LABORATORY TESTING:  - Pap smear: up to date  IMMUNIZATIONS:   - Tdap: Tetanus vaccination status reviewed: last tetanus booster within 10 years. - Influenza: Refused - Pneumovax: Not applicable - Prevnar: Refused - COVID: Refused - HPV: Not applicable - Shingrix vaccine: Up to date  SCREENING: -Mammogram: Ordered today  -  Colonoscopy: Up to date  - Bone Density: Not applicable  -Hearing Test: Not applicable  -Spirometry: Not applicable   PATIENT COUNSELING:   Advised to take 1 mg of folate supplement per day if capable of pregnancy.   Sexuality: Discussed sexually transmitted diseases, partner selection, use  of condoms, avoidance of unintended pregnancy  and contraceptive alternatives.   Advised to avoid cigarette smoking.  I discussed with the patient that most people either abstain from alcohol or drink within safe limits (<=14/week and <=4 drinks/occasion for males, <=7/weeks and <= 3 drinks/occasion for females) and that the risk for alcohol disorders and other health effects rises proportionally with the number of drinks per week and how often a drinker exceeds daily limits.  Discussed cessation/primary prevention of drug use and availability of treatment for abuse.   Diet: Encouraged to adjust caloric intake to maintain  or achieve ideal body weight, to reduce intake of dietary saturated fat and total fat, to limit sodium intake by avoiding high sodium foods and not adding table salt, and to maintain adequate dietary potassium and calcium preferably from fresh fruits, vegetables, and low-fat dairy products.    Stressed the importance of regular exercise  Injury prevention: Discussed safety belts, safety helmets, smoke detector, smoking near bedding or upholstery.   Dental health: Discussed importance of regular tooth brushing, flossing, and dental visits.    NEXT PREVENTATIVE PHYSICAL DUE IN 1 YEAR. Return in about 6 months (around 06/20/2023) for HTN/HLD, COPD, ANXIETY -- need spirometry.

## 2022-12-19 NOTE — Assessment & Plan Note (Signed)
Noted on scan in January 2020, will order repeat scan to reassess next visit, she can not attend at this time.  Continue statin therapy.

## 2022-12-20 ENCOUNTER — Encounter: Payer: Self-pay | Admitting: Nurse Practitioner

## 2022-12-20 ENCOUNTER — Ambulatory Visit: Payer: Self-pay | Admitting: *Deleted

## 2022-12-20 DIAGNOSIS — D582 Other hemoglobinopathies: Secondary | ICD-10-CM | POA: Insufficient documentation

## 2022-12-20 LAB — CBC WITH DIFFERENTIAL/PLATELET
Basophils Absolute: 0.1 10*3/uL (ref 0.0–0.2)
Basos: 1 %
EOS (ABSOLUTE): 0.2 10*3/uL (ref 0.0–0.4)
Eos: 2 %
Hematocrit: 48.7 % — ABNORMAL HIGH (ref 34.0–46.6)
Hemoglobin: 16.5 g/dL — ABNORMAL HIGH (ref 11.1–15.9)
Immature Grans (Abs): 0 10*3/uL (ref 0.0–0.1)
Immature Granulocytes: 0 %
Lymphocytes Absolute: 4.2 10*3/uL — ABNORMAL HIGH (ref 0.7–3.1)
Lymphs: 45 %
MCH: 29.3 pg (ref 26.6–33.0)
MCHC: 33.9 g/dL (ref 31.5–35.7)
MCV: 87 fL (ref 79–97)
Monocytes Absolute: 0.7 10*3/uL (ref 0.1–0.9)
Monocytes: 8 %
Neutrophils Absolute: 4.2 10*3/uL (ref 1.4–7.0)
Neutrophils: 44 %
Platelets: 285 10*3/uL (ref 150–450)
RBC: 5.63 x10E6/uL — ABNORMAL HIGH (ref 3.77–5.28)
RDW: 13.4 % (ref 11.7–15.4)
WBC: 9.4 10*3/uL (ref 3.4–10.8)

## 2022-12-20 LAB — COMPREHENSIVE METABOLIC PANEL
ALT: 20 IU/L (ref 0–32)
AST: 21 IU/L (ref 0–40)
Albumin: 4.1 g/dL (ref 3.9–4.9)
Alkaline Phosphatase: 108 IU/L (ref 44–121)
BUN/Creatinine Ratio: 15 (ref 12–28)
BUN: 14 mg/dL (ref 8–27)
Bilirubin Total: 0.5 mg/dL (ref 0.0–1.2)
CO2: 20 mmol/L (ref 20–29)
Calcium: 9.5 mg/dL (ref 8.7–10.3)
Chloride: 106 mmol/L (ref 96–106)
Creatinine, Ser: 0.94 mg/dL (ref 0.57–1.00)
Globulin, Total: 2.7 g/dL (ref 1.5–4.5)
Glucose: 101 mg/dL — ABNORMAL HIGH (ref 70–99)
Potassium: 4.2 mmol/L (ref 3.5–5.2)
Sodium: 142 mmol/L (ref 134–144)
Total Protein: 6.8 g/dL (ref 6.0–8.5)
eGFR: 68 mL/min/{1.73_m2} (ref >59)

## 2022-12-20 LAB — LIPID PANEL W/O CHOL/HDL RATIO
Cholesterol, Total: 256 mg/dL — ABNORMAL HIGH (ref 100–199)
HDL: 39 mg/dL — ABNORMAL LOW (ref >39)
LDL Chol Calc (NIH): 182 mg/dL — ABNORMAL HIGH (ref 0–99)
Triglycerides: 185 mg/dL — ABNORMAL HIGH (ref 0–149)
VLDL Cholesterol Cal: 35 mg/dL (ref 5–40)

## 2022-12-20 LAB — SEDIMENTATION RATE: Sed Rate: 43 mm/hr — ABNORMAL HIGH (ref 0–40)

## 2022-12-20 LAB — TSH: TSH: 4.26 u[IU]/mL (ref 0.450–4.500)

## 2022-12-20 LAB — VITAMIN D 25 HYDROXY (VIT D DEFICIENCY, FRACTURES): Vit D, 25-Hydroxy: 70.2 ng/mL (ref 30.0–100.0)

## 2022-12-20 LAB — GAMMA GT: GGT: 165 IU/L — ABNORMAL HIGH (ref 0–60)

## 2022-12-20 LAB — C-REACTIVE PROTEIN: CRP: 3 mg/L (ref 0–10)

## 2022-12-20 NOTE — Telephone Encounter (Signed)
Reason for Disposition  [1] Follow-up call to recent contact AND [2] information only call, no triage required  Answer Assessment - Initial Assessment Questions 1. REASON FOR CALL or QUESTION: "What is your reason for calling today?" or "How can I best help you?" or "What question do you have that I can help answer?"     Pt given lab results per notes of Aura Dials, NP on 12/20/2022 at 8:41 AM  . Pt verbalized understanding.   She wants to think about seeing the hematologist and taking the injectable cholesterol lowering medication.    She will call us back.  "I just want to think on these".   I let her know that was fine just let us know what she wants to do.  Protocols used: Information Only Call - No Triage-A-AH

## 2022-12-20 NOTE — Telephone Encounter (Signed)
  Chief Complaint: Called in and was given her lab result message from Aura Dials, NP Symptoms:  Frequency:  Pertinent Negatives: Patient denies  Disposition: [] ED /[] Urgent Care (no appt availability in office) / [] Appointment(In office/virtual)/ []  [place] [facility]/ [] Home Care/ [] Refused Recommended Disposition /[] [facility]/ [x]  Follow-up with PCP Additional Notes: She will call us back with her decisions regarding the injectable cholesterol lowering medication and seeing the hematologist.

## 2022-12-20 NOTE — Progress Notes (Signed)
Please call with results per patient request: Good morning Allison Pham, your labs have returned: - CBC shows ongoing mild elevation in hemoglobin and hematocrit this has been ongoing for a little while, you may benefit a visit with hematology (the blood doctors) to further assess this and obtain recommendations.  I suspect it may be related to smoking history.  Would you be okay with referral? - Kidney and liver function are normal. - Cholesterol levels remain elevated, the Zetia and Pravastatin are not getting you to goal.  I would recommend starting an injectable cholesterol lowering medication like Repatha or Praluent that you inject every 2 weeks or monthly.  We would then stop current medications.  Would you like to try this?  Let me know. - Remainder of labs are overall stable.  Any questions? Keep being amazing!!  Thank you for allowing me to participate in your care.  I appreciate you. Kindest regards, Tracye Szuch

## 2022-12-29 ENCOUNTER — Telehealth: Payer: Self-pay | Admitting: Nurse Practitioner

## 2022-12-29 NOTE — Telephone Encounter (Signed)
Copied from CRM [id number]. Topic: General - Other >> Dec 27, 2022  4:39 PM Ja-Kwan M wrote: Reason for CRM: Koleen Nimrod with [facility] called to verify if there was a change in the Rx for losartan (COZAAR) 100 MG tablet. Koleen Nimrod reports that pt informed them that the Rx was changed to half a tablet as needed. Cb# [phone]

## 2022-12-29 NOTE — Telephone Encounter (Signed)
Called phone number provided and was unable to reach [place].

## 2023-01-18 ENCOUNTER — Telehealth: Payer: Self-pay | Admitting: Nurse Practitioner

## 2023-01-18 NOTE — Telephone Encounter (Signed)
Copied from CRM [id number]. Topic: General - Other >> Jan 17, 2023  3:52 PM Clide Dales wrote: Patient called to get more information about the injectable cholesterol medications Repatha and Praluent. Patient would like to know if this is something she needs to come into the office to receive and if so how often. If it will take the place of her current cholesterol medications. And if it will have an effect on her liver. Please advise. Patient said if you are unable to reach her you can leave a message or send her a message on MyChart.

## 2023-01-19 NOTE — Telephone Encounter (Signed)
Pt is calling to report that her insurance to cover the Repatha. Needing Allison Pham to state that it is necessary to take the medication. [employer] [phone]  Account number Member Id Number [phone]

## 2023-01-19 NOTE — Telephone Encounter (Signed)
Called and notified patient of [name]'s message.

## 2023-01-22 DIAGNOSIS — H5203 Hypermetropia, bilateral: Secondary | ICD-10-CM | POA: Diagnosis not present

## 2023-01-22 NOTE — Telephone Encounter (Signed)
Patient made aware of Provider's recommendations and verbalized understanding.   

## 2023-01-29 ENCOUNTER — Telehealth: Payer: Self-pay

## 2023-01-29 ENCOUNTER — Other Ambulatory Visit: Payer: Self-pay | Admitting: Nurse Practitioner

## 2023-01-29 MED ORDER — REPATHA SURECLICK 140 MG/ML ~~LOC~~ SOAJ: 140.0000 mg | SUBCUTANEOUS | 12 refills | Status: DC

## 2023-01-29 NOTE — Telephone Encounter (Signed)
Routing to provider. Prescription for the injection has to be sent in for the prior authorization to be initiated.

## 2023-01-29 NOTE — Telephone Encounter (Signed)
Patient came in office to request that [name] contact her insurance company regarding the shot for cholesterol. Patient states that the shot will not be approved unless [name] talks to her insurance company to tell them the pills aren't working.  Patient states that she is eager to get the shot because she does not want her cholesterol to continue to go higher. Please call patient as soon as possible to advise.

## 2023-01-30 NOTE — Telephone Encounter (Signed)
PA has been approved.   Called and LVM asking for patient to please return my call.   OK for [facility] to advise patient that the Repatha has been sent in to her pharmacy and approved by her insurance.

## 2023-01-30 NOTE — Telephone Encounter (Signed)
PA for Repatha initiated and submitted via Cover My Meds. Key: [id number]

## 2023-01-31 NOTE — Telephone Encounter (Signed)
Pt called back to report that she is allergic to Latex and that Repatha has Latex in it and she does not want to risk having a reaction. Please advise, is requesting to speak to clinic today. Is very frustrated and has questions.   She wants to know if she still needs to take her other medication for cholesterol? Among other things.

## 2023-01-31 NOTE — Telephone Encounter (Signed)
Called and LVM asking for patient to please return my call.    OK for [facility] to advise patient that the Repatha has been sent in to her pharmacy and approved by her insurance.

## 2023-02-05 MED ORDER — PRALUENT 75 MG/ML ~~LOC~~ SOAJ: 75.0000 mg | SUBCUTANEOUS | 2 refills | Status: DC

## 2023-02-05 NOTE — Telephone Encounter (Signed)
Called and spoke to [place] at [employer]. Praluent does not contain latex.

## 2023-02-05 NOTE — Addendum Note (Signed)
Addended by: Aura Dials T on: 02/05/2023 04:49 PM   Modules accepted: Orders

## 2023-02-06 NOTE — Telephone Encounter (Signed)
Patient returned my call. Explained the situation to the patient and that we are awaiting a determination from her insurance company on the appeal. Patient thanked me for calling her and letting her know the status.

## 2023-02-06 NOTE — Telephone Encounter (Signed)
PA for Praluent denied. Denial reason was because evidence was not submitted to support the need for the non preferred medication. PA request did not accept notes to be submitted for the PA request. It states "[facility] [employer] has requested that NO attachments be included for Medicare Part D ePA cases." Submitted an expedited appeal with the verbal approval from [facility] to do so. Awaiting determination on appeal.   Called and LVM asking for patient to please return my call to discuss the above.

## 2023-02-06 NOTE — Telephone Encounter (Signed)
PA for Praluent initiated and submitted via Cover My Meds. Key: [id number]

## 2023-02-09 NOTE — Telephone Encounter (Signed)
PA for the Praluent has been approved. Called [employer] and verified that medication was going through now.   Called and LVM asking for patient to please return my call.

## 2023-02-12 ENCOUNTER — Ambulatory Visit
Admission: RE | Admit: 2023-02-12 | Discharge: 2023-02-12 | Disposition: A | Discharge status: Home / Self Care | Payer: No Typology Code available for payment source | Source: Ambulatory Visit | Attending: Nurse Practitioner | Admitting: Nurse Practitioner

## 2023-02-12 DIAGNOSIS — Z1231 Encounter for screening mammogram for malignant neoplasm of breast: Secondary | ICD-10-CM | POA: Insufficient documentation

## 2023-02-15 ENCOUNTER — Ambulatory Visit: Payer: No Typology Code available for payment source

## 2023-02-22 ENCOUNTER — Telehealth: Payer: Self-pay | Admitting: Nurse Practitioner

## 2023-02-22 NOTE — Telephone Encounter (Signed)
Noted  

## 2023-02-22 NOTE — Telephone Encounter (Signed)
Just a FYI

## 2023-02-22 NOTE — Telephone Encounter (Signed)
Copied from CRM [id number]. Topic: General - Inquiry >> Feb 21, 2023  2:47 PM Allison Pham wrote: Reason for CRM: pt wants [name] to know the cholesterol shot she started is working.

## 2023-03-07 ENCOUNTER — Telehealth: Payer: Self-pay | Admitting: Nurse Practitioner

## 2023-03-07 NOTE — Telephone Encounter (Signed)
Copied from CRM [id number]. Topic: General - Other >> Mar 07, 2023  1:18 PM Everette C wrote: Reason for CRM: The patient would like to know when they need to be seen to follow up on their Alirocumab (PRALUENT) 75 MG/ML SOAJ [[id number]] prescription   The patient would like to know if/when they are due for an increase in their prescription   Please contact further when possible

## 2023-03-07 NOTE — Telephone Encounter (Signed)
Please call and schedule the patient a follow up at the beginning of October per [name] to follow up since being on the medication.

## 2023-03-07 NOTE — Telephone Encounter (Signed)
Patient has 6 month f/up scheduled in December. Does she need to be seen sooner? Does the medication get increased?

## 2023-03-08 NOTE — Telephone Encounter (Signed)
Attempted to reach patient, LVM to call office back.  Put in CRM.

## 2023-03-25 NOTE — Patient Instructions (Signed)
Wegovy or Zepbound  Focus on DASH diet for high blood pressure or Mediterranean diet  Be Involved in Caring For Your Health:  Taking Medications When medications are taken as directed, they can greatly improve your health. But if they are not taken as prescribed, they may not work. In some cases, not taking them correctly can be harmful. To help ensure your treatment remains effective and safe, understand your medications and how to take them. Bring your medications to each visit for review by your provider.  Your lab results, notes, and after visit summary will be available on My Chart. We strongly encourage you to use this feature. If lab results are abnormal the clinic will contact you with the appropriate steps. If the clinic does not contact you assume the results are satisfactory. You can always view your results on My Chart. If you have questions regarding your health or results, please contact the clinic during office hours. You can also ask questions on My Chart.  We at [facility] are grateful that you chose us to provide your care. We strive to provide evidence-based and compassionate care and are always looking for feedback. If you get a survey from the clinic please complete this so we can hear your opinions.  Preventing High Cholesterol Cholesterol is a white, waxy substance similar to fat that the human body needs to help build cells. The liver makes all the cholesterol that a person's body needs. Having high cholesterol (hypercholesterolemia) increases your risk for heart disease and stroke. Extra or excess cholesterol comes from the food that you eat. High cholesterol can often be prevented with diet and lifestyle changes. If you already have high cholesterol, you can control it with diet, lifestyle changes, and medicines. How can high cholesterol affect me? If you have high cholesterol, fatty deposits (plaques) may build up on the walls of your blood vessels. The blood  vessels that carry blood away from your heart are called arteries. Plaques make the arteries narrower and stiffer. This in turn can: Restrict or block blood flow and cause blood clots to form. Increase your risk for heart attack and stroke. What can increase my risk for high cholesterol? This condition is more likely to develop in people who: Eat foods that are high in saturated fat or cholesterol. Saturated fat is mostly found in foods that come from animal sources. Are overweight. Are not getting enough exercise. Use products that contain nicotine or tobacco, such as cigarettes, e-cigarettes, and chewing tobacco. Have a family history of high cholesterol (familial hypercholesterolemia). What actions can I take to prevent this? Nutrition  Eat less saturated fat. Avoid trans fats (partially hydrogenated oils). These are often found in margarine and in some baked goods, fried foods, and snacks bought in packages. Avoid precooked or cured meat, such as bacon, sausages, or meat loaves. Avoid foods and drinks that have added sugars. Eat more fruits, vegetables, and whole grains. Choose healthy sources of protein, such as fish, poultry, lean cuts of red meat, beans, peas, lentils, and nuts. Choose healthy sources of fat, such as: Nuts. Vegetable oils, especially olive oil. Fish that have healthy fats, such as omega-3 fatty acids. These fish include mackerel or salmon. Lifestyle Lose weight if you are overweight. Maintaining a healthy body mass index (BMI) can help prevent or control high cholesterol. It can also lower your risk for diabetes and high blood pressure. Ask your health care provider to help you with a diet and exercise plan to lose   weight safely. Do not use any products that contain nicotine or tobacco. These products include cigarettes, chewing tobacco, and vaping devices, such as e-cigarettes. If you need help quitting, ask your health care provider. Alcohol use Do not drink  alcohol if: Your health care provider tells you not to drink. You are pregnant, may be pregnant, or are planning to become pregnant. If you drink alcohol: Limit how much you have to: 0-1 drink a day for women. 0-2 drinks a day for men. Know how much alcohol is in your drink. In the [place] one drink equals one 12 oz bottle of beer (355 mL), one 5 oz glass of wine (148 mL), or one 1 oz glass of hard liquor (44 mL). Activity  Get enough exercise. Do exercises as told by your health care provider. Each week, do at least 150 minutes of exercise that takes a medium level of effort (moderate-intensity exercise). This kind of exercise: Makes your heart beat faster while allowing you to still be able to talk. Can be done in short sessions several times a day or longer sessions a few times a week. For example, on 5 days each week, you could walk fast or ride your bike 3 times a day for 10 minutes each time. Medicines Your health care provider may recommend medicines to help lower cholesterol. This may be a medicine to lower the amount of cholesterol that your liver makes. You may need medicine if: Diet and lifestyle changes have not lowered your cholesterol enough. You have high cholesterol and other risk factors for heart disease or stroke. Take over-the-counter and prescription medicines only as told by your health care provider. General information Manage your risk factors for high cholesterol. Talk with your health care provider about all your risk factors and how to lower your risk. Manage other conditions that you have, such as diabetes or high blood pressure (hypertension). Have blood tests to check your cholesterol levels at regular points in time as told by your health care provider. Keep all follow-up visits. This is important. Where to find more information American Heart Association: www.heart.org National Heart, Lung, and Blood Institute: www.nhlbi.nih.gov Summary High cholesterol  increases your risk for heart disease and stroke. By keeping your cholesterol level low, you can reduce your risk for these conditions. High cholesterol can often be prevented with diet and lifestyle changes. Work with your health care provider to manage your risk factors, and have your blood tested regularly. This information is not intended to replace advice given to you by your health care provider. Make sure you discuss any questions you have with your health care provider. Document Revised: 01/13/2022 Document Reviewed: 08/16/2020 Elsevier Patient Education  2024 Elsevier Inc.  

## 2023-03-27 ENCOUNTER — Encounter: Payer: Self-pay | Admitting: Nurse Practitioner

## 2023-03-27 ENCOUNTER — Ambulatory Visit (INDEPENDENT_AMBULATORY_CARE_PROVIDER_SITE_OTHER): Payer: No Typology Code available for payment source | Admitting: Nurse Practitioner

## 2023-03-27 VITALS — BP 126/86 | HR 76 | Temp 97.9°F | Wt 168.6 lb

## 2023-03-27 DIAGNOSIS — E785 Hyperlipidemia, unspecified: Secondary | ICD-10-CM

## 2023-03-27 DIAGNOSIS — R21 Rash and other nonspecific skin eruption: Secondary | ICD-10-CM

## 2023-03-27 DIAGNOSIS — I6523 Occlusion and stenosis of bilateral carotid arteries: Secondary | ICD-10-CM | POA: Diagnosis not present

## 2023-03-27 MED ORDER — PRALUENT 75 MG/ML ~~LOC~~ SOAJ: 75.0000 mg | SUBCUTANEOUS | 12 refills | Status: DC

## 2023-03-27 NOTE — Assessment & Plan Note (Signed)
Chronic, ongoing.  Is tolerating Praluent and Zetia.  Will check lipid panel and CMP today.  Adjust regimen as needed.

## 2023-03-27 NOTE — Progress Notes (Signed)
BP 126/86   Pulse 76   Temp 97.9 F (36.6 C) (Oral)   Wt 168 lb 9.6 oz (76.5 kg)   SpO2 98%   BMI 30.83 kg/m    Subjective:    Patient ID: Allison Pham, female    DOB: [birth year], 62 y.o.   MRN: [id number]  HPI: Allison Pham is a 62 y.o. female  Chief Complaint  Patient presents with   Rash    Believes it could be poison ivy but its clearing up on left arm   Hyperlipidemia   Has poison oak rash to left upper arm, is overall clearing.    HYPERLIPIDEMIA Started on Praluent weeks back and tolerating well.  Is taking Zetia. Stopped Pravastatin.  Did not tolerate statins well in past and Pravastatin not lowering levels. Hyperlipidemia status: good compliance Satisfied with current treatment?  yes Side effects:  no Medication compliance: good compliance Past cholesterol meds: multiple statins Supplements: none Aspirin:  yes The 10-year ASCVD risk score (Arnett DK, et al., 2019) is: 8.1%   Values used to calculate the score:     Age: 38 years     Sex: Female     Is Non-Hispanic African American: No     Diabetic: No     Tobacco smoker: No     Systolic Blood Pressure: 126 mmHg     Is BP treated: Yes     HDL Cholesterol: 39 mg/dL     Total Cholesterol: 256 mg/dL Chest pain:  no Coronary artery disease:  yes Family history CAD:  yes Family history early CAD:  no  Relevant past medical, surgical, family and social history reviewed and updated as indicated. Interim medical history since our last visit reviewed. Allergies and medications reviewed and updated.  Review of Systems  Constitutional:  Negative for activity change, appetite change, diaphoresis, fatigue and fever.  Respiratory:  Negative for cough, chest tightness and shortness of breath.   Cardiovascular:  Negative for chest pain, palpitations and leg swelling.  Gastrointestinal: Negative.   Skin:  Positive for rash.  Neurological: Negative.   Psychiatric/Behavioral: Negative.      Per HPI unless  specifically indicated above     Objective:    BP 126/86   Pulse 76   Temp 97.9 F (36.6 C) (Oral)   Wt 168 lb 9.6 oz (76.5 kg)   SpO2 98%   BMI 30.83 kg/m   Wt Readings from Last 3 Encounters:  03/27/23 168 lb 9.6 oz (76.5 kg)  12/19/22 166 lb 6.4 oz (75.5 kg)  10/23/22 172 lb 9.6 oz (78.3 kg)    Physical Exam Vitals and nursing note reviewed.  Constitutional:      General: She is awake. She is not in acute distress.    Appearance: She is well-developed and well-groomed. She is obese. She is not ill-appearing or toxic-appearing.  HENT:     Head: Normocephalic.     Right Ear: Hearing and external ear normal.     Left Ear: Hearing and external ear normal.  Eyes:     General: Lids are normal.        Right eye: No discharge.        Left eye: No discharge.     Conjunctiva/sclera: Conjunctivae normal.     Pupils: Pupils are equal, round, and reactive to light.  Neck:     Thyroid: No thyromegaly.     Vascular: No carotid bruit.  Cardiovascular:     Rate and Rhythm: Normal  rate and regular rhythm.     Heart sounds: Normal heart sounds. No murmur heard.    No gallop.  Pulmonary:     Effort: Pulmonary effort is normal. No accessory muscle usage or respiratory distress.     Breath sounds: Normal breath sounds.  Abdominal:     General: Bowel sounds are normal. There is no distension.     Palpations: Abdomen is soft.     Tenderness: There is no abdominal tenderness.  Musculoskeletal:     Cervical back: Normal range of motion and neck supple.     Right lower leg: No edema.     Left lower leg: No edema.  Lymphadenopathy:     Cervical: No cervical adenopathy.  Skin:    General: Skin is warm and dry.     Findings: Rash present.       Neurological:     Mental Status: She is alert and oriented to person, place, and time.     Deep Tendon Reflexes: Reflexes are normal and symmetric.     Reflex Scores:      Brachioradialis reflexes are 2+ on the right side and 2+ on the left  side.      Patellar reflexes are 2+ on the right side and 2+ on the left side. Psychiatric:        Attention and Perception: Attention normal.        Mood and Affect: Mood normal.        Speech: Speech normal.        Behavior: Behavior normal. Behavior is cooperative.        Thought Content: Thought content normal.    Results for orders placed or performed in visit on 12/19/22  CBC with Differential/Platelet  Result Value Ref Range   WBC 9.4 3.4 - 10.8 x10E3/uL   RBC 5.63 (H) 3.77 - 5.28 x10E6/uL   Hemoglobin 16.5 (H) 11.1 - 15.9 g/dL   Hematocrit 16.1 (H) 09.6 - 46.6 %   MCV 87 79 - 97 fL   MCH 29.3 26.6 - 33.0 pg   MCHC 33.9 31.5 - 35.7 g/dL   RDW 04.5 40.9 - 81.1 %   Platelets 285 150 - 450 x10E3/uL   Neutrophils 44 Not Estab. %   Lymphs 45 Not Estab. %   Monocytes 8 Not Estab. %   Eos 2 Not Estab. %   Basos 1 Not Estab. %   Neutrophils Absolute 4.2 1.4 - 7.0 x10E3/uL   Lymphocytes Absolute 4.2 (H) 0.7 - 3.1 x10E3/uL   Monocytes Absolute 0.7 0.1 - 0.9 x10E3/uL   EOS (ABSOLUTE) 0.2 0.0 - 0.4 x10E3/uL   Basophils Absolute 0.1 0.0 - 0.2 x10E3/uL   Immature Granulocytes 0 Not Estab. %   Immature Grans (Abs) 0.0 0.0 - 0.1 x10E3/uL  Comprehensive metabolic panel  Result Value Ref Range   Glucose 101 (H) 70 - 99 mg/dL   BUN 14 8 - 27 mg/dL   Creatinine, Ser 9.14 0.57 - 1.00 mg/dL   eGFR 68 >78 GN/FAO/1.30   BUN/Creatinine Ratio 15 12 - 28   Sodium 142 134 - 144 mmol/L   Potassium 4.2 3.5 - 5.2 mmol/L   Chloride 106 96 - 106 mmol/L   CO2 20 20 - 29 mmol/L   Calcium 9.5 8.7 - 10.3 mg/dL   Total Protein 6.8 6.0 - 8.5 g/dL   Albumin 4.1 3.9 - 4.9 g/dL   Globulin, Total 2.7 1.5 - 4.5 g/dL   Bilirubin Total 0.5 0.0 -  1.2 mg/dL   Alkaline Phosphatase 108 44 - 121 IU/L   AST 21 0 - 40 IU/L   ALT 20 0 - 32 IU/L  Lipid Panel w/o Chol/HDL Ratio  Result Value Ref Range   Cholesterol, Total 256 (H) 100 - 199 mg/dL   Triglycerides 188 (H) 0 - 149 mg/dL   HDL 39 (L) >41 mg/dL    VLDL Cholesterol Cal 35 5 - 40 mg/dL   LDL Chol Calc (NIH) 660 (H) 0 - 99 mg/dL  VITAMIN D 25 Hydroxy (Vit-D Deficiency, Fractures)  Result Value Ref Range   Vit D, 25-Hydroxy 70.2 30.0 - 100.0 Pham/mL  Gamma GT  Result Value Ref Range   GGT 165 (H) 0 - 60 IU/L  C-reactive protein  Result Value Ref Range   CRP 3 0 - 10 mg/L  Sed Rate (ESR)  Result Value Ref Range   Sed Rate 43 (H) 0 - 40 mm/hr  TSH  Result Value Ref Range   TSH 4.260 0.450 - 4.500 uIU/mL      Assessment & Plan:   Problem List Items Addressed This Visit       Cardiovascular and Mediastinum   Carotid atherosclerosis, bilateral    Ongoing and stable.  Is tolerating Praluent and Zetia.  Will check lipid panel and CMP today.  Adjust regimen as needed.  Plan on repeat doppler in new year.      Relevant Medications   Alirocumab (PRALUENT) 75 MG/ML SOAJ     Other   Dyslipidemia - Primary (Chronic)    Chronic, ongoing.  Is tolerating Praluent and Zetia.  Will check lipid panel and CMP today.  Adjust regimen as needed.      Relevant Medications   Alirocumab (PRALUENT) 75 MG/ML SOAJ   Other Relevant Orders   Lipid Panel w/o Chol/HDL Ratio   Comprehensive metabolic panel   Other Visit Diagnoses     Rash       Healing at this time, if any worsening to return to office.        Follow up plan: Return for as scheduled December 27th.

## 2023-03-27 NOTE — Assessment & Plan Note (Signed)
Ongoing and stable.  Is tolerating Praluent and Zetia.  Will check lipid panel and CMP today.  Adjust regimen as needed.  Plan on repeat doppler in new year.

## 2023-03-28 LAB — LIPID PANEL W/O CHOL/HDL RATIO
Cholesterol, Total: 201 mg/dL — ABNORMAL HIGH (ref 100–199)
HDL: 42 mg/dL (ref >39)
LDL Chol Calc (NIH): 130 mg/dL — ABNORMAL HIGH (ref 0–99)
Triglycerides: 161 mg/dL — ABNORMAL HIGH (ref 0–149)
VLDL Cholesterol Cal: 29 mg/dL (ref 5–40)

## 2023-03-28 LAB — COMPREHENSIVE METABOLIC PANEL
ALT: 26 [IU]/L (ref 0–32)
AST: 19 [IU]/L (ref 0–40)
Albumin: 4.4 g/dL (ref 3.9–4.9)
Alkaline Phosphatase: 118 [IU]/L (ref 44–121)
BUN/Creatinine Ratio: 18 (ref 12–28)
BUN: 17 mg/dL (ref 8–27)
Bilirubin Total: 0.5 mg/dL (ref 0.0–1.2)
CO2: 24 mmol/L (ref 20–29)
Calcium: 9.9 mg/dL (ref 8.7–10.3)
Chloride: 106 mmol/L (ref 96–106)
Creatinine, Ser: 0.92 mg/dL (ref 0.57–1.00)
Globulin, Total: 2.3 g/dL (ref 1.5–4.5)
Glucose: 88 mg/dL (ref 70–99)
Potassium: 4.3 mmol/L (ref 3.5–5.2)
Sodium: 144 mmol/L (ref 134–144)
Total Protein: 6.7 g/dL (ref 6.0–8.5)
eGFR: 70 mL/min/{1.73_m2} (ref >59)

## 2023-03-28 NOTE — Progress Notes (Signed)
Contacted via MyChart   Good morning Dea, your labs have returned.  Lipid panel show LDL trending down, not at goal but trending down.  Keep taking Praluent and lets give it a little more time and see how levels do.  Remainder of labs look great.  Any questions? Keep being amazing!!  Thank you for allowing me to participate in your care.  I appreciate you. Kindest regards, Phyliss Hulick

## 2023-05-11 ENCOUNTER — Other Ambulatory Visit: Payer: Self-pay | Admitting: Nurse Practitioner

## 2023-05-14 NOTE — Telephone Encounter (Signed)
Last RF 07/05/22 #90 4 RF    Requested Prescriptions  Refused Prescriptions Disp Refills   amLODipine (NORVASC) 5 MG tablet [Pharmacy Med Name: AMLODIPINE BESYLATE 5 MG TAB] 90 tablet 0    Sig: Take 2 tablets (10 mg total) by mouth daily.     Cardiovascular: Calcium Channel Blockers 2 Passed - 05/11/2023  1:14 PM      Passed - Last BP in normal range    BP Readings from Last 1 Encounters:  03/27/23 126/86         Passed - Last Heart Rate in normal range    Pulse Readings from Last 1 Encounters:  03/27/23 76         Passed - Valid encounter within last 6 months    Recent Outpatient Visits           1 month ago Dyslipidemia   [facility] [place], Corrie Dandy T, NP   4 months ago Medicare annual wellness visit, initial   [place] [facility] [place], [place] T, NP   6 months ago Rash   [facility] [place], [place] T, NP   10 months ago Irritant contact dermatitis due to plants, except food   [facility] [place], Corrie Dandy T, NP   10 months ago Localized maculopapular rash   [place] [facility] [place], Oswaldo Conroy, PA-C       Future Appointments             In 1 month [place], Dorie Rank, NP [place] [employer] [facility], [facility]

## 2023-06-18 ENCOUNTER — Ambulatory Visit: Payer: No Typology Code available for payment source

## 2023-06-19 NOTE — Patient Instructions (Addendum)
Start taking Metamucil gummies as written on bottle daily.  If needed take Senna S.    Be Involved in Caring For Your Health:  Taking Medications When medications are taken as directed, they can greatly improve your health. But if they are not taken as prescribed, they may not work. In some cases, not taking them correctly can be harmful. To help ensure your treatment remains effective and safe, understand your medications and how to take them. Bring your medications to each visit for review by your provider.  Your lab results, notes, and after visit summary will be available on My Chart. We strongly encourage you to use this feature. If lab results are abnormal the clinic will contact you with the appropriate steps. If the clinic does not contact you assume the results are satisfactory. You can always view your results on My Chart. If you have questions regarding your health or results, please contact the clinic during office hours. You can also ask questions on My Chart.  We at [facility] are grateful that you chose [place] to provide your care. We strive to provide evidence-based and compassionate care and are always looking for feedback. If you get a survey from the clinic please complete this so we can hear your opinions.  DASH Eating Plan DASH stands for Dietary Approaches to Stop Hypertension. The DASH eating plan is a healthy eating plan that has been shown to: Lower high blood pressure (hypertension). Reduce your risk for type 2 diabetes, heart disease, and stroke. Help with weight loss. What are tips for following this plan? Reading food labels Check food labels for the amount of salt (sodium) per serving. Choose foods with less than 5 percent of the Daily Value (DV) of sodium. In general, foods with less than 300 milligrams (mg) of sodium per serving fit into this eating plan. To find whole grains, look for the word "whole" as the first word in the ingredient  list. Shopping Buy products labeled as "low-sodium" or "no salt added." Buy fresh foods. Avoid canned foods and pre-made or frozen meals. Cooking Try not to add salt when you cook. Use salt-free seasonings or herbs instead of table salt or sea salt. Check with your health care provider or pharmacist before using salt substitutes. Do not fry foods. Cook foods in healthy ways, such as baking, boiling, grilling, roasting, or broiling. Cook using oils that are good for your heart. These include olive, canola, avocado, soybean, and sunflower oil. Meal planning  Eat a balanced diet. This should include: 4 or more servings of fruits and 4 or more servings of vegetables each day. Try to fill half of your plate with fruits and vegetables. 6-8 servings of whole grains each day. 6 or less servings of lean meat, poultry, or fish each day. 1 oz is 1 serving. A 3 oz (85 g) serving of meat is about the same size as the palm of your hand. One egg is 1 oz (28 g). 2-3 servings of low-fat dairy each day. One serving is 1 cup (237 mL). 1 serving of nuts, seeds, or beans 5 times each week. 2-3 servings of heart-healthy fats. Healthy fats called omega-3 fatty acids are found in foods such as walnuts, flaxseeds, fortified milks, and eggs. These fats are also found in cold-water fish, such as sardines, salmon, and mackerel. Limit how much you eat of: Canned or prepackaged foods. Food that is high in trans fat, such as fried foods. Food that is high in saturated  fat, such as fatty meat. Desserts and other sweets, sugary drinks, and other foods with added sugar. Full-fat dairy products. Do not salt foods before eating. Do not eat more than 4 egg yolks a week. Try to eat at least 2 vegetarian meals a week. Eat more home-cooked food and less restaurant, buffet, and fast food. Lifestyle When eating at a restaurant, ask if your food can be made with less salt or no salt. If you drink alcohol: Limit how much you have  to: 0-1 drink a day if you are female. 0-2 drinks a day if you are female. Know how much alcohol is in your drink. In the [place] one drink is one 12 oz bottle of beer (355 mL), one 5 oz glass of wine (148 mL), or one 1 oz glass of hard liquor (44 mL). General information Avoid eating more than 2,300 mg of salt a day. If you have hypertension, you may need to reduce your sodium intake to 1,500 mg a day. Work with your provider to stay at a healthy body weight or lose weight. Ask what the best weight range is for you. On most days of the week, get at least 30 minutes of exercise that causes your heart to beat faster. This may include walking, swimming, or biking. Work with your provider or dietitian to adjust your eating plan to meet your specific calorie needs. What foods should I eat? Fruits All fresh, dried, or frozen fruit. Canned fruits that are in their natural juice and do not have sugar added to them. Vegetables Fresh or frozen vegetables that are raw, steamed, roasted, or grilled. Low-sodium or reduced-sodium tomato and vegetable juice. Low-sodium or reduced-sodium tomato sauce and tomato paste. Low-sodium or reduced-sodium canned vegetables. Grains Whole-grain or whole-wheat bread. Whole-grain or whole-wheat pasta. Brown rice. Orpah Cobb. Bulgur. Whole-grain and low-sodium cereals. Pita bread. Low-fat, low-sodium crackers. Whole-wheat flour tortillas. Meats and other proteins Skinless chicken or [place]. Ground chicken or [place]. Pork with fat trimmed off. Fish and seafood. Egg whites. Dried beans, peas, or lentils. Unsalted nuts, nut butters, and seeds. Unsalted canned beans. Lean cuts of beef with fat trimmed off. Low-sodium, lean precooked or cured meat, such as sausages or meat loaves. Dairy Low-fat (1%) or fat-free (skim) milk. Reduced-fat, low-fat, or fat-free cheeses. Nonfat, low-sodium ricotta or cottage cheese. Low-fat or nonfat yogurt. Low-fat, low-sodium cheese. Fats and  oils Soft margarine without trans fats. Vegetable oil. Reduced-fat, low-fat, or light mayonnaise and salad dressings (reduced-sodium). Canola, safflower, olive, avocado, soybean, and sunflower oils. Avocado. Seasonings and condiments Herbs. Spices. Seasoning mixes without salt. Other foods Unsalted popcorn and pretzels. Fat-free sweets. The items listed above may not be all the foods and drinks you can have. Talk to a dietitian to learn more. What foods should I avoid? Fruits Canned fruit in a light or heavy syrup. Fried fruit. Fruit in cream or butter sauce. Vegetables Creamed or fried vegetables. Vegetables in a cheese sauce. Regular canned vegetables that are not marked as low-sodium or reduced-sodium. Regular canned tomato sauce and paste that are not marked as low-sodium or reduced-sodium. Regular tomato and vegetable juices that are not marked as low-sodium or reduced-sodium. Rosita Fire. Olives. Grains Baked goods made with fat, such as croissants, muffins, or some breads. Dry pasta or rice meal packs. Meats and other proteins Fatty cuts of meat. Ribs. Fried meat. Tomasa Blase. Bologna, salami, and other precooked or cured meats, such as sausages or meat loaves, that are not lean and low in sodium. Fat  from the back of a pig (fatback). Bratwurst. Salted nuts and seeds. Canned beans with added salt. Canned or smoked fish. Whole eggs or egg yolks. Chicken or [place] with skin. Dairy Whole or 2% milk, cream, and half-and-half. Whole or full-fat cream cheese. Whole-fat or sweetened yogurt. Full-fat cheese. Nondairy creamers. Whipped toppings. Processed cheese and cheese spreads. Fats and oils Butter. Stick margarine. Lard. Shortening. Ghee. Bacon fat. Tropical oils, such as coconut, palm kernel, or palm oil. Seasonings and condiments Onion salt, garlic salt, seasoned salt, table salt, and sea salt. Worcestershire sauce. Tartar sauce. Barbecue sauce. Teriyaki sauce. Soy sauce, including reduced-sodium soy  sauce. Steak sauce. Canned and packaged gravies. Fish sauce. Oyster sauce. Cocktail sauce. Store-bought horseradish. Ketchup. Mustard. Meat flavorings and tenderizers. Bouillon cubes. Hot sauces. Pre-made or packaged marinades. Pre-made or packaged taco seasonings. Relishes. Regular salad dressings. Other foods Salted popcorn and pretzels. The items listed above may not be all the foods and drinks you should avoid. Talk to a dietitian to learn more. Where to find more information National Heart, Lung, and Blood Institute (NHLBI): BuffaloDryCleaner.gl American Heart Association (AHA): heart.org Academy of Nutrition and Dietetics: eatright.org National Kidney Foundation (NKF): kidney.org This information is not intended to replace advice given to you by your health care provider. Make sure you discuss any questions you have with your health care provider. Document Revised: 06/29/2022 Document Reviewed: 06/29/2022 Elsevier Patient Education  2024 [employer].

## 2023-06-22 ENCOUNTER — Ambulatory Visit (INDEPENDENT_AMBULATORY_CARE_PROVIDER_SITE_OTHER): Payer: No Typology Code available for payment source | Admitting: Nurse Practitioner

## 2023-06-22 ENCOUNTER — Encounter: Payer: Self-pay | Admitting: Nurse Practitioner

## 2023-06-22 VITALS — BP 123/84 | HR 81 | Temp 97.6°F | Ht 62.0 in | Wt 168.6 lb

## 2023-06-22 DIAGNOSIS — N952 Postmenopausal atrophic vaginitis: Secondary | ICD-10-CM | POA: Diagnosis not present

## 2023-06-22 DIAGNOSIS — I6523 Occlusion and stenosis of bilateral carotid arteries: Secondary | ICD-10-CM | POA: Diagnosis not present

## 2023-06-22 DIAGNOSIS — F320 Major depressive disorder, single episode, mild: Secondary | ICD-10-CM

## 2023-06-22 DIAGNOSIS — J432 Centrilobular emphysema: Secondary | ICD-10-CM | POA: Diagnosis not present

## 2023-06-22 DIAGNOSIS — D582 Other hemoglobinopathies: Secondary | ICD-10-CM | POA: Diagnosis not present

## 2023-06-22 DIAGNOSIS — F411 Generalized anxiety disorder: Secondary | ICD-10-CM | POA: Diagnosis not present

## 2023-06-22 DIAGNOSIS — E785 Hyperlipidemia, unspecified: Secondary | ICD-10-CM

## 2023-06-22 DIAGNOSIS — I10 Essential (primary) hypertension: Secondary | ICD-10-CM

## 2023-06-22 DIAGNOSIS — K5901 Slow transit constipation: Secondary | ICD-10-CM | POA: Diagnosis not present

## 2023-06-22 DIAGNOSIS — K5900 Constipation, unspecified: Secondary | ICD-10-CM | POA: Insufficient documentation

## 2023-06-22 DIAGNOSIS — E66811 Obesity, class 1: Secondary | ICD-10-CM

## 2023-06-22 DIAGNOSIS — Z87891 Personal history of nicotine dependence: Secondary | ICD-10-CM | POA: Diagnosis not present

## 2023-06-22 MED ORDER — ESTRADIOL 0.1 MG/GM VA CREA: TOPICAL_CREAM | VAGINAL | 12 refills | Status: AC

## 2023-06-22 NOTE — Assessment & Plan Note (Signed)
Ongoing issue, will return to Estradiol vaginal cream use for period.  She is aware this is not for long term use.  Recommend using KY jelly during intercourse.

## 2023-06-22 NOTE — Progress Notes (Signed)
BP 123/84   Pulse 81   Temp 97.6 F (36.4 C) (Oral)   Ht 5\' 2"  (1.575 m)   Wt 168 lb 9.6 oz (76.5 kg)   SpO2 98%   BMI 30.84 kg/m    Subjective:    Patient ID: Allison Pham, female    DOB: 1961-05-02, 62 y.o.   MRN: [id number]  HPI: Allison Pham is a 62 y.o. female  Chief Complaint  Patient presents with   Anxiety   COPD   Hyperlipidemia   Hypertension   Is having vaginal dryness, would like to discuss options (used Estradiol) + would like to discuss options for constipation.  HYPERTENSION/HLD Continues on Amlodipine 10 MG and Losartan 100 MG daily. Currently not taking Praluent, stopped taking in November due to stomach pain with this and increased BM + nausea and could not eat.  Continues on Zetia.  Previously took Pravastatin, but could not get to goal.  Also took Atorvastatin and Rosuvastatin caused side effects.   Satisfied with current treatment? yes Duration of hypertension: chronic BP monitoring frequency: daily BP range: on average <130/80 BP medication side effects: no Past BP meds: multiple kinds Duration of hyperlipidemia: chronic Cholesterol medication side effects: no Cholesterol supplements: none Past cholesterol medications: multiple different kinds Medication compliance: good compliance Aspirin: no Recent stressors: no Recurrent headaches: no Visual changes: no Palpitations: no Dyspnea: no Chest pain: no Lower extremity edema: no Dizzy/lightheaded: no  The 10-year ASCVD risk score (Arnett DK, et al., 2019) is: 6.4%   Values used to calculate the score:     Age: 15 years     Sex: Female     Is Non-Hispanic African American: No     Diabetic: No     Tobacco smoker: No     Systolic Blood Pressure: 123 mmHg     Is BP treated: Yes     HDL Cholesterol: 42 mg/dL     Total Cholesterol: 201 mg/dL  COPD She is a past smoker -- off and on, was a 1 PPD smoker in past. Quit >5 years ago.  No current symptoms or inhalers.  There are smokers in her  household. Centrilobular mild noted on lung CT 2019, has not gone for return screening, has been ordered. COPD status: stable Satisfied with current treatment?: yes Oxygen use: no Dyspnea frequency:  none Cough frequency: none Limitation of activity: no Productive cough: no Last Spirometry: unknown Pneumovax:  refuses Influenza:  refuses  ANXIETY/DEPRESSION Taking Buspar, Gabapentin, and Trazodone.   Mood status: stable Satisfied with current treatment?: yes Symptom severity: moderate  Duration of current treatment : chronic Side effects: no Medication compliance: good compliance Psychotherapy/counseling: none Depressed mood: no Anxious mood: occasional Anhedonia: occasional Significant weight loss or gain: no Insomnia: none Fatigue: no Feelings of worthlessness or guilt: no Impaired concentration/indecisiveness: no Suicidal ideations: no Hopelessness: no Crying spells: no    06/22/2023   10:59 AM 03/27/2023   11:21 AM 12/19/2022   11:22 AM 10/23/2022    3:00 PM 05/03/2022   10:20 AM  Depression screen PHQ 2/9  Decreased Interest 1 2 1 2 1   Down, Depressed, Hopeless 2 1 1 1 1   PHQ - 2 Score 3 3 2 3 2   Altered sleeping 1 2 1 1 1   Tired, decreased energy 2 2 1 2 2   Change in appetite 1 1 0 1 1  Feeling bad or failure about yourself  1 1 1 2 1   Trouble concentrating 1 2 0  1 2  Moving slowly or fidgety/restless 0 1 0 0 0  Suicidal thoughts 0 0 0 0 0  PHQ-9 Score 9 12 5 10 9   Difficult doing work/chores Somewhat difficult Not difficult at all Not difficult at all  Not difficult at all       06/22/2023   10:59 AM 03/27/2023   11:22 AM 12/19/2022   11:22 AM 10/23/2022    3:00 PM  GAD 7 : Generalized Anxiety Score  Nervous, Anxious, on Edge 1 2 1 1   Control/stop worrying 1 1 0 1  Worry too much - different things 1 1 1 2   Trouble relaxing 2 1 1 2   Restless 1 1 0 1  Easily annoyed or irritable 1 1 1 1   Afraid - awful might happen 1 1 0 1  Total GAD 7 Score 8 8 4 9    Anxiety Difficulty Somewhat difficult Somewhat difficult Not difficult at all    Relevant past medical, surgical, family and social history reviewed and updated as indicated. Interim medical history since our last visit reviewed. Allergies and medications reviewed and updated.  Review of Systems  Constitutional:  Negative for activity change, appetite change, diaphoresis, fatigue and fever.  Respiratory:  Negative for cough, chest tightness, shortness of breath and wheezing.   Cardiovascular:  Negative for chest pain, palpitations and leg swelling.  Gastrointestinal:  Positive for constipation. Negative for abdominal distention, abdominal pain, diarrhea, nausea and vomiting.  Neurological: Negative.   Psychiatric/Behavioral: Negative.      Per HPI unless specifically indicated above     Objective:    BP 123/84   Pulse 81   Temp 97.6 F (36.4 C) (Oral)   Ht 5\' 2"  (1.575 m)   Wt 168 lb 9.6 oz (76.5 kg)   SpO2 98%   BMI 30.84 kg/m   Wt Readings from Last 3 Encounters:  06/22/23 168 lb 9.6 oz (76.5 kg)  03/27/23 168 lb 9.6 oz (76.5 kg)  12/19/22 166 lb 6.4 oz (75.5 kg)    Physical Exam Vitals and nursing note reviewed.  Constitutional:      General: She is awake. She is not in acute distress.    Appearance: Normal appearance. She is well-developed and well-groomed. She is obese. She is not ill-appearing or toxic-appearing.  HENT:     Head: Normocephalic.     Right Ear: Hearing and external ear normal.     Left Ear: Hearing and external ear normal.  Eyes:     General: Lids are normal.        Right eye: No discharge.        Left eye: No discharge.     Conjunctiva/sclera: Conjunctivae normal.     Pupils: Pupils are equal, round, and reactive to light.  Neck:     Thyroid: No thyromegaly.     Vascular: No carotid bruit.  Cardiovascular:     Rate and Rhythm: Normal rate and regular rhythm.     Heart sounds: Normal heart sounds. No murmur heard.    No gallop.  Pulmonary:      Effort: Pulmonary effort is normal. No accessory muscle usage or respiratory distress.     Breath sounds: Normal breath sounds.  Abdominal:     General: Bowel sounds are normal. There is no distension.     Palpations: Abdomen is soft.     Tenderness: There is no abdominal tenderness.  Musculoskeletal:     Cervical back: Normal range of motion and neck supple.  Right lower leg: No edema.     Left lower leg: No edema.  Lymphadenopathy:     Cervical: No cervical adenopathy.  Skin:    General: Skin is warm and dry.  Neurological:     Mental Status: She is alert and oriented to person, place, and time.     Deep Tendon Reflexes: Reflexes are normal and symmetric.     Reflex Scores:      Brachioradialis reflexes are 2+ on the right side and 2+ on the left side.      Patellar reflexes are 2+ on the right side and 2+ on the left side. Psychiatric:        Attention and Perception: Attention normal.        Mood and Affect: Mood normal.        Speech: Speech normal.        Behavior: Behavior normal. Behavior is cooperative.        Thought Content: Thought content normal.    Results for orders placed or performed in visit on 03/27/23  Lipid Panel w/o Chol/HDL Ratio   Collection Time: 03/27/23 11:43 AM  Result Value Ref Range   Cholesterol, Total 201 (H) 100 - 199 mg/dL   Triglycerides 161 (H) 0 - 149 mg/dL   HDL 42 >09 mg/dL   VLDL Cholesterol Cal 29 5 - 40 mg/dL   LDL Chol Calc (NIH) 604 (H) 0 - 99 mg/dL  Comprehensive metabolic panel   Collection Time: 03/27/23 11:43 AM  Result Value Ref Range   Glucose 88 70 - 99 mg/dL   BUN 17 8 - 27 mg/dL   Creatinine, Ser 5.40 0.57 - 1.00 mg/dL   eGFR 70 >98 JX/BJY/7.82   BUN/Creatinine Ratio 18 12 - 28   Sodium 144 134 - 144 mmol/L   Potassium 4.3 3.5 - 5.2 mmol/L   Chloride 106 96 - 106 mmol/L   CO2 24 20 - 29 mmol/L   Calcium 9.9 8.7 - 10.3 mg/dL   Total Protein 6.7 6.0 - 8.5 g/dL   Albumin 4.4 3.9 - 4.9 g/dL   Globulin, Total  2.3 1.5 - 4.5 g/dL   Bilirubin Total 0.5 0.0 - 1.2 mg/dL   Alkaline Phosphatase 118 44 - 121 IU/L   AST 19 0 - 40 IU/L   ALT 26 0 - 32 IU/L      Assessment & Plan:   Problem List Items Addressed This Visit       Cardiovascular and Mediastinum   Essential hypertension, benign (Chronic)   Chronic, stable.  BP at goal in office and on home checks.  Recommend she monitor BP at least a few mornings a week at home and document.  DASH diet at home.  Continue current medication regimen and adjust as needed, may be able to reduce Amlodipine back down in future if BP remains stable.  Labs: CMP.  Refills up to date.  Return in 6 months.      Carotid atherosclerosis, bilateral   Ongoing and stable.  Is tolerating Zetia, but did not tolerate Praluent and stopped taking.  Will check lipid panel and CMP today.  Adjust regimen as needed.  Plan on repeat doppler in new year. May need to send to lipid clinic.        Respiratory   Centrilobular emphysema (HCC) - Primary (Chronic)   Chronic, stable with no inhalers.  Will plan on spirometry next visit.  If worsening symptoms initiate inhaler regimen.  Continue yearly lung screening --  recommend she call to schedule this and provided number.          Genitourinary   Atrophic vaginitis   Ongoing issue, will return to Estradiol vaginal cream use for period.  She is aware this is not for long term use.  Recommend using KY jelly during intercourse.        Other   Depression, major, single episode, mild (HCC) (Chronic)   Chronic, ongoing.  Denies SI/HI.  Reports benefit from current regimen, will continue this and send refills as needed.  Continue therapy visits as needed.  Return in 6 months.      Dyslipidemia (Chronic)   Chronic, ongoing.  Is tolerating Zetia, but did not tolerate Praluent and stopped taking.  Will check lipid panel and CMP today.  Adjust regimen as needed. May need to send to lipid clinic if ongoing elevations.      Relevant  Orders   Comprehensive metabolic panel   Lipid Panel w/o Chol/HDL Ratio   Generalized anxiety disorder (Chronic)   Refer to depression plan of care.      Obesity (BMI 30.0-34.9) (Chronic)   BMI 30.84. Recommended eating smaller high protein, low fat meals more frequently and exercising 30 mins a day 5 times a week with a goal of 10-15lb weight loss in the next 3 months. Patient voiced their understanding and motivation to adhere to these recommendations.          Constipation   Chronic, occasional bouts of this.  Recommend she start Metamucil gummies or powder daily to help with fiber intake, this may benefit cholesterol level as well.  Can take Senna S as needed for episodes of constipation not resolved with Metamucil.  Recommend she ensure increased water intake at home and fiber.      Elevated hemoglobin (HCC)   Ongoing, is a past smoker. Recheck today and if continued elevations may send to hematology for further assessment.      Relevant Orders   CBC with Differential/Platelet   Smoking history   Recommend she continue yearly lung screening and to schedule this, provided pamphlet with number to call and schedule.  Referral is in place.        Follow up plan: Return in about 6 months (around 12/21/2023) for HTN/HLD, MOOD, COPD.

## 2023-06-22 NOTE — Assessment & Plan Note (Signed)
Refer to depression plan of care. 

## 2023-06-22 NOTE — Assessment & Plan Note (Signed)
Chronic, stable.  BP at goal in office and on home checks.  Recommend she monitor BP at least a few mornings a week at home and document.  DASH diet at home.  Continue current medication regimen and adjust as needed, may be able to reduce Amlodipine back down in future if BP remains stable.  Labs: CMP.  Refills up to date.  Return in 6 months.

## 2023-06-22 NOTE — Assessment & Plan Note (Signed)
Ongoing and stable.  Is tolerating Zetia, but did not tolerate Praluent and stopped taking.  Will check lipid panel and CMP today.  Adjust regimen as needed.  Plan on repeat doppler in new year. May need to send to lipid clinic.

## 2023-06-22 NOTE — Assessment & Plan Note (Signed)
Chronic, occasional bouts of this.  Recommend she start Metamucil gummies or powder daily to help with fiber intake, this may benefit cholesterol level as well.  Can take Senna S as needed for episodes of constipation not resolved with Metamucil.  Recommend she ensure increased water intake at home and fiber.

## 2023-06-22 NOTE — Assessment & Plan Note (Signed)
Chronic, stable with no inhalers.  Will plan on spirometry next visit.  If worsening symptoms initiate inhaler regimen.  Continue yearly lung screening -- recommend she call to schedule this and provided number.

## 2023-06-22 NOTE — Assessment & Plan Note (Signed)
Chronic, ongoing.  Denies SI/HI.  Reports benefit from current regimen, will continue this and send refills as needed.  Continue therapy visits as needed.  Return in 6 months. 

## 2023-06-22 NOTE — Assessment & Plan Note (Signed)
Ongoing, is a past smoker. Recheck today and if continued elevations may send to hematology for further assessment.

## 2023-06-22 NOTE — Assessment & Plan Note (Signed)
 BMI 30.84.  Recommended eating smaller high protein, low fat meals more frequently and exercising 30 mins a day 5 times a week with a goal of 10-15lb weight loss in the next 3 months. Patient voiced their understanding and motivation to adhere to these recommendations.

## 2023-06-22 NOTE — Assessment & Plan Note (Signed)
Recommend she continue yearly lung screening and to schedule this, provided pamphlet with number to call and schedule.  Referral is in place.

## 2023-06-22 NOTE — Assessment & Plan Note (Signed)
Chronic, ongoing.  Is tolerating Zetia, but did not tolerate Praluent and stopped taking.  Will check lipid panel and CMP today.  Adjust regimen as needed. May need to send to lipid clinic if ongoing elevations.

## 2023-06-23 LAB — LIPID PANEL W/O CHOL/HDL RATIO
Cholesterol, Total: 228 mg/dL — ABNORMAL HIGH (ref 100–199)
HDL: 40 mg/dL (ref >39)
LDL Chol Calc (NIH): 151 mg/dL — ABNORMAL HIGH (ref 0–99)
Triglycerides: 202 mg/dL — ABNORMAL HIGH (ref 0–149)
VLDL Cholesterol Cal: 37 mg/dL (ref 5–40)

## 2023-06-23 LAB — CBC WITH DIFFERENTIAL/PLATELET
Basophils Absolute: 0.1 10*3/uL (ref 0.0–0.2)
Basos: 1 %
EOS (ABSOLUTE): 0.2 10*3/uL (ref 0.0–0.4)
Eos: 3 %
Hematocrit: 49.7 % — ABNORMAL HIGH (ref 34.0–46.6)
Hemoglobin: 16.4 g/dL — ABNORMAL HIGH (ref 11.1–15.9)
Immature Grans (Abs): 0 10*3/uL (ref 0.0–0.1)
Immature Granulocytes: 0 %
Lymphocytes Absolute: 3.8 10*3/uL — ABNORMAL HIGH (ref 0.7–3.1)
Lymphs: 48 %
MCH: 28.5 pg (ref 26.6–33.0)
MCHC: 33 g/dL (ref 31.5–35.7)
MCV: 86 fL (ref 79–97)
Monocytes Absolute: 0.7 10*3/uL (ref 0.1–0.9)
Monocytes: 9 %
Neutrophils Absolute: 3.1 10*3/uL (ref 1.4–7.0)
Neutrophils: 39 %
Platelets: 244 10*3/uL (ref 150–450)
RBC: 5.75 x10E6/uL — ABNORMAL HIGH (ref 3.77–5.28)
RDW: 13.5 % (ref 11.7–15.4)
WBC: 8 10*3/uL (ref 3.4–10.8)

## 2023-06-23 LAB — COMPREHENSIVE METABOLIC PANEL
ALT: 18 [IU]/L (ref 0–32)
AST: 19 [IU]/L (ref 0–40)
Albumin: 4 g/dL (ref 3.9–4.9)
Alkaline Phosphatase: 103 [IU]/L (ref 44–121)
BUN/Creatinine Ratio: 18 (ref 12–28)
BUN: 18 mg/dL (ref 8–27)
Bilirubin Total: 0.3 mg/dL (ref 0.0–1.2)
CO2: 21 mmol/L (ref 20–29)
Calcium: 9.9 mg/dL (ref 8.7–10.3)
Chloride: 108 mmol/L — ABNORMAL HIGH (ref 96–106)
Creatinine, Ser: 1 mg/dL (ref 0.57–1.00)
Globulin, Total: 2.4 g/dL (ref 1.5–4.5)
Glucose: 94 mg/dL (ref 70–99)
Potassium: 4.4 mmol/L (ref 3.5–5.2)
Sodium: 143 mmol/L (ref 134–144)
Total Protein: 6.4 g/dL (ref 6.0–8.5)
eGFR: 63 mL/min/{1.73_m2} (ref >59)

## 2023-06-23 NOTE — Addendum Note (Signed)
Addended by: Aura Dials T on: 06/23/2023 11:24 AM   Modules accepted: Orders

## 2023-06-23 NOTE — Progress Notes (Signed)
Contacted via MyChart   Good morning Allison Pham, I was pondering your labs more this morning: - CBC continues to show elevation in hemoglobin and hematocrit + lymphocytes.  At times we see this with smoking.  I would like to send you to hematology as I do not see where you have ever seen them for this.  They are the blood doctors and can assess this a little more.  I will put a referral in to them. - For your lipid panel I know you stopped Praluent.  Would you be okay with trying Rosuvastatin, another statin, daily with Zetia and if this does not work then we could send you to lipid clinic.  Any questions on this?

## 2023-06-24 ENCOUNTER — Encounter: Payer: Self-pay | Admitting: Nurse Practitioner

## 2023-06-24 DIAGNOSIS — E785 Hyperlipidemia, unspecified: Secondary | ICD-10-CM

## 2023-06-25 MED ORDER — ROSUVASTATIN CALCIUM 40 MG PO TABS: 40.0000 mg | ORAL_TABLET | Freq: Every day | ORAL | 4 refills | Status: DC

## 2023-06-25 NOTE — Addendum Note (Signed)
Addended by: Aura Dials T on: 06/25/2023 08:13 AM   Modules accepted: Orders

## 2023-06-25 NOTE — Telephone Encounter (Signed)
Attempted to reach patient, LVM to call office back to get scheduled for a 8 week lab only visit.  Will put in CRM.

## 2023-07-03 ENCOUNTER — Encounter: Payer: Self-pay | Admitting: Nurse Practitioner

## 2023-07-27 ENCOUNTER — Encounter: Payer: Self-pay | Admitting: Emergency Medicine

## 2023-09-18 ENCOUNTER — Other Ambulatory Visit: Payer: Self-pay | Admitting: Nurse Practitioner

## 2023-09-19 NOTE — Telephone Encounter (Signed)
 Requested Prescriptions  Pending Prescriptions Disp Refills   traZODone (DESYREL) 50 MG tablet [Pharmacy Med Name: TRAZODONE 50 MG TABLET] 90 tablet 0    Sig: Take 0.5 tablets (25 mg total) by mouth at bedtime.     Psychiatry: Antidepressants - Serotonin Modulator Failed - 09/19/2023  5:00 PM      Failed - Valid encounter within last 6 months    Recent Outpatient Visits   None     Future Appointments             In 3 months Cannady, Dorie Rank, NP [place] [facility]            Passed - Completed PHQ-2 or PHQ-9 in the last 360 days       busPIRone (BUSPAR) 5 MG tablet [Pharmacy Med Name: BUSPIRONE HCL 5 MG TABLET] 270 tablet 0    Sig: Take 0.5-1 tablets (2.5-5 mg total) by mouth 3 (three) times daily as needed.     Psychiatry: Anxiolytics/Hypnotics - Non-controlled Failed - 09/19/2023  5:00 PM      Failed - Valid encounter within last 12 months    Recent Outpatient Visits   None     Future Appointments             In 3 months Cannady, Dorie Rank, NP [place] [facility]

## 2023-10-16 ENCOUNTER — Other Ambulatory Visit: Payer: Self-pay

## 2023-10-16 MED ORDER — LOSARTAN POTASSIUM 100 MG PO TABS: 100.0000 mg | ORAL_TABLET | Freq: Every day | ORAL | 4 refills | Status: AC

## 2023-10-29 ENCOUNTER — Other Ambulatory Visit: Payer: Self-pay | Admitting: Nurse Practitioner

## 2023-10-31 NOTE — Telephone Encounter (Signed)
 Requested Prescriptions  Pending Prescriptions Disp Refills   ezetimibe  (ZETIA ) 10 MG tablet [Pharmacy Med Name: EZETIMIBE  10 MG TABLET] 90 tablet 0    Sig: Take 1 tablet (10 mg total) by mouth daily.     Cardiovascular:  Antilipid - Sterol Transport Inhibitors Failed - 10/31/2023 11:12 AM      Failed - Valid encounter within last 12 months    Recent Outpatient Visits   None     Future Appointments             In 1 month Cannady, Lavelle Posey, NP  [facility]            Failed - Lipid Panel in normal range within the last 12 months    Cholesterol, Total  Date Value Ref Range Status  06/22/2023 228 (H) 100 - 199 mg/dL Final   LDL Cholesterol (Calc)  Date Value Ref Range Status  07/02/2018 126 (H) mg/dL (calc) Final    Comment:    Reference range: <100 . Desirable range <100 mg/dL for primary prevention;   <70 mg/dL for patients with CHD or diabetic patients  with > or = 2 CHD risk factors. Aaron Aas LDL-C is now calculated using the Martin-Hopkins  calculation, which is a validated novel method providing  better accuracy than the Friedewald equation in the  estimation of LDL-C.  Melinda Sprawls et al. Erroll Heard. 0981;191(47): 2061-2068  (http://education.[employer].com/faq/FAQ164)    LDL Chol Calc (NIH)  Date Value Ref Range Status  06/22/2023 151 (H) 0 - 99 mg/dL Final   HDL  Date Value Ref Range Status  06/22/2023 40 >39 mg/dL Final   Triglycerides  Date Value Ref Range Status  06/22/2023 202 (H) 0 - 149 mg/dL Final         Passed - AST in normal range and within 360 days    AST  Date Value Ref Range Status  06/22/2023 19 0 - 40 IU/L Final         Passed - ALT in normal range and within 360 days    ALT  Date Value Ref Range Status  06/22/2023 18 0 - 32 IU/L Final         Passed - Patient is not pregnant

## 2023-10-31 NOTE — Telephone Encounter (Signed)
 Requested medication (s) are due for refill today: yes  Requested medication (s) are on the active medication list: yes  Last refill:  07/06/23 #12 4 RF  Future visit scheduled: yes  Notes to clinic:  Med not delegated to NT to RF   Requested Prescriptions  Pending Prescriptions Disp Refills   Vitamin D , Ergocalciferol , 50000 units CAPS [Pharmacy Med Name: VITAMIN D2 1.25MG (50,000 UNIT)] 12 capsule 0    Sig: Take 1 capsule (50,000 Units total) by mouth once a week.     Endocrinology:  Vitamins - Vitamin D  Supplementation 2 Failed - 10/31/2023 11:16 AM      Failed - Manual Review: Route requests for 50,000 IU strength to the provider      Failed - Valid encounter within last 12 months    Recent Outpatient Visits   None     Future Appointments             In 1 month Allison Pham, Allison Posey, NP [facility] [facility]            Passed - Ca in normal range and within 360 days    Calcium   Date Value Ref Range Status  06/22/2023 9.9 8.7 - 10.3 mg/dL Final         Passed - Vitamin D  in normal range and within 360 days    Vit D, 25-Hydroxy  Date Value Ref Range Status  12/19/2022 70.2 30.0 - 100.0 ng/mL Final    Comment:    Vitamin D  deficiency has been defined by the Institute of Medicine and an Endocrine Society practice guideline as a level of serum 25-OH vitamin D  less than 20 ng/mL (1,2). The Endocrine Society went on to further define vitamin D  insufficiency as a level between 21 and 29 ng/mL (2). 1. IOM (Institute of Medicine). 2010. Dietary reference    intakes for calcium  and D. [place]: The    [employer]. 2. Holick MF, Binkley [place], Bischoff-Ferrari HA, et al.    Evaluation, treatment, and prevention of vitamin D     deficiency: an Endocrine Society clinical practice    guideline. JCEM. 2011 Jul; 96(7):1911-30.

## 2023-12-22 NOTE — Patient Instructions (Incomplete)
 Try Senna S over the counter -- take as instructed on bottle.  Be Involved in Caring For Your Health:  Taking Medications When medications are taken as directed, they can greatly improve your health. But if they are not taken as prescribed, they may not work. In some cases, not taking them correctly can be harmful. To help ensure your treatment remains effective and safe, understand your medications and how to take them. Bring your medications to each visit for review by your provider.  Your lab results, notes, and after visit summary will be available on My Chart. We strongly encourage you to use this feature. If lab results are abnormal the clinic will contact you with the appropriate steps. If the clinic does not contact you assume the results are satisfactory. You can always view your results on My Chart. If you have questions regarding your health or results, please contact the clinic during office hours. You can also ask questions on My Chart.  We at [facility] are grateful that you chose us  to provide your care. We strive to provide evidence-based and compassionate care and are always looking for feedback. If you get a survey from the clinic please complete this so we can hear your opinions.  DASH Eating Plan DASH stands for Dietary Approaches to Stop Hypertension. The DASH eating plan is a healthy eating plan that has been shown to: Lower high blood pressure (hypertension). Reduce your risk for type 2 diabetes, heart disease, and stroke. Help with weight loss. What are tips for following this plan? Reading food labels Check food labels for the amount of salt (sodium) per serving. Choose foods with less than 5 percent of the Daily Value (DV) of sodium. In general, foods with less than 300 milligrams (mg) of sodium per serving fit into this eating plan. To find whole grains, look for the word whole as the first word in the ingredient list. Shopping Buy products labeled as  low-sodium or no salt added. Buy fresh foods. Avoid canned foods and pre-made or frozen meals. Cooking Try not to add salt when you cook. Use salt-free seasonings or herbs instead of table salt or sea salt. Check with your health care provider or pharmacist before using salt substitutes. Do not fry foods. Cook foods in healthy ways, such as baking, boiling, grilling, roasting, or broiling. Cook using oils that are good for your heart. These include olive, canola, avocado, soybean, and sunflower oil. Meal planning  Eat a balanced diet. This should include: 4 or more servings of fruits and 4 or more servings of vegetables each day. Try to fill half of your plate with fruits and vegetables. 6-8 servings of whole grains each day. 6 or less servings of lean meat, poultry, or fish each day. 1 oz is 1 serving. A 3 oz (85 g) serving of meat is about the same size as the palm of your hand. One egg is 1 oz (28 g). 2-3 servings of low-fat dairy each day. One serving is 1 cup (237 mL). 1 serving of nuts, seeds, or beans 5 times each week. 2-3 servings of heart-healthy fats. Healthy fats called omega-3 fatty acids are found in foods such as walnuts, flaxseeds, fortified milks, and eggs. These fats are also found in cold-water  fish, such as sardines, salmon, and mackerel. Limit how much you eat of: Canned or prepackaged foods. Food that is high in trans fat, such as fried foods. Food that is high in saturated fat, such as fatty meat.  Desserts and other sweets, sugary drinks, and other foods with added sugar. Full-fat dairy products. Do not salt foods before eating. Do not eat more than 4 egg yolks a week. Try to eat at least 2 vegetarian meals a week. Eat more home-cooked food and less restaurant, buffet, and fast food. Lifestyle When eating at a restaurant, ask if your food can be made with less salt or no salt. If you drink alcohol: Limit how much you have to: 0-1 drink a day if you are  female. 0-2 drinks a day if you are female. Know how much alcohol is in your drink. In the [place] one drink is one 12 oz bottle of beer (355 mL), one 5 oz glass of wine (148 mL), or one 1 oz glass of hard liquor (44 mL). General information Avoid eating more than 2,300 mg of salt a day. If you have hypertension, you may need to reduce your sodium intake to 1,500 mg a day. Work with your provider to stay at a healthy body weight or lose weight. Ask what the best weight range is for you. On most days of the week, get at least 30 minutes of exercise that causes your heart to beat faster. This may include walking, swimming, or biking. Work with your provider or dietitian to adjust your eating plan to meet your specific calorie needs. What foods should I eat? Fruits All fresh, dried, or frozen fruit. Canned fruits that are in their natural juice and do not have sugar added to them. Vegetables Fresh or frozen vegetables that are raw, steamed, roasted, or grilled. Low-sodium or reduced-sodium tomato and vegetable juice. Low-sodium or reduced-sodium tomato sauce and tomato paste. Low-sodium or reduced-sodium canned vegetables. Grains Whole-grain or whole-wheat bread. Whole-grain or whole-wheat pasta. Brown rice. [place]il [place]. Bulgur. Whole-grain and low-sodium cereals. Pita bread. Low-fat, low-sodium crackers. Whole-wheat flour tortillas. Meats and other proteins Skinless chicken or [place]. Ground chicken or [place]. Pork with fat trimmed off. Fish and seafood. Egg whites. Dried beans, peas, or lentils. Unsalted nuts, nut butters, and seeds. Unsalted canned beans. Lean cuts of beef with fat trimmed off. Low-sodium, lean precooked or cured meat, such as sausages or meat loaves. Dairy Low-fat (1%) or fat-free (skim) milk. Reduced-fat, low-fat, or fat-free cheeses. Nonfat, low-sodium ricotta or cottage cheese. Low-fat or nonfat yogurt. Low-fat, low-sodium cheese. Fats and oils Soft margarine without trans  fats. Vegetable oil. Reduced-fat, low-fat, or light mayonnaise and salad dressings (reduced-sodium). Canola, safflower, olive, avocado, soybean, and sunflower oils. Avocado. Seasonings and condiments Herbs. Spices. Seasoning mixes without salt. Other foods Unsalted popcorn and pretzels. Fat-free sweets. The items listed above may not be all the foods and drinks you can have. Talk to a dietitian to learn more. What foods should I avoid? Fruits Canned fruit in a light or heavy syrup. Fried fruit. Fruit in cream or butter sauce. Vegetables Creamed or fried vegetables. Vegetables in a cheese sauce. Regular canned vegetables that are not marked as low-sodium or reduced-sodium. Regular canned tomato sauce and paste that are not marked as low-sodium or reduced-sodium. Regular tomato and vegetable juices that are not marked as low-sodium or reduced-sodium. Dene. Olives. Grains Baked goods made with fat, such as croissants, muffins, or some breads. Dry pasta or rice meal packs. Meats and other proteins Fatty cuts of meat. Ribs. Fried meat. [place]. Bologna, salami, and other precooked or cured meats, such as sausages or meat loaves, that are not lean and low in sodium. Fat from the back of a  pig (fatback). Bratwurst. Salted nuts and seeds. Canned beans with added salt. Canned or smoked fish. Whole eggs or egg yolks. Chicken or [place] with skin. Dairy Whole or 2% milk, cream, and half-and-half. Whole or full-fat cream cheese. Whole-fat or sweetened yogurt. Full-fat cheese. Nondairy creamers. Whipped toppings. Processed cheese and cheese spreads. Fats and oils Butter. Stick margarine. Lard. Shortening. Ghee. Bacon fat. Tropical oils, such as coconut, palm kernel, or palm oil. Seasonings and condiments Onion salt, garlic salt, seasoned salt, table salt, and sea salt. Worcestershire sauce. Tartar sauce. Barbecue sauce. Teriyaki sauce. Soy sauce, including reduced-sodium soy sauce. Steak sauce. Canned and  packaged gravies. Fish sauce. Oyster sauce. Cocktail sauce. Store-bought horseradish. Ketchup. Mustard. Meat flavorings and tenderizers. Bouillon cubes. Hot sauces. Pre-made or packaged marinades. Pre-made or packaged taco seasonings. Relishes. Regular salad dressings. Other foods Salted popcorn and pretzels. The items listed above may not be all the foods and drinks you should avoid. Talk to a dietitian to learn more. Where to find more information National Heart, Lung, and Blood Institute (NHLBI): BuffaloDryCleaner.gl American Heart Association (AHA): heart.org Academy of Nutrition and Dietetics: eatright.org National Kidney Foundation (NKF): kidney.org This information is not intended to replace advice given to you by your health care provider. Make sure you discuss any questions you have with your health care provider. Document Revised: 06/29/2022 Document Reviewed: 06/29/2022 Elsevier Patient Education  2024 [employer].

## 2023-12-25 ENCOUNTER — Encounter: Payer: Self-pay | Admitting: Nurse Practitioner

## 2023-12-25 ENCOUNTER — Ambulatory Visit (INDEPENDENT_AMBULATORY_CARE_PROVIDER_SITE_OTHER): Payer: Self-pay | Admitting: Nurse Practitioner

## 2023-12-25 ENCOUNTER — Ambulatory Visit: Payer: No Typology Code available for payment source | Admitting: Nurse Practitioner

## 2023-12-25 VITALS — BP 125/81 | HR 64 | Temp 97.6°F | Ht 62.0 in | Wt 173.2 lb

## 2023-12-25 DIAGNOSIS — R748 Abnormal levels of other serum enzymes: Secondary | ICD-10-CM | POA: Diagnosis not present

## 2023-12-25 DIAGNOSIS — F320 Major depressive disorder, single episode, mild: Secondary | ICD-10-CM | POA: Diagnosis not present

## 2023-12-25 DIAGNOSIS — J432 Centrilobular emphysema: Secondary | ICD-10-CM

## 2023-12-25 DIAGNOSIS — E66811 Obesity, class 1: Secondary | ICD-10-CM

## 2023-12-25 DIAGNOSIS — I6523 Occlusion and stenosis of bilateral carotid arteries: Secondary | ICD-10-CM

## 2023-12-25 DIAGNOSIS — E785 Hyperlipidemia, unspecified: Secondary | ICD-10-CM | POA: Diagnosis not present

## 2023-12-25 DIAGNOSIS — F411 Generalized anxiety disorder: Secondary | ICD-10-CM | POA: Diagnosis not present

## 2023-12-25 DIAGNOSIS — D582 Other hemoglobinopathies: Secondary | ICD-10-CM | POA: Diagnosis not present

## 2023-12-25 DIAGNOSIS — Z87891 Personal history of nicotine dependence: Secondary | ICD-10-CM | POA: Diagnosis not present

## 2023-12-25 DIAGNOSIS — I10 Essential (primary) hypertension: Secondary | ICD-10-CM

## 2023-12-25 DIAGNOSIS — E559 Vitamin D deficiency, unspecified: Secondary | ICD-10-CM

## 2023-12-25 DIAGNOSIS — K5901 Slow transit constipation: Secondary | ICD-10-CM | POA: Diagnosis not present

## 2023-12-25 LAB — CBC
Hematocrit: 46.9 % — ABNORMAL HIGH (ref 34.0–46.6)
Hemoglobin: 16.3 g/dL — ABNORMAL HIGH (ref 11.1–15.9)
MCH: 29.4 pg (ref 26.6–33.0)
MCHC: 34.8 g/dL (ref 31.5–35.7)
MCV: 85 fL (ref 79–97)
Platelets: 123 10*3/uL — ABNORMAL LOW (ref 150–450)
RBC: 5.54 x10E6/uL — ABNORMAL HIGH (ref 3.77–5.28)
RDW: 14.3 % (ref 11.7–15.4)
WBC: 9.3 10*3/uL (ref 3.4–10.8)

## 2023-12-25 MED ORDER — VITAMIN D (ERGOCALCIFEROL) 50000 UNITS PO CAPS: 1.0000 | ORAL_CAPSULE | ORAL | 3 refills | Status: AC

## 2023-12-25 MED ORDER — ROSUVASTATIN CALCIUM 40 MG PO TABS: 40.0000 mg | ORAL_TABLET | Freq: Every day | ORAL | 4 refills | Status: AC

## 2023-12-25 MED ORDER — AMLODIPINE BESYLATE 5 MG PO TABS: 10.0000 mg | ORAL_TABLET | Freq: Every day | ORAL | 4 refills | Status: AC

## 2023-12-25 MED ORDER — GABAPENTIN 300 MG PO CAPS: 300.0000 mg | ORAL_CAPSULE | Freq: Every evening | ORAL | 3 refills | Status: AC | PRN

## 2023-12-25 MED ORDER — BUSPIRONE HCL 5 MG PO TABS: 2.5000 mg | ORAL_TABLET | Freq: Three times a day (TID) | ORAL | 3 refills | Status: AC | PRN

## 2023-12-25 MED ORDER — EZETIMIBE 10 MG PO TABS: 10.0000 mg | ORAL_TABLET | Freq: Every day | ORAL | 3 refills | Status: AC

## 2023-12-25 NOTE — Assessment & Plan Note (Signed)
 Chronic, ongoing.  Is tolerating Zetia  and Crestor .  Will check lipid panel and CMP today.  Adjust regimen as needed. May need to send to lipid clinic if ongoing elevations.

## 2023-12-25 NOTE — Assessment & Plan Note (Signed)
Ongoing and stable.  Continue supplement and check Vit D level today. 

## 2023-12-25 NOTE — Assessment & Plan Note (Signed)
 Chronic, stable with no inhalers.  Will plan on spirometry next visit.  If worsening symptoms initiate inhaler regimen.  Continue yearly lung screening -- recommend she call to schedule this and provided number.

## 2023-12-25 NOTE — Progress Notes (Signed)
 BP 125/81   Pulse 64   Temp 97.6 F (36.4 C) (Oral)   Ht 5' 2 (1.575 m)   Wt 173 lb 3.2 oz (78.6 kg)   SpO2 98%   BMI 31.68 kg/m    Subjective:    Patient ID: Allison Pham, female    DOB: 1960/08/10, 63 y.o.   MRN: [id number]  HPI: Allison Pham is a 63 y.o. female  Chief Complaint  Patient presents with   COPD   Depression   Hyperlipidemia   Hypertension   HYPERTENSION/HLD Taking Amlodipine  10 MG & Losartan  100 MG daily. Taking Rosuvastatin  and Zetia  daily.  HISTORY: History of Praluent , stopped taking in November 2024 due to stomach pain with this and increased BM + nausea and could not eat.  Previously took Pravastatin , but could not get to goal.  Also took Atorvastatin  in past with side effects. Satisfied with current treatment? yes Duration of hypertension: chronic BP monitoring frequency: daily BP range: on average <120/80 BP medication side effects: no Past BP meds: multiple kinds Duration of hyperlipidemia: chronic Cholesterol medication side effects: no Cholesterol supplements: none Past cholesterol medications: multiple different kinds Medication compliance: good compliance Aspirin: no Recent stressors: no Recurrent headaches: no Visual changes: no Palpitations: very rare flutters Dyspnea: no Chest pain: no Lower extremity edema: no Dizzy/lightheaded: no  The 10-year ASCVD risk score (Arnett DK, et al., 2019) is: 8%   Values used to calculate the score:     Age: 2 years     Clincally relevant sex: Female     Is Non-Hispanic African American: No     Diabetic: No     Tobacco smoker: No     Systolic Blood Pressure: 125 mmHg     Is BP treated: Yes     HDL Cholesterol: 40 mg/dL     Total Cholesterol: 228 mg/dL  COPD In past was 1 PPD smoker in past. Quit >6 years ago.  No current symptoms or inhalers. There are smokers in her household. Centrilobular mild noted on lung CT 2019. COPD status: stable Satisfied with current treatment?:  yes Oxygen use: no Dyspnea frequency:  none Cough frequency: none Limitation of activity: no Productive cough: no Last Spirometry: unknown Pneumovax:  refuses Influenza:  refuses  ANXIETY/DEPRESSION Continues Buspar , Gabapentin , and Trazodone .  Having anxiety with driving down the road to [place], Buspar  does help her.  Got anxious coming into office today.  Has been having constipation recently, taking Metamucil without much benefit. Mood status: stable Satisfied with current treatment?: yes Symptom severity: moderate  Duration of current treatment : chronic Side effects: no Medication compliance: good compliance Psychotherapy/counseling: none Depressed mood: no Anxious mood: occasional Anhedonia: occasional Significant weight loss or gain: no Insomnia: none Fatigue: no Feelings of worthlessness or guilt: no Impaired concentration/indecisiveness: no Suicidal ideations: no Hopelessness: no Crying spells: no    12/25/2023   10:46 AM 06/22/2023   10:59 AM 03/27/2023   11:21 AM 12/19/2022   11:22 AM 10/23/2022    3:00 PM  Depression screen PHQ 2/9  Decreased Interest 1 1 2 1 2   Down, Depressed, Hopeless 1 2 1 1 1   PHQ - 2 Score 2 3 3 2 3   Altered sleeping 1 1 2 1 1   Tired, decreased energy 1 2 2 1 2   Change in appetite 2 1 1  0 1  Feeling bad or failure about yourself  1 1 1 1 2   Trouble concentrating 2 1 2  0 1  Moving  slowly or fidgety/restless 0 0 1 0 0  Suicidal thoughts 0 0 0 0 0  PHQ-9 Score 9 9 12 5 10   Difficult doing work/chores Not difficult at all Somewhat difficult Not difficult at all Not difficult at all        12/25/2023   10:47 AM 06/22/2023   10:59 AM 03/27/2023   11:22 AM 12/19/2022   11:22 AM  GAD 7 : Generalized Anxiety Score  Nervous, Anxious, on Edge 1 1 2 1   Control/stop worrying 1 1 1  0  Worry too much - different things 1 1 1 1   Trouble relaxing 1 2 1 1   Restless 0 1 1 0  Easily annoyed or irritable 1 1 1 1   Afraid - awful might happen 0 1  1 0  Total GAD 7 Score 5 8 8 4   Anxiety Difficulty Not difficult at all Somewhat difficult Somewhat difficult Not difficult at all   Relevant past medical, surgical, family and social history reviewed and updated as indicated. Interim medical history since our last visit reviewed. Allergies and medications reviewed and updated.  Review of Systems  Constitutional:  Negative for activity change, appetite change, diaphoresis, fatigue and fever.  Respiratory:  Negative for cough, chest tightness, shortness of breath and wheezing.   Cardiovascular:  Negative for chest pain, palpitations and leg swelling.  Gastrointestinal:  Positive for constipation. Negative for abdominal distention, abdominal pain, diarrhea, nausea and vomiting.  Neurological: Negative.   Psychiatric/Behavioral: Negative.      Per HPI unless specifically indicated above     Objective:    BP 125/81   Pulse 64   Temp 97.6 F (36.4 C) (Oral)   Ht 5' 2 (1.575 m)   Wt 173 lb 3.2 oz (78.6 kg)   SpO2 98%   BMI 31.68 kg/m   Wt Readings from Last 3 Encounters:  12/25/23 173 lb 3.2 oz (78.6 kg)  06/22/23 168 lb 9.6 oz (76.5 kg)  03/27/23 168 lb 9.6 oz (76.5 kg)    Physical Exam Vitals and nursing note reviewed.  Constitutional:      General: She is awake. She is not in acute distress.    Appearance: She is well-developed and well-groomed. She is obese. She is not ill-appearing or toxic-appearing.  HENT:     Head: Normocephalic.     Right Ear: Hearing and external ear normal.     Left Ear: Hearing and external ear normal.   Eyes:     General: Lids are normal.        Right eye: No discharge.        Left eye: No discharge.     Conjunctiva/sclera: Conjunctivae normal.     Pupils: Pupils are equal, round, and reactive to light.   Neck:     Thyroid : No thyromegaly.     Vascular: No carotid bruit.   Cardiovascular:     Rate and Rhythm: Normal rate and regular rhythm.     Heart sounds: Normal heart sounds. No  murmur heard.    No gallop.  Pulmonary:     Effort: Pulmonary effort is normal. No accessory muscle usage or respiratory distress.     Breath sounds: Normal breath sounds. No decreased breath sounds, wheezing or rales.  Abdominal:     General: Bowel sounds are normal. There is no distension.     Palpations: Abdomen is soft.     Tenderness: There is no abdominal tenderness.   Musculoskeletal:     Cervical back: Normal  range of motion and neck supple.     Right lower leg: No edema.     Left lower leg: No edema.  Lymphadenopathy:     Cervical: No cervical adenopathy.   Skin:    General: Skin is warm and dry.   Neurological:     Mental Status: She is alert and oriented to person, place, and time.     Deep Tendon Reflexes: Reflexes are normal and symmetric.     Reflex Scores:      Brachioradialis reflexes are 2+ on the right side and 2+ on the left side.      Patellar reflexes are 2+ on the right side and 2+ on the left side.  Psychiatric:        Attention and Perception: Attention normal.        Mood and Affect: Mood normal.        Speech: Speech normal.        Behavior: Behavior normal. Behavior is cooperative.        Thought Content: Thought content normal.    Results for orders placed or performed in visit on 06/22/23  Comprehensive metabolic panel   Collection Time: 06/22/23 11:01 AM  Result Value Ref Range   Glucose 94 70 - 99 mg/dL   BUN 18 8 - 27 mg/dL   Creatinine, Ser 8.99 0.57 - 1.00 mg/dL   eGFR 63 >40 fO/fpw/8.26   BUN/Creatinine Ratio 18 12 - 28   Sodium 143 134 - 144 mmol/L   Potassium 4.4 3.5 - 5.2 mmol/L   Chloride 108 (H) 96 - 106 mmol/L   CO2 21 20 - 29 mmol/L   Calcium  9.9 8.7 - 10.3 mg/dL   Total Protein 6.4 6.0 - 8.5 g/dL   Albumin 4.0 3.9 - 4.9 g/dL   Globulin, Total 2.4 1.5 - 4.5 g/dL   Bilirubin Total 0.3 0.0 - 1.2 mg/dL   Alkaline Phosphatase 103 44 - 121 IU/L   AST 19 0 - 40 IU/L   ALT 18 0 - 32 IU/L  Lipid Panel w/o Chol/HDL Ratio    Collection Time: 06/22/23 11:01 AM  Result Value Ref Range   Cholesterol, Total 228 (H) 100 - 199 mg/dL   Triglycerides 797 (H) 0 - 149 mg/dL   HDL 40 >60 mg/dL   VLDL Cholesterol Cal 37 5 - 40 mg/dL   LDL Chol Calc (NIH) 848 (H) 0 - 99 mg/dL  CBC with Differential/Platelet   Collection Time: 06/22/23 11:01 AM  Result Value Ref Range   WBC 8.0 3.4 - 10.8 x10E3/uL   RBC 5.75 (H) 3.77 - 5.28 x10E6/uL   Hemoglobin 16.4 (H) 11.1 - 15.9 g/dL   Hematocrit 50.2 (H) 65.9 - 46.6 %   MCV 86 79 - 97 fL   MCH 28.5 26.6 - 33.0 pg   MCHC 33.0 31.5 - 35.7 g/dL   RDW 86.4 88.2 - 84.5 %   Platelets 244 150 - 450 x10E3/uL   Neutrophils 39 Not Estab. %   Lymphs 48 Not Estab. %   Monocytes 9 Not Estab. %   Eos 3 Not Estab. %   Basos 1 Not Estab. %   Neutrophils Absolute 3.1 1.4 - 7.0 x10E3/uL   Lymphocytes Absolute 3.8 (H) 0.7 - 3.1 x10E3/uL   Monocytes Absolute 0.7 0.1 - 0.9 x10E3/uL   EOS (ABSOLUTE) 0.2 0.0 - 0.4 x10E3/uL   Basophils Absolute 0.1 0.0 - 0.2 x10E3/uL   Immature Granulocytes 0 Not Estab. %   Immature  Grans (Abs) 0.0 0.0 - 0.1 x10E3/uL      Assessment & Plan:   Problem List Items Addressed This Visit       Cardiovascular and Mediastinum   Essential hypertension, benign (Chronic)   Chronic, stable.  BP at goal in office and on home checks.  Recommend she monitor BP at least a few mornings a week at home and document.  DASH diet at home.  Continue current medication regimen and adjust as needed, may be able to reduce Amlodipine  back down in future if BP remains stable.  Labs: CBC, TSH, CMP.  Refills up to date.  Return in 6 months.      Relevant Medications   amLODipine  (NORVASC ) 5 MG tablet   ezetimibe  (ZETIA ) 10 MG tablet   rosuvastatin  (CRESTOR ) 40 MG tablet   Other Relevant Orders   CBC with Differential/Platelet   Comprehensive metabolic panel with GFR   TSH   Carotid atherosclerosis, bilateral   Ongoing and stable.  Is tolerating Zetia  and Crestor , but did not  tolerate Praluent  and other statins in past.  Will check lipid panel and CMP today.  Adjust regimen as needed.  May need lipid clinic in future.      Relevant Medications   amLODipine  (NORVASC ) 5 MG tablet   ezetimibe  (ZETIA ) 10 MG tablet   rosuvastatin  (CRESTOR ) 40 MG tablet     Respiratory   Centrilobular emphysema (HCC) - Primary (Chronic)   Chronic, stable with no inhalers.  Will plan on spirometry next visit.  If worsening symptoms initiate inhaler regimen.  Continue yearly lung screening -- recommend she call to schedule this and provided number.          Other   Vitamin D  deficiency (Chronic)   Ongoing and stable.  Continue supplement and check Vit D level today.      Relevant Orders   VITAMIN D  25 Hydroxy (Vit-D Deficiency, Fractures)   Obesity (BMI 30.0-34.9) (Chronic)   BMI 31.68. Recommended eating smaller high protein, low fat meals more frequently and exercising 30 mins a day 5 times a week with a goal of 10-15lb weight loss in the next 3 months. Patient voiced their understanding and motivation to adhere to these recommendations.          Generalized anxiety disorder (Chronic)   Refer to depression plan of care.      Relevant Medications   busPIRone  (BUSPAR ) 5 MG tablet   Elevated serum GGT level (Chronic)   Check CMP and GGT today.  Consider repeat imaging in future.      Relevant Orders   Gamma GT   Dyslipidemia (Chronic)   Chronic, ongoing.  Is tolerating Zetia  and Crestor .  Will check lipid panel and CMP today.  Adjust regimen as needed. May need to send to lipid clinic if ongoing elevations.      Relevant Medications   ezetimibe  (ZETIA ) 10 MG tablet   rosuvastatin  (CRESTOR ) 40 MG tablet   Other Relevant Orders   Comprehensive metabolic panel with GFR   Lipid Panel w/o Chol/HDL Ratio   Depression, major, single episode, mild (HCC) (Chronic)   Chronic, ongoing.  Denies SI/HI.  Reports benefit from current regimen, will continue this and send refills  as needed.  Recommend therapy visits as needed.  Return in 6 months.      Relevant Medications   busPIRone  (BUSPAR ) 5 MG tablet   Smoking history   Recommend she continue yearly lung screening and to schedule this, provided pamphlet with  number to call and schedule.        Elevated hemoglobin (HCC)   Ongoing, is a past smoker. Recheck today and if continued elevations may send to hematology for further assessment.      Constipation   Chronic, occasional bouts of this.  Recommend she continue Metamucil gummies or powder daily to help with fiber intake, this may benefit cholesterol level as well.  Can take Senna S as needed for episodes of constipation not resolved with Metamucil.  Recommend she ensure increased water  intake at home and fiber.        Follow up plan: Return in about 6 months (around 06/26/2024) for HTN/HLD, MOOD, COPD + needs Medicare Wellness scheduled.

## 2023-12-25 NOTE — Addendum Note (Signed)
 Addended by: NELWYN LAYMON SAILOR on: 12/25/2023 11:13 AM   Modules accepted: Orders

## 2023-12-25 NOTE — Assessment & Plan Note (Signed)
 Ongoing, is a past smoker. Recheck today and if continued elevations may send to hematology for further assessment.

## 2023-12-25 NOTE — Assessment & Plan Note (Signed)
 Check CMP and GGT today.  Consider repeat imaging in future.

## 2023-12-25 NOTE — Assessment & Plan Note (Signed)
 Ongoing and stable.  Is tolerating Zetia  and Crestor , but did not tolerate Praluent  and other statins in past.  Will check lipid panel and CMP today.  Adjust regimen as needed.  May need lipid clinic in future.

## 2023-12-25 NOTE — Assessment & Plan Note (Signed)
 Refer to depression plan of care.

## 2023-12-25 NOTE — Assessment & Plan Note (Addendum)
 Chronic, ongoing.  Denies SI/HI.  Reports benefit from current regimen, will continue this and send refills as needed.  Recommend therapy visits as needed.  Return in 6 months.

## 2023-12-25 NOTE — Assessment & Plan Note (Signed)
BMI 31.68.  Recommended eating smaller high protein, low fat meals more frequently and exercising 30 mins a day 5 times a week with a goal of 10-15lb weight loss in the next 3 months. Patient voiced their understanding and motivation to adhere to these recommendations.

## 2023-12-25 NOTE — Assessment & Plan Note (Signed)
Recommend she continue yearly lung screening and to schedule this, provided pamphlet with number to call and schedule.

## 2023-12-25 NOTE — Assessment & Plan Note (Signed)
 Chronic, occasional bouts of this.  Recommend she continue Metamucil gummies or powder daily to help with fiber intake, this may benefit cholesterol level as well.  Can take Senna S as needed for episodes of constipation not resolved with Metamucil.  Recommend she ensure increased water  intake at home and fiber.

## 2023-12-25 NOTE — Assessment & Plan Note (Signed)
 Chronic, stable.  BP at goal in office and on home checks.  Recommend she monitor BP at least a few mornings a week at home and document.  DASH diet at home.  Continue current medication regimen and adjust as needed, may be able to reduce Amlodipine  back down in future if BP remains stable.  Labs: CBC, TSH, CMP.  Refills up to date.  Return in 6 months.

## 2023-12-25 NOTE — Addendum Note (Signed)
 Addended by: NELWYN LAYMON SAILOR on: 12/25/2023 11:10 AM   Modules accepted: Orders

## 2023-12-26 ENCOUNTER — Ambulatory Visit: Payer: Self-pay | Admitting: Nurse Practitioner

## 2023-12-26 LAB — COMPREHENSIVE METABOLIC PANEL WITH GFR
ALT: 28 IU/L (ref 0–32)
AST: 23 IU/L (ref 0–40)
Albumin: 4.2 g/dL (ref 3.9–4.9)
Alkaline Phosphatase: 129 IU/L — ABNORMAL HIGH (ref 44–121)
BUN/Creatinine Ratio: 19 (ref 12–28)
BUN: 18 mg/dL (ref 8–27)
Bilirubin Total: 0.5 mg/dL (ref 0.0–1.2)
CO2: 20 mmol/L (ref 20–29)
Calcium: 9.5 mg/dL (ref 8.7–10.3)
Chloride: 105 mmol/L (ref 96–106)
Creatinine, Ser: 0.96 mg/dL (ref 0.57–1.00)
Globulin, Total: 2.5 g/dL (ref 1.5–4.5)
Glucose: 98 mg/dL (ref 70–99)
Potassium: 4 mmol/L (ref 3.5–5.2)
Sodium: 141 mmol/L (ref 134–144)
Total Protein: 6.7 g/dL (ref 6.0–8.5)
eGFR: 66 mL/min/{1.73_m2} (ref >59)

## 2023-12-26 LAB — LIPID PANEL W/O CHOL/HDL RATIO
Cholesterol, Total: 163 mg/dL (ref 100–199)
HDL: 38 mg/dL — ABNORMAL LOW (ref >39)
LDL Chol Calc (NIH): 98 mg/dL (ref 0–99)
Triglycerides: 155 mg/dL — ABNORMAL HIGH (ref 0–149)
VLDL Cholesterol Cal: 27 mg/dL (ref 5–40)

## 2023-12-26 LAB — TSH: TSH: 2.18 u[IU]/mL (ref 0.450–4.500)

## 2023-12-26 LAB — VITAMIN D 25 HYDROXY (VIT D DEFICIENCY, FRACTURES): Vit D, 25-Hydroxy: 84 ng/mL (ref 30.0–100.0)

## 2023-12-26 LAB — GAMMA GT: GGT: 188 IU/L — ABNORMAL HIGH (ref 0–60)

## 2023-12-26 NOTE — Progress Notes (Signed)
 Contacted via MyChart  Good afternoon Allison Pham, your labs have returned: - Kidney function, creatinine and eGFR, remains normal, as is liver function, AST and ALT.  - Alkaline phosphatase and GGT remain a bit elevated and we can continue to monitor + consider imaging in future of liver and gall bladder. - Lipid panel is showing trend down in LDL, great news.  Continue Rosuvastatin . - Remainder of labs stable.  Any questions? Keep being amazing!!  Thank you for allowing me to participate in your care.  I appreciate you. Kindest regards, Estuardo Frisbee

## 2024-01-14 ENCOUNTER — Ambulatory Visit
Admission: EM | Admit: 2024-01-14 | Discharge: 2024-01-14 | Disposition: A | Discharge status: Home / Self Care | Attending: Family Medicine | Admitting: Family Medicine

## 2024-01-14 ENCOUNTER — Ambulatory Visit: Payer: Self-pay

## 2024-01-14 DIAGNOSIS — W57XXXA Bitten or stung by nonvenomous insect and other nonvenomous arthropods, initial encounter: Secondary | ICD-10-CM

## 2024-01-14 DIAGNOSIS — S30860A Insect bite (nonvenomous) of lower back and pelvis, initial encounter: Secondary | ICD-10-CM | POA: Diagnosis not present

## 2024-01-14 DIAGNOSIS — L259 Unspecified contact dermatitis, unspecified cause: Secondary | ICD-10-CM

## 2024-01-14 MED ORDER — TRIAMCINOLONE ACETONIDE 0.1 % EX OINT: 1.0000 | TOPICAL_OINTMENT | Freq: Two times a day (BID) | CUTANEOUS | 0 refills | Status: DC

## 2024-01-14 MED ORDER — DEXAMETHASONE SODIUM PHOSPHATE 10 MG/ML IJ SOLN
10.0000 mg | Freq: Once | INTRAMUSCULAR | Status: AC
  Administered 2024-01-14: 10 mg via INTRAMUSCULAR

## 2024-01-14 MED ORDER — PREDNISONE 10 MG (21) PO TBPK: ORAL_TABLET | Freq: Every day | ORAL | 0 refills | Status: DC

## 2024-01-14 NOTE — Discharge Instructions (Signed)
 Stop by the pharmacy to pick up your prescriptions.  Follow up with your primary care provider or return to the urgent care, if not improving.

## 2024-01-14 NOTE — ED Triage Notes (Signed)
 Patient was stung by something Thursday on her left side. She's used benadryl , Cortizone cream nothing helps. Patient has a rash in the area now.

## 2024-01-14 NOTE — Telephone Encounter (Signed)
 FYI Only or Action Required?: FYI only for provider.  Patient was last seen in primary care on 12/25/2023 by Cannady, [name] T, NP.  Called Nurse Triage reporting No chief complaint on file..  Symptoms began yesterday.  Interventions attempted: Nothing.  Symptoms are: gradually worsening.  Triage Disposition: No disposition on file.  Patient/caregiver understands and will follow disposition?:    Copied from CRM [id number]. Topic: Clinical - Red Word Triage >> Jan 14, 2024  9:21 AM Larissa RAMAN wrote: [facility] that prompted transfer to Nurse Triage: insect bite with redness-LT side Answer Assessment - Initial Assessment Questions 1. TYPE of INSECT: What type of insect was it?      Unsure  2. ONSET: When did you get bitten?      Yesterday  3. LOCATION: Where is the insect bite located?      Left Side, Unsure of approximate location  4. REDNESS: Is the area red or pink? If Yes, ask: What size is the area of redness? (inches or cm). When did the redness start?     Redness  5. PAIN: Is there any pain? If Yes, ask: How bad is the pain? (Scale 0-10; or none, mild, moderate, severe)     Denies  6. ITCHING: Does it itch? If Yes, ask: How bad is the itch?      Denies  7. SWELLING: How big is the swelling? (e.g., inches, cm, or compare to coins)     No  8. OTHER SYMPTOMS: Do you have any other symptoms?  (e.g., difficulty breathing, fever, hives)     No  9. PREGNANCY: Is there any chance you are pregnant? When was your last menstrual period?     No and No  Protocols used: Insect Bite-A-AH

## 2024-01-14 NOTE — ED Provider Notes (Signed)
 [facility]    CSN: [id number] Arrival date & time: 01/14/24  1124      History   Chief Complaint Chief Complaint  Patient presents with   Insect Bite    HPI Allison Pham is a 63 y.o. female.   HPI  Allison Pham presents after being stung on her left flank by an insect on Thursday. Has been applying    She was washing on the deck and something flew underneath her blouse and bit/stung her.  She called her doctor but they told her to go the urgent care.  The area is reb but is not very ditchy.  Has been putting Cortisone 10 and taking chrildrens Benadyl.  Put some cold towels on it but the ras his not going away.     No headache, vomiting, diarrhea, fever, headache or jont pain.     There is been no new products including soaps and detergents.  No eye irritation, sore throat, difficulty breathing, nausea, vomiting or diarrhea.  Denies belly pain, joint pain and fever.  There has been no medication changes or new supplements.  Denies any new foods or drinks.    ***Redness, pain, swelling ***Treatments tried ***Previous symptoms ***Insect or bug bites  Fever : no Vision changes: No Sore throat: no   Shortness of breath: no Rhinorrhea: no Appetite: normal  Hydration: normal  Abdominal pain: no Nausea: no Vomiting: no Diarrhea: no Dysuria: no  Sleep disturbance: no Arthralgias: no Headache: no   Past Medical History:  Diagnosis Date   Anxiety    Carotid atherosclerosis, bilateral 07/16/2018   rescan Jan 2021   Emphysema of lung (HCC) 03/21/2018   GERD (gastroesophageal reflux disease)    Hypercholesteremia    Hypertension    Tick bite    Vertigo    Wears dentures    partial upper    Patient Active Problem List   Diagnosis Date Noted   Atrophic vaginitis 06/22/2023   Constipation 06/22/2023   Elevated hemoglobin (HCC) 12/20/2022   Oral herpes 07/05/2022   Refused influenza vaccine 01/31/2022   History of colonic polyps    Polyp of descending  colon    Family history of colon cancer in mother 01/10/2021   Depression, major, single episode, mild (HCC) 01/09/2021   Generalized anxiety disorder 01/20/2019   Allergic rhinitis 10/17/2018   Carotid atherosclerosis, bilateral 07/16/2018   Vitamin D  deficiency 07/02/2018   Centrilobular emphysema (HCC) 03/21/2018   Obesity (BMI 30.0-34.9) 02/11/2018   Smoking history 05/15/2016   Elevated serum GGT level 05/15/2016   Spondylosis of cervical spine 07/08/2015   Varicose vein of leg 05/25/2015   Essential hypertension, benign 04/28/2015   Dyslipidemia 04/28/2015    Past Surgical History:  Procedure Laterality Date   ABDOMINAL HYSTERECTOMY     Partial; bladder suspension   COLONOSCOPY N/A 01/28/2021   Procedure: COLONOSCOPY;  Surgeon: Jinny Carmine, MD;  Location: [facility];  Service: Endoscopy;  Laterality: N/A;   COLONOSCOPY WITH PROPOFOL  N/A 05/10/2017   Procedure: COLONOSCOPY WITH PROPOFOL ;  Surgeon: Jinny Carmine, MD;  Location: [facility];  Service: Endoscopy;  Laterality: N/A;   HEMORRHOID SURGERY     POLYPECTOMY N/A 05/10/2017   Procedure: POLYPECTOMY;  Surgeon: Jinny Carmine, MD;  Location: [facility];  Service: Endoscopy;  Laterality: N/A;   POLYPECTOMY  01/28/2021   Procedure: POLYPECTOMY;  Surgeon: Jinny Carmine, MD;  Location: [facility];  Service: Endoscopy;;    OB History   No obstetric history on  file.      Home Medications    Prior to Admission medications   Medication Sig Start Date End Date Taking? Authorizing Provider  amLODipine  (NORVASC ) 5 MG tablet Take 2 tablets (10 mg total) by mouth daily. 12/25/23  Yes Cannady, [name] T, NP  aspirin 81 MG chewable tablet Chew 81 mg by mouth as needed.    Yes [provider]  busPIRone  (BUSPAR ) 5 MG tablet Take 0.5-1 tablets (2.5-5 mg total) by mouth 3 (three) times daily as needed. 12/25/23  Yes Cannady, [name] T, NP  ezetimibe  (ZETIA ) 10 MG tablet Take 1 tablet (10 mg total) by  mouth daily. 12/25/23  Yes Cannady, [name] T, NP  losartan  (COZAAR ) 100 MG tablet Take 1 tablet (100 mg total) by mouth daily. 10/16/23  Yes Cannady, [name] T, NP  rosuvastatin  (CRESTOR ) 40 MG tablet Take 1 tablet (40 mg total) by mouth daily. 12/25/23  Yes Cannady, [name] T, NP  traZODone  (DESYREL ) 50 MG tablet Take 0.5 tablets (25 mg total) by mouth at bedtime. 09/19/23  Yes Cannady, [name] T, NP  clobetasol  cream (TEMOVATE ) 0.05 % Apply 1 Application topically 2 (two) times daily. 10/23/22   Cannady, [name] T, NP  estradiol  (ESTRACE ) 0.1 MG/GM vaginal cream Place 1 applicator full vaginally daily at bedtime x 2 weeks and then reduce to one applicator vaginally at bedtime twice a week only. 06/22/23   Cannady, [name] T, NP  gabapentin  (NEURONTIN ) 300 MG capsule Take 1 capsule (300 mg total) by mouth at bedtime as needed. 12/25/23   Cannady, [name] T, NP  loratadine  (CLARITIN ) 10 MG tablet Take 1 tablet (10 mg total) by mouth daily as needed for allergies. 07/05/22   Cannady, [name] T, NP  Vitamin D , Ergocalciferol , 50000 units CAPS Take 1 capsule by mouth once a week. 12/25/23   Cannady, [name] T, NP    Family History Family History  Problem Relation Age of Onset   Cancer Mother        Colon   Hypertension Mother    Hypertension Father    Heart attack Father    Heart disease Father    Hypertension Sister    COPD Sister    Hypertension Brother    Heart disease Brother    Cancer Maternal Aunt    Breast cancer Maternal Aunt    Cancer Maternal Uncle    Breast cancer Maternal Aunt    Diabetes Neg Hx    Stroke Neg Hx     Social History Social History   Tobacco Use   Smoking status: Former    Current packs/day: 0.00    Average packs/day: 1 pack/day for 38.0 years (38.0 ttl pk-yrs)    Types: Cigarettes    Start date: 09/12/1977    Quit date: 09/13/2015    Years since quitting: 8.3   Smokeless tobacco: Never  Vaping Use   Vaping status: Former  Substance Use Topics   Alcohol use: Never     Comment: rare - holidays   Drug use: No     Allergies   Latex and Penicillins   Review of Systems Review of Systems :negative unless otherwise stated in HPI.      Physical Exam Triage Vital Signs ED Triage Vitals  Encounter Vitals Group     BP 01/14/24 1137 135/89     Girls Systolic BP Percentile --      Girls Diastolic BP Percentile --      Boys Systolic BP Percentile --  Boys Diastolic BP Percentile --      Pulse Rate 01/14/24 1137 80     Resp 01/14/24 1137 17     Temp 01/14/24 1137 99 F (37.2 C)     Temp Source 01/14/24 1137 Oral     SpO2 01/14/24 1137 97 %     Weight --      Height --      Head Circumference --      Peak Flow --      Pain Score 01/14/24 1136 0     Pain Loc --      Pain Education --      Exclude from Growth Chart --    No data found.  Updated Vital Signs BP 135/89 (BP Location: Left Arm)   Pulse 80   Temp 99 F (37.2 C) (Oral)   Resp 17   SpO2 97%   Visual Acuity Right Eye Distance:   Left Eye Distance:   Bilateral Distance:    Right Eye Near:   Left Eye Near:    Bilateral Near:     Physical Exam  GEN: alert, well appearing female, in no acute distress *** EYES: no scleral injection or discharge*** CV: regular rate and rhythm *** RESP: no increased work of breathing, clear to ascultation bilaterally*** MSK: no extremity edema *** NEURO: alert, moves all extremities appropriately SKIN: warm and dry; ***   ***pic  UC Treatments / Results  Labs (all labs ordered are listed, but only abnormal results are displayed) Labs Reviewed - No data to display  EKG   Radiology No results found.  Procedures Procedures (including critical care time)  Medications Ordered in UC Medications - No data to display  Initial Impression / Assessment and Plan / UC Course  I have reviewed the triage vital signs and the nursing notes.  Pertinent labs & imaging results that were available during my care of the patient were reviewed  by me and considered in my medical decision making (see chart for details).     Patient is a 63 y.o. femalewho presents for***.  Overall, patient is well-appearing and well-hydrated.  Vital signs stable.  Krislynn is afebrile.  Exam concerning for ***.  Treat with***steroid ointment. ***No sign of infection to suggest antibiotics or antifungals at this time.  ***Not likely viral exanthem.   Contact Dermatitis Patient is a 63 y.o. female who presents for ***worsening rash for the ***.  Overall, patient is well-appearing and well-hydrated.  Vital signs stable.  CAROLYN SYLVIA is ***afebrile.  History and exam concerning for ***contact dermatitis.  ***Decadron  10 mg IM given.  Treat with ***prednisone  taper and steroid ointment.  Claritin  or Zyrtec  twice a day for additional itch relief. No sign of infection to suggest antifungals or antibiotics at this time.    Reviewed expectations regarding course of current medical issues.  All questions asked were answered.  Outlined signs and symptoms indicating need for more acute intervention. Patient verbalized understanding. After Visit Summary given.   Final Clinical Impressions(s) / UC Diagnoses   Final diagnoses:  None   Discharge Instructions   None    ED Prescriptions   None    PDMP not reviewed this encounter.

## 2024-02-26 ENCOUNTER — Ambulatory Visit: Admitting: Emergency Medicine

## 2024-02-26 VITALS — Ht 62.0 in | Wt 163.0 lb

## 2024-02-26 DIAGNOSIS — Z1231 Encounter for screening mammogram for malignant neoplasm of breast: Secondary | ICD-10-CM

## 2024-02-26 DIAGNOSIS — Z0000 Encounter for general adult medical examination without abnormal findings: Secondary | ICD-10-CM | POA: Diagnosis not present

## 2024-02-26 NOTE — Patient Instructions (Signed)
 Allison Pham , Thank you for taking time out of your busy schedule to complete your Annual Wellness Visit with me. I enjoyed our conversation and look forward to speaking with you again next year. I, as well as your care team,  appreciate your ongoing commitment to your health goals. Please review the following plan we discussed and let me know if I can assist you in the future. Your Game plan/ To Do List    Referrals: None  Follow up Visits: We will see or speak with you next year for your Next Medicare AWV with our clinical staff Have you seen your provider in the last 6 months (3 months if uncontrolled diabetes)? Yes  Clinician Recommendations: I have included a list of eye doctor's in the area for you to schedule an appointment.  Aim for 30 minutes of exercise or brisk walking, 6-8 glasses of water , and 5 servings of fruits and vegetables each day.   Please call to schedule your mammogram:  [facility] Address: [address], [place], [place] Phone: [phone]  [facility] [address] [place], [place] [address] Phone: [phone]        This is a list of the screenings recommended for you:  Health Maintenance  Topic Date Due   Pneumococcal Vaccine for age over 22 (1 of 2 - PCV) Never done   Mammogram  02/12/2024   COVID-19 Vaccine (1 - 2024-25 season) Never done   Flu Shot  09/23/2024*   Screening for Lung Cancer  12/24/2024*   Medicare Annual Wellness Visit  02/25/2025   Pap with HPV screening  01/10/2026   Colon Cancer Screening  01/28/2026   DTaP/Tdap/Td vaccine (2 - Tdap) 01/11/2031   Hepatitis C Screening  Completed   HIV Screening  Completed   Zoster (Shingles) Vaccine  Completed   Hepatitis B Vaccine  Aged Out   HPV Vaccine  Aged Out   Meningitis B Vaccine  Aged Out  *Topic was postponed. The date shown is not the original due date.    Advanced directives: (ACP  Link)Information on Advanced Care Planning can be found at [place]  Secretary of [facility] Advance Health Care Directives Advance Health Care Directives. [email or web address]/ You may also get the forms at your doctor's office. Advance Care Planning is important because it:  [x]  Makes sure you receive the medical care that is consistent with your values, goals, and preferences  [x]  It provides guidance to your family and loved ones and reduces their decisional burden about whether or not they are making the right decisions based on your wishes.  Follow the link provided in your after visit summary or read over the paperwork we have mailed to you to help you started getting your Advance Directives in place. If you need assistance in completing these, please reach out to us  so that we can help you!  See attachments for Preventive Care and Fall Prevention Tips.   There are several Eye Doctors in your area. Here are a few that usually accept all insurance types:  [facility] [address] [place], [place] [address] Phone: [phone]  [facility] [address] [place], [place] [address] Phone: [phone]  [facility] [address] [place], [place] [address] Phone: [phone]  [facility] [address] [place], [place] [address] Phone: [phone]  [facility] [address] [place], [place] [address] Phone: [phone]  [facility]  [address] [place], [place] [address] Phone: [phone]  Please let us  know if you require a referral for an eye exam appointment. Thank you!     Fall Prevention in the Home, Adult Falls can cause injuries and affect people of all ages. There are many simple things that you can do to make your home safe and to help prevent falls. If you need it, ask for help making these changes. What actions can I take to prevent falls? General information Use good lighting in all rooms. Make sure to: Replace any light bulbs that burn  out. Turn on lights if it is dark and use night-lights. Keep items that you use often in easy-to-reach places. Lower the shelves around your home if needed. Move furniture so that there are clear paths around it. Do not keep throw rugs or other things on the floor that can make you trip. If any of your floors are uneven, fix them. Add color or contrast paint or tape to clearly mark and help you see: Grab bars or handrails. First and last steps of staircases. Where the edge of each step is. If you use a ladder or stepladder: Make sure that it is fully opened. Do not climb a closed ladder. Make sure the sides of the ladder are locked in place. Have someone hold the ladder while you use it. Know where your pets are as you move through your home. What can I do in the bathroom?     Keep the floor dry. Clean up any water  that is on the floor right away. Remove soap buildup in the bathtub or shower. Buildup makes bathtubs and showers slippery. Use non-skid mats or decals on the floor of the bathtub or shower. Attach bath mats securely with double-sided, non-slip rug tape. If you need to sit down while you are in the shower, use a non-slip stool. Install grab bars by the toilet and in the bathtub and shower. Do not use towel bars as grab bars. What can I do in the bedroom? Make sure that you have a light by your bed that is easy to reach. Do not use any sheets or blankets on your bed that hang to the floor. Have a firm bench or chair with side arms that you can use for support when you get dressed. What can I do in the kitchen? Clean up any spills right away. If you need to reach something above you, use a sturdy step stool that has a grab bar. Keep electrical cables out of the way. Do not use floor polish or wax that makes floors slippery. What can I do with my stairs? Do not leave anything on the stairs. Make sure that you have a light switch at the top and the bottom of the stairs.  Have them installed if you do not have them. Make sure that there are handrails on both sides of the stairs. Fix handrails that are broken or loose. Make sure that handrails are as long as the staircases. Install non-slip stair treads on all stairs in your home if they do not have carpet. Avoid having throw rugs at the top or bottom of stairs, or secure the rugs with carpet tape to prevent them from moving. Choose a carpet design that does not hide the edge of steps on the stairs. Make sure that carpet is firmly attached to the stairs. Fix any carpet that is loose or worn. What can I do on the outside  of my home? Use bright outdoor lighting. Repair the edges of walkways and driveways and fix any cracks. Clear paths of anything that can make you trip, such as tools or rocks. Add color or contrast paint or tape to clearly mark and help you see high doorway thresholds. Trim any bushes or trees on the main path into your home. Check that handrails are securely fastened and in good repair. Both sides of all steps should have handrails. Install guardrails along the edges of any raised decks or porches. Have leaves, snow, and ice cleared regularly. Use sand, salt, or ice melt on walkways during winter months if you live where there is ice and snow. In the garage, clean up any spills right away, including grease or oil spills. What other actions can I take? Review your medicines with your health care provider. Some medicines can make you confused or feel dizzy. This can increase your chance of falling. Wear closed-toe shoes that fit well and support your feet. Wear shoes that have rubber soles and low heels. Use a cane, walker, scooter, or crutches that help you move around if needed. Talk with your provider about other ways that you can decrease your risk of falls. This may include seeing a physical therapist to learn to do exercises to improve movement and strength. Where to find more  information Centers for Disease Control and Prevention, STEADI: TonerPromos.no [employer] on Aging: BaseRingTones.pl National Institute on Aging: BaseRingTones.pl Contact a health care provider if: You are afraid of falling at home. You feel weak, drowsy, or dizzy at home. You fall at home. Get help right away if you: Lose consciousness or have trouble moving after a fall. Have a fall that causes a head injury. These symptoms may be an emergency. Get help right away. Call 911. Do not wait to see if the symptoms will go away. Do not drive yourself to the hospital. This information is not intended to replace advice given to you by your health care provider. Make sure you discuss any questions you have with your health care provider. Document Revised: 02/13/2022 Document Reviewed: 02/13/2022 Elsevier Patient Education  2024 [employer].

## 2024-02-26 NOTE — Progress Notes (Signed)
 Subjective:   Allison Pham is a 63 y.o. who presents for a Medicare Wellness preventive visit.  As a reminder, Annual Wellness Visits don't include a physical exam, and some assessments may be limited, especially if this visit is performed virtually. We may recommend an in-person follow-up visit with your provider if needed.  Visit Complete: Virtual I connected with  Rock JINNY Blush on 02/26/24 by a audio enabled telemedicine application and verified that I am speaking with the correct person using two identifiers.  Patient Location: Home  Provider Location: Office/Clinic  I discussed the limitations of evaluation and management by telemedicine. The patient expressed understanding and agreed to proceed.  Vital Signs: Because this visit was a virtual/telehealth visit, some criteria may be missing or patient reported. Any vitals not documented were not able to be obtained and vitals that have been documented are patient reported.  VideoDeclined- This patient declined Librarian, academic. Therefore the visit was completed with audio only.  Persons Participating in Visit: Patient.  AWV Questionnaire: No: Patient Medicare AWV questionnaire was not completed prior to this visit.  Cardiac Risk Factors include: dyslipidemia;hypertension     Objective:    Today's Vitals   02/26/24 1603  Weight: 163 lb (73.9 kg)  Height: 5' 2 (1.575 m)   Body mass index is 29.81 kg/m.     02/26/2024    4:19 PM 01/14/2024   11:37 AM 10/04/2021    3:26 PM 01/28/2021    7:55 AM 05/10/2017    8:27 AM 03/21/2017    9:57 AM 05/25/2016    9:59 AM  Advanced Directives  Does Patient Have a Medical Advance Directive? No No No No Yes  No  No   Type of Psychiatric nurse of Healthcare Power of Attorney in Chart?     No - copy requested     Would patient like information on creating a medical advance directive? Yes (MAU/Ambulatory/Procedural  Areas - Information given) No - Patient declined  No - Patient declined        Data saved with a previous flowsheet row definition    Current Medications (verified) Outpatient Encounter Medications as of 02/26/2024  Medication Sig   amLODipine  (NORVASC ) 5 MG tablet Take 2 tablets (10 mg total) by mouth daily.   aspirin 81 MG chewable tablet Chew 81 mg by mouth as needed.    busPIRone  (BUSPAR ) 5 MG tablet Take 0.5-1 tablets (2.5-5 mg total) by mouth 3 (three) times daily as needed.   estradiol  (ESTRACE ) 0.1 MG/GM vaginal cream Place 1 applicator full vaginally daily at bedtime x 2 weeks and then reduce to one applicator vaginally at bedtime twice a week only.   ezetimibe  (ZETIA ) 10 MG tablet Take 1 tablet (10 mg total) by mouth daily.   gabapentin  (NEURONTIN ) 300 MG capsule Take 1 capsule (300 mg total) by mouth at bedtime as needed.   losartan  (COZAAR ) 100 MG tablet Take 1 tablet (100 mg total) by mouth daily.   rosuvastatin  (CRESTOR ) 40 MG tablet Take 1 tablet (40 mg total) by mouth daily.   traZODone  (DESYREL ) 50 MG tablet Take 0.5 tablets (25 mg total) by mouth at bedtime. (Patient taking differently: Take 25 mg by mouth at bedtime. PRN)   Vitamin D , Ergocalciferol , 50000 units CAPS Take 1 capsule by mouth once a week.   clobetasol  cream (TEMOVATE ) 0.05 % Apply 1 Application topically 2 (two) times daily. (Patient  not taking: Reported on 02/26/2024)   loratadine  (CLARITIN ) 10 MG tablet Take 1 tablet (10 mg total) by mouth daily as needed for allergies. (Patient not taking: Reported on 02/26/2024)   predniSONE  (STERAPRED UNI-PAK 21 TAB) 10 MG (21) TBPK tablet Take by mouth daily. Take 6 tabs by mouth daily for 1, then 5 tabs for 1 day, then 4 tabs for 1 day, then 3 tabs for 1 day, then 2 tabs for 1 day, then 1 tab for 1 day. (Patient not taking: Reported on 02/26/2024)   triamcinolone  ointment (KENALOG ) 0.1 % Apply 1 Application topically 2 (two) times daily. (Patient not taking: Reported on 02/26/2024)    No facility-administered encounter medications on file as of 02/26/2024.    Allergies (verified) Latex and Penicillins   History: Past Medical History:  Diagnosis Date   Anxiety    Carotid atherosclerosis, bilateral 07/16/2018   rescan Jan 2021   Emphysema of lung (HCC) 03/21/2018   GERD (gastroesophageal reflux disease)    Hypercholesteremia    Hypertension    Tick bite    Vertigo    Wears dentures    partial upper   Past Surgical History:  Procedure Laterality Date   ABDOMINAL HYSTERECTOMY     Partial; bladder suspension   COLONOSCOPY N/A 01/28/2021   Procedure: COLONOSCOPY;  Surgeon: Jinny Carmine, MD;  Location: [facility];  Service: Endoscopy;  Laterality: N/A;   COLONOSCOPY WITH PROPOFOL  N/A 05/10/2017   Procedure: COLONOSCOPY WITH PROPOFOL ;  Surgeon: Jinny Carmine, MD;  Location: [facility];  Service: Endoscopy;  Laterality: N/A;   HEMORRHOID SURGERY     POLYPECTOMY N/A 05/10/2017   Procedure: POLYPECTOMY;  Surgeon: Jinny Carmine, MD;  Location: [facility];  Service: Endoscopy;  Laterality: N/A;   POLYPECTOMY  01/28/2021   Procedure: POLYPECTOMY;  Surgeon: Jinny Carmine, MD;  Location: [facility];  Service: Endoscopy;;   Family History  Problem Relation Age of Onset   Cancer Mother        Colon   Hypertension Mother    Hypertension Father    Heart attack Father    Heart disease Father    Hypertension Sister    COPD Sister    Hypertension Brother    Heart disease Brother    Cancer Maternal Aunt    Breast cancer Maternal Aunt    Cancer Maternal Uncle    Breast cancer Maternal Aunt    Diabetes Neg Hx    Stroke Neg Hx    Social History   Socioeconomic History   Marital status: Divorced    Spouse name: Not on file   Number of children: 3   Years of education: Not on file   Highest education level: Not on file  Occupational History   Not on file  Tobacco Use   Smoking status: Former    Current packs/day: 0.00    Average  packs/day: 1 pack/day for 38.0 years (38.0 ttl pk-yrs)    Types: Cigarettes    Start date: 09/12/1977    Quit date: 09/13/2015    Years since quitting: 8.4   Smokeless tobacco: Never  Vaping Use   Vaping status: Never Used  Substance and Sexual Activity   Alcohol use: Never    Comment: rare - holidays   Drug use: No   Sexual activity: Not Currently    Birth control/protection: Surgical  Other Topics Concern   Not on file  Social History Narrative   Not on file   Social Drivers of  Health   Financial Resource Strain: Low Risk  (02/26/2024)   Overall Financial Resource Strain (CARDIA)    Difficulty of Paying Living Expenses: Not hard at all  Food Insecurity: No Food Insecurity (02/26/2024)   Hunger Vital Sign    Worried About Running Out of Food in the Last Year: Never true    Ran Out of Food in the Last Year: Never true  Transportation Needs: No Transportation Needs (02/26/2024)   PRAPARE - Administrator, Civil Service (Medical): No    Lack of Transportation (Non-Medical): No  Physical Activity: Sufficiently Active (02/26/2024)   Exercise Vital Sign    Days of Exercise per Week: 5 days    Minutes of Exercise per Session: 30 min  Stress: No Stress Concern Present (02/26/2024)   [employer] of Occupational Health - Occupational Stress Questionnaire    Feeling of Stress: Only a little  Social Connections: Socially Isolated (02/26/2024)   Social Connection and Isolation Panel    Frequency of Communication with Friends and Family: More than three times a week    Frequency of Social Gatherings with Friends and Family: More than three times a week    Attends Religious Services: Never    Database administrator or Organizations: No    Attends Engineer, structural: Never    Marital Status: Divorced    Tobacco Counseling Counseling given: Not Answered    Clinical Intake:  Pre-visit preparation completed: Yes  Pain : No/denies pain     BMI - recorded:  29.81 Nutritional Status: BMI 25 -29 Overweight Nutritional Risks: None Diabetes: No  No results found for: HGBA1C   How often do you need to have someone help you when you read instructions, pamphlets, or other written materials from your doctor or pharmacy?: 3 - Sometimes (daughter will help with reading)  Interpreter Needed?: No  Information entered by :: Vina Ned, CMA   Activities of Daily Living     02/26/2024    4:07 PM  In your present state of health, do you have any difficulty performing the following activities:  Hearing? 0  Vision? 1  Comment glasses are broken  Difficulty concentrating or making decisions? 0  Walking or climbing stairs? 1  Comment knees bother me and varicose veins  Dressing or bathing? 0  Doing errands, shopping? 0  Preparing Food and eating ? N  Using the Toilet? N  In the past six months, have you accidently leaked urine? N  Do you have problems with loss of bowel control? N  Managing your Medications? N  Managing your Finances? N  Housekeeping or managing your Housekeeping? N    Patient Care Team: Cannady, [name] T, NP as PCP - General (Nurse Practitioner)  I have updated your Care Teams any recent Medical Services you may have received from other providers in the past year.     Assessment:   This is a routine wellness examination for [place].  Hearing/Vision screen Hearing Screening - Comments:: Denies hearing loss  Vision Screening - Comments:: Needs routine eye exams, included list of eye doctors in AVS   Goals Addressed             This Visit's Progress    Patient Stated       Be more social       Depression Screen     02/26/2024    4:15 PM 12/25/2023   10:46 AM 06/22/2023   10:59 AM 03/27/2023   11:21 AM  12/19/2022   11:22 AM 10/23/2022    3:00 PM 05/03/2022   10:20 AM  PHQ 2/9 Scores  PHQ - 2 Score 1 2 3 3 2 3 2   PHQ- 9 Score 3 9 9 12 5 10 9     Fall Risk     02/26/2024    4:21 PM 12/25/2023   10:46 AM  06/22/2023   10:58 AM 03/27/2023   11:21 AM 01/31/2022   10:03 AM  Fall Risk   Falls in the past year? 0 0 0 0 0  Number falls in past yr: 0 0 0 0 0  Injury with Fall? 0 0 0 0 0  Risk for fall due to : No Fall Risks No Fall Risks No Fall Risks No Fall Risks No Fall Risks  Follow up Falls evaluation completed Falls evaluation completed Falls evaluation completed Falls evaluation completed Falls evaluation completed      Data saved with a previous flowsheet row definition    MEDICARE RISK AT HOME:  Medicare Risk at Home Any stairs in or around the home?: Yes If so, are there any without handrails?: No Home free of loose throw rugs in walkways, pet beds, electrical cords, etc?: Yes Adequate lighting in your home to reduce risk of falls?: Yes Life alert?: Yes Use of a cane, walker or w/c?: No Grab bars in the bathroom?: No Shower chair or bench in shower?: No Elevated toilet seat or a handicapped toilet?: No  TIMED UP AND GO:  Was the test performed?  No  Cognitive Function: 6CIT completed        02/26/2024    4:22 PM 12/19/2022   11:33 AM  6CIT Screen  What Year? 0 points 0 points  What month? 0 points 0 points  What time? 0 points 0 points  Count back from 20 0 points 0 points  Months in reverse 0 points 0 points  Repeat phrase 0 points 0 points  Total Score 0 points 0 points    Immunizations Immunization History  Administered Date(s) Administered   Td 01/10/2021   Zoster Recombinant(Shingrix ) 01/09/2020, 04/13/2020    Screening Tests Health Maintenance  Topic Date Due   Pneumococcal Vaccine: 50+ Years (1 of 2 - PCV) Never done   MAMMOGRAM  02/12/2024   COVID-19 Vaccine (1 - 2024-25 season) Never done   INFLUENZA VACCINE  09/23/2024 (Originally 01/25/2024)   Lung Cancer Screening  12/24/2024 (Originally 03/15/2019)   Medicare Annual Wellness (AWV)  02/25/2025   Cervical Cancer Screening (HPV/Pap Cotest)  01/10/2026   Colonoscopy  01/28/2026   DTaP/Tdap/Td (2 -  Tdap) 01/11/2031   Hepatitis C Screening  Completed   HIV Screening  Completed   Zoster Vaccines- Shingrix   Completed   Hepatitis B Vaccines 19-59 Average Risk  Aged Out   HPV VACCINES  Aged Out   Meningococcal B Vaccine  Aged Out    Health Maintenance  Health Maintenance Due  Topic Date Due   Pneumococcal Vaccine: 50+ Years (1 of 2 - PCV) Never done   MAMMOGRAM  02/12/2024   COVID-19 Vaccine (1 - 2024-25 season) Never done   Health Maintenance Items Addressed: Mammogram ordered, See Nurse Notes at the end of this note  Additional Screening:  Vision Screening: Recommended annual ophthalmology exams for early detection of glaucoma and other disorders of the eye. Would you like a referral to an eye doctor? No    Dental Screening: Recommended annual dental exams for proper oral hygiene  Community Resource Referral /  Chronic Care Management: CRR required this visit?  No   CCM required this visit?  No   Plan:    I have personally reviewed and noted the following in the patient's chart:   Medical and social history Use of alcohol, tobacco or illicit drugs  Current medications and supplements including opioid prescriptions. Patient is not currently taking opioid prescriptions. Functional ability and status Nutritional status Physical activity Advanced directives List of other physicians Hospitalizations, surgeries, and ER visits in previous 12 months Vitals Screenings to include cognitive, depression, and falls Referrals and appointments  In addition, I have reviewed and discussed with patient certain preventive protocols, quality metrics, and best practice recommendations. A written personalized care plan for preventive services as well as general preventive health recommendations were provided to patient.   Vina Ned, CMA   02/26/2024   After Visit Summary: (MyChart) Due to this being a telephonic visit, the after visit summary with patients personalized plan was  offered to patient via MyChart   Notes:  Placed order for MMG (past due since 02/12/24) Needs routine eye exam. Included a list of eye doctors in AVS Declined lung cancer screening Declined flu, covid, and pneumonia vaccines

## 2024-04-11 NOTE — Telephone Encounter (Signed)
 Copied from CRM #[id number]. Topic: Clinical - Medication Question >> Apr 11, 2024  2:02 PM Willma R wrote: Reason for CRM: Patient is leaving for a cruise on 04/21/24. Is requesting to see if there is anyway to be prescribed a patch for sea sickness. Also wants to make sure her insurance will cover it.  Patient can be reached at [phone] This encounter was created in error - please disregard.

## 2024-04-14 ENCOUNTER — Telehealth: Payer: Self-pay | Admitting: Nurse Practitioner

## 2024-04-14 NOTE — Telephone Encounter (Signed)
 Patient came by office requesting if provider will call in a Travel Patch. Patient is traveling on small cruise 04-25-24. Patient's pharmacy is [facility]. Please advise [phone].

## 2024-04-15 MED ORDER — SCOPOLAMINE 1 MG/3DAYS TD PT72: 1.0000 | MEDICATED_PATCH | TRANSDERMAL | 0 refills | Status: DC

## 2024-04-15 NOTE — Telephone Encounter (Signed)
 Forwarding to PCP.

## 2024-04-17 NOTE — Telephone Encounter (Signed)
 Ok for E2C2 to review.  Please advise patient rx is available at pharmacy, if she has not already picked up.

## 2024-05-06 ENCOUNTER — Ambulatory Visit: Admitting: Nurse Practitioner

## 2024-05-06 ENCOUNTER — Ambulatory Visit: Payer: Self-pay | Admitting: *Deleted

## 2024-05-06 NOTE — Telephone Encounter (Signed)
 FYI Only or Action Required?: FYI only for provider: appointment scheduled on 11/14.  Patient was last seen in primary care on 12/25/2023 by Cannady, [name] T, NP.  Called Nurse Triage reporting Otalgia.  Symptoms began several days ago.  Interventions attempted: Ice/heat application.  Symptoms are: gradually worsening.  Triage Disposition: See Physician Within 24 Hours  Patient/caregiver understands and will follow disposition?: Yes   Copied from CRM #[id number]. Topic: Clinical - Red Word Triage >> May 06, 2024  1:52 PM Shanda MATSU wrote: Red Word that prompted transfer to Nurse Triage: Patient is reporting pain in her left ear. Reason for Disposition  Earache  (Exceptions: Brief ear pain of lasting less than 60 minutes, or earache occurring during air travel.)  Answer Assessment - Initial Assessment Questions 1. LOCATION: Which ear is involved?     Left ear 2. ONSET: When did the ear pain start?      3-4 days ago 3. SEVERITY: How bad is the pain?  (Scale 1-10; mild, moderate or severe)     4-5/10 4. URI SYMPTOMS: Do you have a runny nose or cough?     Allergy only 5. FEVER: Do you have a fever? If Yes, ask: What is your temperature, how was it measured, and when did it start?     no 6. CAUSE: Have you been swimming recently?, How often do you use Q-TIPS?, Have you had any recent air travel or scuba diving?     Recent cruise 7. OTHER SYMPTOMS: Do you have any other symptoms? (e.g., decreased hearing, dizziness, headache, stiff neck, vomiting)     no  Protocols used: Earache-A-AH

## 2024-05-06 NOTE — Telephone Encounter (Signed)
 Noted, scheduled 05/08/24.

## 2024-05-08 ENCOUNTER — Ambulatory Visit: Admitting: Nurse Practitioner

## 2024-05-08 ENCOUNTER — Encounter: Payer: Self-pay | Admitting: Nurse Practitioner

## 2024-05-08 VITALS — BP 92/64 | HR 69 | Temp 98.4°F | Resp 18 | Ht 62.01 in | Wt 169.0 lb

## 2024-05-08 DIAGNOSIS — H6501 Acute serous otitis media, right ear: Secondary | ICD-10-CM | POA: Diagnosis not present

## 2024-05-08 DIAGNOSIS — H6691 Otitis media, unspecified, right ear: Secondary | ICD-10-CM | POA: Insufficient documentation

## 2024-05-08 DIAGNOSIS — H3530 Unspecified macular degeneration: Secondary | ICD-10-CM | POA: Insufficient documentation

## 2024-05-08 MED ORDER — DOXYCYCLINE HYCLATE 100 MG PO TABS: 100.0000 mg | ORAL_TABLET | Freq: Two times a day (BID) | ORAL | 0 refills | Status: AC

## 2024-05-08 NOTE — Assessment & Plan Note (Signed)
 Acute, after recent acute illness while traveling on cruise ship. Is allergic to Penicillin. Will start Doxycycline  100 MG BID. Recommend she take this with probiotic yogurt to help with any GI symptoms. No Q tips or going swimming until symptoms improved. Take Tylenol  as needed for pain.

## 2024-05-08 NOTE — Patient Instructions (Signed)

## 2024-05-08 NOTE — Progress Notes (Signed)
 BP 92/64 (BP Location: Left Arm, Patient Position: Sitting, Cuff Size: Normal)   Pulse 69   Temp 98.4 F (36.9 C) (Oral)   Resp 18   Ht 5' 2.01 (1.575 m)   Wt 169 lb (76.7 kg)   SpO2 94%   BMI 30.90 kg/m    Subjective:    Patient ID: Allison Pham, female    DOB: 1960-11-15, 63 y.o.   MRN: [id number]  HPI: Allison Pham is a 63 y.o. female  Chief Complaint  Patient presents with   Ear Pain    Started once she used scopolamine patch and has been hurting since.    EAR PAIN Went on cruise 2 weeks ago, returned on October 31st. Used Scopolamine patches on cruise.  Left ear pain started 2 days ago. Duration: days Involved ear(s): left Severity:  4/10  Quality:  dull, aching, pressure-like, and throbbing Fever: no Otorrhea: no Upper respiratory infection symptoms: yes initially Pruritus: no Hearing loss: no Water  immersion no Using Q-tips: very little Recurrent otitis media: no Status: stable Treatments attempted: ear drops from drugstore   Relevant past medical, surgical, family and social history reviewed and updated as indicated. Interim medical history since our last visit reviewed. Allergies and medications reviewed and updated.  Review of Systems  Constitutional:  Negative for activity change, appetite change, diaphoresis, fatigue and fever.  HENT:  Positive for ear pain. Negative for ear discharge.   Respiratory:  Negative for cough, chest tightness, shortness of breath and wheezing.   Cardiovascular:  Negative for chest pain, palpitations and leg swelling.  Gastrointestinal: Negative.   Neurological: Negative.   Psychiatric/Behavioral: Negative.      Per HPI unless specifically indicated above     Objective:    BP 92/64 (BP Location: Left Arm, Patient Position: Sitting, Cuff Size: Normal)   Pulse 69   Temp 98.4 F (36.9 C) (Oral)   Resp 18   Ht 5' 2.01 (1.575 m)   Wt 169 lb (76.7 kg)   SpO2 94%   BMI 30.90 kg/m   Wt Readings from Last 3  Encounters:  05/08/24 169 lb (76.7 kg)  02/26/24 163 lb (73.9 kg)  12/25/23 173 lb 3.2 oz (78.6 kg)    Physical Exam Vitals and nursing note reviewed.  Constitutional:      General: She is awake. She is not in acute distress.    Appearance: She is well-developed and well-groomed. She is obese. She is not ill-appearing or toxic-appearing.  HENT:     Head: Normocephalic.     Right Ear: Hearing, ear canal and external ear normal. A middle ear effusion is present. There is no impacted cerumen. Tympanic membrane is not injected or perforated.     Left Ear: Hearing, ear canal and external ear normal. A middle ear effusion is present. There is no impacted cerumen. Tympanic membrane is injected. Tympanic membrane is not perforated.     Nose: No rhinorrhea.     Right Sinus: No maxillary sinus tenderness or frontal sinus tenderness.     Left Sinus: No maxillary sinus tenderness or frontal sinus tenderness.     Mouth/Throat:     Mouth: Mucous membranes are moist.     Pharynx: Posterior oropharyngeal erythema (mild) and postnasal drip present. No pharyngeal swelling or oropharyngeal exudate.  Eyes:     General: Lids are normal.        Right eye: No discharge.        Left eye: No discharge.  Conjunctiva/sclera: Conjunctivae normal.     Pupils: Pupils are equal, round, and reactive to light.  Neck:     Thyroid : No thyromegaly.     Vascular: No carotid bruit.  Cardiovascular:     Rate and Rhythm: Normal rate and regular rhythm.     Heart sounds: Normal heart sounds. No murmur heard.    No gallop.  Pulmonary:     Effort: Pulmonary effort is normal. No accessory muscle usage or respiratory distress.     Breath sounds: Normal breath sounds. No decreased breath sounds, wheezing or rales.  Abdominal:     General: Bowel sounds are normal.     Palpations: Abdomen is soft. There is no hepatomegaly or splenomegaly.  Musculoskeletal:     Cervical back: Normal range of motion and neck supple.      Right lower leg: No edema.     Left lower leg: No edema.  Lymphadenopathy:     Head:     Right side of head: No submental, submandibular, tonsillar, preauricular or posterior auricular adenopathy.     Left side of head: No submental, submandibular, tonsillar, preauricular or posterior auricular adenopathy.     Cervical: No cervical adenopathy.  Skin:    General: Skin is warm and dry.  Neurological:     Mental Status: She is alert and oriented to person, place, and time.  Psychiatric:        Attention and Perception: Attention normal.        Mood and Affect: Mood normal.        Speech: Speech normal.        Behavior: Behavior normal. Behavior is cooperative.        Thought Content: Thought content normal.     Results for orders placed or performed in visit on 12/25/23  Comprehensive metabolic panel with GFR   Collection Time: 12/25/23 11:03 AM  Result Value Ref Range   Glucose 98 70 - 99 mg/dL   BUN 18 8 - 27 mg/dL   Creatinine, Ser 9.03 0.57 - 1.00 mg/dL   eGFR 66 >40 fO/fpw/8.26   BUN/Creatinine Ratio 19 12 - 28   Sodium 141 134 - 144 mmol/L   Potassium 4.0 3.5 - 5.2 mmol/L   Chloride 105 96 - 106 mmol/L   CO2 20 20 - 29 mmol/L   Calcium  9.5 8.7 - 10.3 mg/dL   Total Protein 6.7 6.0 - 8.5 g/dL   Albumin 4.2 3.9 - 4.9 g/dL   Globulin, Total 2.5 1.5 - 4.5 g/dL   Bilirubin Total 0.5 0.0 - 1.2 mg/dL   Alkaline Phosphatase 129 (H) 44 - 121 IU/L   AST 23 0 - 40 IU/L   ALT 28 0 - 32 IU/L  Lipid Panel w/o Chol/HDL Ratio   Collection Time: 12/25/23 11:03 AM  Result Value Ref Range   Cholesterol, Total 163 100 - 199 mg/dL   Triglycerides 844 (H) 0 - 149 mg/dL   HDL 38 (L) >60 mg/dL   VLDL Cholesterol Cal 27 5 - 40 mg/dL   LDL Chol Calc (NIH) 98 0 - 99 mg/dL  TSH   Collection Time: 12/25/23 11:03 AM  Result Value Ref Range   TSH 2.180 0.450 - 4.500 uIU/mL  VITAMIN D  25 Hydroxy (Vit-D Deficiency, Fractures)   Collection Time: 12/25/23 11:03 AM  Result Value Ref Range   Vit  D, 25-Hydroxy 84.0 30.0 - 100.0 ng/mL  Gamma GT   Collection Time: 12/25/23 11:03 AM  Result Value Ref Range  GGT 188 (H) 0 - 60 IU/L  CBC (STAT)   Collection Time: 12/25/23 11:13 AM  Result Value Ref Range   WBC 9.3 3.4 - 10.8 x10E3/uL   RBC 5.54 (H) 3.77 - 5.28 x10E6/uL   Hemoglobin 16.3 (H) 11.1 - 15.9 g/dL   Hematocrit 53.0 (H) 65.9 - 46.6 %   MCV 85 79 - 97 fL   MCH 29.4 26.6 - 33.0 pg   MCHC 34.8 31.5 - 35.7 g/dL   RDW 85.6 88.2 - 84.5 %   Platelets 123 (L) 150 - 450 x10E3/uL      Assessment & Plan:   Problem List Items Addressed This Visit       Nervous and Auditory   Otitis media, right - Primary   Acute, after recent acute illness while traveling on cruise ship. Is allergic to Penicillin. Will start Doxycycline  100 MG BID. Recommend she take this with probiotic yogurt to help with any GI symptoms. No Q tips or going swimming until symptoms improved. Take Tylenol  as needed for pain.      Relevant Medications   doxycycline  (VIBRA -TABS) 100 MG tablet     Follow up plan: Return for as scheduled in January.

## 2024-05-15 ENCOUNTER — Telehealth: Payer: Self-pay | Admitting: Nurse Practitioner

## 2024-05-15 NOTE — Telephone Encounter (Signed)
 Copied from CRM #[id number]. Topic: Clinical - Red Word Triage >> May 15, 2024  1:51 PM Allison Pham wrote: Patient called.. said finished her meds from ear infection.. but it is still there: ear pressure and a little pain .Allison Pham Pls call and advise.. she is having surgery 12/4 for her eye and opening to get it resolved Pls leave a msg if you can't get her

## 2024-05-16 ENCOUNTER — Ambulatory Visit: Payer: Self-pay

## 2024-05-16 MED ORDER — PREDNISONE 20 MG PO TABS: 20.0000 mg | ORAL_TABLET | Freq: Every day | ORAL | 0 refills | Status: AC

## 2024-05-16 NOTE — Telephone Encounter (Signed)
 FYI Only or Action Required?: Action required by provider: update on patient condition. PT would like to try low dose prednisone  for ear infection  Patient was last seen in primary care on 05/08/2024 by Valerio Melanie DASEN, NP.  Called Nurse Triage reporting Ear Fullness.  Symptoms began a week ago.  Interventions attempted: OTC medications: cetirizine  and Prescription medications: doxycycline .  Symptoms are: gradually worsening.  Triage Disposition: See Physician Within 24 Hours  Patient/caregiver understands and will follow disposition?: Yes  Copied from CRM #[id number]. Topic: Clinical - Red Word Triage >> May 16, 2024  1:22 PM Gustabo D wrote: Pt has a ear infection and says she was given meds for it but it still hurts. She has to have surgery Dec 4,2025 and can't get it with a ear infection. She says the medication didn't work this is the left ear. Reason for Disposition  [1] Taking antibiotic > 72 hours (3 days) AND [2] sinus pain not improved  Answer Assessment - Initial Assessment Questions Completed Doxy yesterday- for ear infection- pain lessened while on ABX but did not resolve. Coming back- today 4/10. Taking cetirizine  daily. BP was elevated with initial infection. Just got it back regulated.   Willing to try the low dose prednisone  but concerned about her BP and having to take extra medication if it gets high again. Pharmacy confirmed. Please call when medication sent in to pharmacy. Thanks!  1. ANTIBIOTIC: What antibiotic are you taking? How many times a day?     Doxycycline  100mg  BID 2. ONSET: When was the antibiotic started?     05/03/24 3. PAIN: How bad is the pain?   (Scale 0-10; or none, mild, moderate or severe)     4/10- worse today  4. FEVER: Do you have a fever? If Yes, ask: What is it, how was it measured, and when did it start?      denies 5. SYMPTOMS: Are there any other symptoms you're concerned about? If Yes, ask: When did it start?      Ear pain  Protocols used: Sinus Infection on Antibiotic Follow-up Call-A-[facility]

## 2024-05-16 NOTE — Telephone Encounter (Signed)
 Tried calling patient, no answer and no VM.   OK for E2C2 to speak to patient and notify her of [name]'s message if she calls back.

## 2024-05-16 NOTE — Addendum Note (Signed)
 Addended by: Chace Bisch T on: 05/16/2024 02:16 PM   Modules accepted: Orders

## 2024-05-16 NOTE — Telephone Encounter (Signed)
 Routing to provider to advise.

## 2024-05-19 NOTE — Telephone Encounter (Signed)
 Patient has been called and a message left for them to return the call to the office. Ok for E2C2 to review if/when they return the call. Please do not transfer to CAL rather send a CRM if needed only.  If patient is still not feeling well please assist with scheduling an appointment for Allison Pham. Advise that she should if not begin using the different options provider mentions in Allison Pham message. If she prefers not to schedule and start the prednisone  she could also try that and also just need a CRM sent to office.

## 2024-05-20 ENCOUNTER — Encounter: Payer: Self-pay | Admitting: Ophthalmology

## 2024-05-26 NOTE — Discharge Instructions (Signed)

## 2024-05-29 ENCOUNTER — Ambulatory Visit: Payer: Self-pay | Admitting: Anesthesiology

## 2024-05-29 ENCOUNTER — Encounter: Payer: Self-pay | Admitting: Ophthalmology

## 2024-05-29 ENCOUNTER — Ambulatory Visit
Admission: RE | Admit: 2024-05-29 | Discharge: 2024-05-29 | Disposition: A | Discharge status: Home / Self Care | Attending: Ophthalmology | Admitting: Ophthalmology

## 2024-05-29 ENCOUNTER — Encounter
Admission: RE | Disposition: A | Discharge status: Home / Self Care | Payer: Self-pay | Source: Home / Self Care | Attending: Ophthalmology

## 2024-05-29 ENCOUNTER — Other Ambulatory Visit: Payer: Self-pay

## 2024-05-29 HISTORY — PX: CATARACT EXTRACTION W/PHACO: SHX586

## 2024-05-29 HISTORY — PX: ANTERIOR VITRECTOMY: SHX1173

## 2024-05-29 SURGERY — PHACOEMULSIFICATION, CATARACT, WITH IOL INSERTION
Anesthesia: Topical | Site: Eye | Laterality: Left

## 2024-05-29 MED ORDER — HYDROCODONE-ACETAMINOPHEN 5-325 MG PO TABS: 1.0000 | ORAL_TABLET | ORAL | Status: DC | PRN

## 2024-05-29 MED ORDER — PHENYLEPHRINE HCL 10 % OP SOLN
OPHTHALMIC | Status: AC
  Filled 2024-05-29: qty 5

## 2024-05-29 MED ORDER — FENTANYL CITRATE (PF) 100 MCG/2ML IJ SOLN
INTRAMUSCULAR | Status: AC
  Filled 2024-05-29: qty 2

## 2024-05-29 MED ORDER — SIGHTPATH DOSE#1 NA CHONDROIT SULF-NA HYALURON 20-15 MG/0.5ML IO SOSY
INTRAOCULAR | Status: DC | PRN
  Administered 2024-05-29: .5 mL via INTRAOCULAR

## 2024-05-29 MED ORDER — TRIAMCINOLONE ACETONIDE 40 MG/ML IJ SUSP
INTRAMUSCULAR | Status: DC | PRN
  Administered 2024-05-29: 40 mg

## 2024-05-29 MED ORDER — OXYCODONE HCL 5 MG PO TABS: 10.0000 mg | ORAL_TABLET | Freq: Once | ORAL | Status: DC

## 2024-05-29 MED ORDER — MIDAZOLAM HCL (PF) 2 MG/2ML IJ SOLN
INTRAMUSCULAR | Status: DC | PRN
  Administered 2024-05-29 (×2): 1 mg via INTRAVENOUS

## 2024-05-29 MED ORDER — CYCLOPENTOLATE HCL 2 % OP SOLN
OPHTHALMIC | Status: AC
  Filled 2024-05-29: qty 2

## 2024-05-29 MED ORDER — FENTANYL CITRATE (PF) 100 MCG/2ML IJ SOLN
INTRAMUSCULAR | Status: DC | PRN
  Administered 2024-05-29: 50 ug via INTRAVENOUS

## 2024-05-29 MED ORDER — NA CHONDROIT SULF-NA HYALURON 40-30 MG/ML IO SOSY
INTRAOCULAR | Status: DC | PRN
  Administered 2024-05-29: .5 mL via INTRAOCULAR

## 2024-05-29 MED ORDER — TETRACAINE HCL 0.5 % OP SOLN
1.0000 [drp] | OPHTHALMIC | Status: DC | PRN
  Administered 2024-05-29 (×3): 1 [drp] via OPHTHALMIC

## 2024-05-29 MED ORDER — ONDANSETRON HCL 4 MG/2ML IJ SOLN
4.0000 mg | Freq: Once | INTRAMUSCULAR | Status: AC
  Administered 2024-05-29: 4 mg via INTRAVENOUS

## 2024-05-29 MED ORDER — ACETAZOLAMIDE ER 500 MG PO CP12
ORAL_CAPSULE | ORAL | Status: AC
  Filled 2024-05-29: qty 1

## 2024-05-29 MED ORDER — SIGHTPATH DOSE#1 BSS IO SOLN
INTRAOCULAR | Status: DC | PRN
  Administered 2024-05-29: 42 mL via OPHTHALMIC

## 2024-05-29 MED ORDER — MOXIFLOXACIN HCL 0.5 % OP SOLN
OPHTHALMIC | Status: DC | PRN
  Administered 2024-05-29: .2 mL via OPHTHALMIC

## 2024-05-29 MED ORDER — SIGHTPATH DOSE#1 BSS IO SOLN
INTRAOCULAR | Status: DC | PRN
  Administered 2024-05-29: 15 mL via INTRAOCULAR

## 2024-05-29 MED ORDER — BRIMONIDINE TARTRATE-TIMOLOL 0.2-0.5 % OP SOLN
OPHTHALMIC | Status: DC | PRN
  Administered 2024-05-29: 1 [drp] via OPHTHALMIC

## 2024-05-29 MED ORDER — ACETAZOLAMIDE ER 500 MG PO CP12: 500.0000 mg | ORAL_CAPSULE | Freq: Two times a day (BID) | ORAL | Status: DC

## 2024-05-29 MED ORDER — ONDANSETRON HCL 4 MG/2ML IJ SOLN
INTRAMUSCULAR | Status: AC
  Filled 2024-05-29: qty 2

## 2024-05-29 MED ORDER — BSS IO SOLN
INTRAOCULAR | Status: DC | PRN
  Administered 2024-05-29: 15 mL via INTRAOCULAR

## 2024-05-29 MED ORDER — CYCLOPENTOLATE HCL 2 % OP SOLN
1.0000 [drp] | OPHTHALMIC | Status: DC | PRN
  Administered 2024-05-29 (×2): 1 [drp] via OPHTHALMIC

## 2024-05-29 MED ORDER — TETRACAINE HCL 0.5 % OP SOLN
OPHTHALMIC | Status: AC
  Filled 2024-05-29: qty 4

## 2024-05-29 MED ORDER — HYDROCODONE-ACETAMINOPHEN 5-325 MG PO TABS
ORAL_TABLET | ORAL | Status: AC
  Filled 2024-05-29: qty 1

## 2024-05-29 MED ORDER — LIDOCAINE HCL (PF) 2 % IJ SOLN
INTRAOCULAR | Status: DC | PRN
  Administered 2024-05-29: 4 mL via INTRAOCULAR

## 2024-05-29 MED ORDER — PHENYLEPHRINE HCL 10 % OP SOLN
1.0000 [drp] | OPHTHALMIC | Status: DC | PRN
  Administered 2024-05-29 (×2): 1 [drp] via OPHTHALMIC

## 2024-05-29 MED ORDER — MIDAZOLAM HCL 2 MG/2ML IJ SOLN
INTRAMUSCULAR | Status: AC
  Filled 2024-05-29: qty 2

## 2024-05-29 MED ORDER — LACTATED RINGERS IV SOLN: INTRAVENOUS | Status: DC

## 2024-05-29 MED ORDER — SIGHTPATH DOSE#1 NA HYALUR & NA CHOND-NA HYALUR IO KIT
PACK | INTRAOCULAR | Status: DC | PRN
  Administered 2024-05-29: 1 via OPHTHALMIC

## 2024-05-29 SURGICAL SUPPLY — 11 items
DISSECTOR HYDRO NUCLEUS 50X22 (MISCELLANEOUS) ×1 IMPLANT
DRSG TEGADERM 2-3/8X2-3/4 SM (GAUZE/BANDAGES/DRESSINGS) ×1 IMPLANT
FEE CATARACT SUITE SIGHTPATH (MISCELLANEOUS) ×1 IMPLANT
GLOVE BIOGEL PI IND STRL 8 (GLOVE) ×1 IMPLANT
GLOVE SURG LX STRL 7.5 STRW (GLOVE) ×1 IMPLANT
GLOVE SURG SYN 6.5 PF PI BL (GLOVE) ×1 IMPLANT
LENS IOL ACRSF MP 25.5 (Intraocular Lens) IMPLANT
LENS IOL TECNIS MONO 26.5 (Intraocular Lens) IMPLANT
NDL FILTER BLUNT 18X1 1/2 (NEEDLE) ×1 IMPLANT
PACK VIT ANT 23G (MISCELLANEOUS) IMPLANT
SYR 3ML LL SCALE MARK (SYRINGE) ×1 IMPLANT

## 2024-05-29 NOTE — Transfer of Care (Signed)
 Immediate Anesthesia Transfer of Care Note  Patient: Allison Pham  Procedure(s) Performed: PHACOEMULSIFICATION, CATARACT, WITH IOL INSERTION (Left: Eye) VITRECTOMY, ANTERIOR (Left: Eye)  Patient Location: PACU  Anesthesia Type: No value filed.  Level of Consciousness: awake, alert  and patient cooperative  Airway and Oxygen Therapy: Patient Spontanous Breathing   Post-op Assessment: Post-op Vital signs reviewed, Patient's Cardiovascular Status Stable, Respiratory Function Stable, Patent Airway and No signs of Nausea or vomiting  Post-op Vital Signs: Reviewed and stable  Complications: No notable events documented.

## 2024-05-29 NOTE — H&P (Signed)
 [facility]   Primary Care Physician:  Valerio Melanie DASEN, NP Ophthalmologist: Dr. Feliciano Ober  Pre-Procedure History & Physical: HPI:  Allison Pham is a 63 y.o. female here for cataract surgery.   Past Medical History:  Diagnosis Date   Anxiety    Carotid atherosclerosis, bilateral 07/16/2018   rescan Jan 2021   Emphysema of lung (HCC) 03/21/2018   GERD (gastroesophageal reflux disease)    Hypercholesteremia    Hypertension    Tick bite    Vertigo    Wears dentures    partial upper    Past Surgical History:  Procedure Laterality Date   ABDOMINAL HYSTERECTOMY     Partial; bladder suspension   COLONOSCOPY N/A 01/28/2021   Procedure: COLONOSCOPY;  Surgeon: Jinny Carmine, MD;  Location: [facility];  Service: Endoscopy;  Laterality: N/A;   COLONOSCOPY WITH PROPOFOL  N/A 05/10/2017   Procedure: COLONOSCOPY WITH PROPOFOL ;  Surgeon: Jinny Carmine, MD;  Location: [facility];  Service: Endoscopy;  Laterality: N/A;   HEMORRHOID SURGERY     POLYPECTOMY N/A 05/10/2017   Procedure: POLYPECTOMY;  Surgeon: Jinny Carmine, MD;  Location: [facility];  Service: Endoscopy;  Laterality: N/A;   POLYPECTOMY  01/28/2021   Procedure: POLYPECTOMY;  Surgeon: Jinny Carmine, MD;  Location: [facility];  Service: Endoscopy;;    Prior to Admission medications   Medication Sig Start Date End Date Taking? Authorizing Provider  amLODipine  (NORVASC ) 5 MG tablet Take 2 tablets (10 mg total) by mouth daily. 12/25/23  Yes Cannady, [name] T, NP  aspirin 81 MG chewable tablet Chew 81 mg by mouth as needed.    Yes [provider]  busPIRone  (BUSPAR ) 5 MG tablet Take 0.5-1 tablets (2.5-5 mg total) by mouth 3 (three) times daily as needed. 12/25/23  Yes Cannady, [name] T, NP  ezetimibe  (ZETIA ) 10 MG tablet Take 1 tablet (10 mg total) by mouth daily. 12/25/23  Yes Cannady, [name] T, NP  gabapentin  (NEURONTIN ) 300 MG capsule Take 1 capsule (300 mg total) by mouth at bedtime as  needed. 12/25/23  Yes Cannady, [name] T, NP  losartan  (COZAAR ) 100 MG tablet Take 1 tablet (100 mg total) by mouth daily. 10/16/23  Yes Cannady, [name] T, NP  Multiple Vitamins-Minerals (MACULAR VITAMIN BENEFIT PO) Take by mouth daily.   Yes [provider]  rosuvastatin  (CRESTOR ) 40 MG tablet Take 1 tablet (40 mg total) by mouth daily. 12/25/23  Yes Cannady, [name] T, NP  Vitamin D , Ergocalciferol , 50000 units CAPS Take 1 capsule by mouth once a week. 12/25/23  Yes Cannady, [name] T, NP  clobetasol  cream (TEMOVATE ) 0.05 % Apply 1 Application topically 2 (two) times daily. Patient not taking: Reported on 05/20/2024 10/23/22   Cannady, [name] T, NP  estradiol  (ESTRACE ) 0.1 MG/GM vaginal cream Place 1 applicator full vaginally daily at bedtime x 2 weeks and then reduce to one applicator vaginally at bedtime twice a week only. Patient not taking: Reported on 05/20/2024 06/22/23   Cannady, [name] T, NP    Allergies as of 04/25/2024 - Review Complete 02/26/2024  Allergen Reaction Noted   Latex Rash 01/17/2021   Penicillins Rash 01/07/2015    Family History  Problem Relation Age of Onset   Cancer Mother        Colon   Hypertension Mother    Hypertension Father    Heart attack Father    Heart disease Father    Hypertension Sister    COPD Sister    Hypertension Brother  Heart disease Brother    Cancer Maternal Aunt    Breast cancer Maternal Aunt    Cancer Maternal Uncle    Breast cancer Maternal Aunt    Diabetes Neg Hx    Stroke Neg Hx     Social History   Socioeconomic History   Marital status: Divorced    Spouse name: Not on file   Number of children: 3   Years of education: Not on file   Highest education level: Not on file  Occupational History   Not on file  Tobacco Use   Smoking status: Former    Current packs/day: 0.00    Average packs/day: 1 pack/day for 38.0 years (38.0 ttl pk-yrs)    Types: Cigarettes    Start date: 09/12/1977    Quit date: 09/13/2015    Years  since quitting: 8.7   Smokeless tobacco: Never  Vaping Use   Vaping status: Never Used  Substance and Sexual Activity   Alcohol use: Never    Comment: rare - holidays   Drug use: No   Sexual activity: Not Currently    Birth control/protection: Surgical  Other Topics Concern   Not on file  Social History Narrative   Not on file   Social Drivers of Health   Financial Resource Strain: Low Risk  (02/26/2024)   Overall Financial Resource Strain (CARDIA)    Difficulty of Paying Living Expenses: Not hard at all  Food Insecurity: No Food Insecurity (02/26/2024)   Hunger Vital Sign    Worried About Running Out of Food in the Last Year: Never true    Ran Out of Food in the Last Year: Never true  Transportation Needs: No Transportation Needs (02/26/2024)   PRAPARE - Administrator, Civil Service (Medical): No    Lack of Transportation (Non-Medical): No  Physical Activity: Sufficiently Active (02/26/2024)   Exercise Vital Sign    Days of Exercise per Week: 5 days    Minutes of Exercise per Session: 30 min  Stress: No Stress Concern Present (02/26/2024)   [employer] of Occupational Health - Occupational Stress Questionnaire    Feeling of Stress: Only a little  Social Connections: Socially Isolated (02/26/2024)   Social Connection and Isolation Panel    Frequency of Communication with Friends and Family: More than three times a week    Frequency of Social Gatherings with Friends and Family: More than three times a week    Attends Religious Services: Never    Database Administrator or Organizations: No    Attends Banker Meetings: Never    Marital Status: Divorced  Catering Manager Violence: Not At Risk (02/26/2024)   Humiliation, Afraid, Rape, and Kick questionnaire    Fear of Current or Ex-Partner: No    Emotionally Abused: No    Physically Abused: No    Sexually Abused: No    Review of Systems: See HPI, otherwise negative ROS  Physical Exam: Ht 5' (1.524 m)    Wt 74.8 kg   BMI 32.22 kg/m  General:   Alert, cooperative in NAD Head:  Normocephalic and atraumatic. Respiratory:  Normal work of breathing. Cardiovascular:  RRR  Impression/Plan: Allison Pham is here for cataract surgery.  Risks, benefits, limitations, and alternatives regarding cataract surgery have been reviewed with the patient.  Questions have been answered.  All parties agreeable.   Feliciano Bryan Ober, MD  05/29/2024, 7:15 AM

## 2024-05-29 NOTE — Anesthesia Postprocedure Evaluation (Signed)
 Anesthesia Post Note  Patient: Allison Pham  Procedure(s) Performed: PHACOEMULSIFICATION, CATARACT, WITH IOL INSERTION 0.15 00:02.8 (Left: Eye) VITRECTOMY, ANTERIOR (Left: Eye)  Patient location during evaluation: PACU Anesthesia Type: MAC Level of consciousness: awake and alert Pain management: pain level controlled Vital Signs Assessment: post-procedure vital signs reviewed and stable Respiratory status: spontaneous breathing, nonlabored ventilation, respiratory function stable and patient connected to nasal cannula oxygen Cardiovascular status: stable and blood pressure returned to baseline Postop Assessment: no apparent nausea or vomiting Anesthetic complications: no Comments: Received intra-op sedation of versed and fentanyl, please see intra-op notes, versed 1 mg IV at 1157 and 1 mg IV versed at noon, fentanyl 50 mcg at 1157. Reports postop eye pain, received hydrocodone 5 mg PO.    No notable events documented.   Last Vitals:  Vitals:   05/29/24 1301 05/29/24 1314  BP: (!) 133/100 (!) 124/98  Pulse: 66 71  Resp: 14 14  Temp: (!) 36.2 C   SpO2: 97% 96%    Last Pain:  Vitals:   05/29/24 1301  TempSrc:   PainSc: 4                  Coleby Yett C Fraya Ueda

## 2024-05-29 NOTE — Anesthesia Preprocedure Evaluation (Addendum)
 Anesthesia Evaluation  Patient identified by MRN, date of birth, ID band Patient awake    Reviewed: Allergy & Precautions, H&P , NPO status , Patient's Chart, lab work & pertinent test results  Airway Mallampati: III  TM Distance: <3 FB Neck ROM: Full   Comment: Very short TMD  Dental no notable dental hx. (+) Partial Upper   Pulmonary COPD, former smoker   Pulmonary exam normal breath sounds clear to auscultation       Cardiovascular hypertension, Normal cardiovascular exam Rhythm:Regular Rate:Normal     Neuro/Psych  PSYCHIATRIC DISORDERS Anxiety Depression    negative neurological ROS  negative psych ROS   GI/Hepatic negative GI ROS, Neg liver ROS,GERD  ,,  Endo/Other  negative endocrine ROS    Renal/GU negative Renal ROS  negative genitourinary   Musculoskeletal negative musculoskeletal ROS (+)    Abdominal   Peds negative pediatric ROS (+)  Hematology negative hematology ROS (+)   Anesthesia Other Findings Medical History Was nauseated yesterday, is not sure why, would like Rx this am for nausea, so zofran  4 mg IV administered for anti-emetic Hypertension Anxiety Hypercholesteremia Tick bite GERD (gastroesophageal reflux disease) Vertigo Wears dentures  Emphysema of lung (HCC) Carotid atherosclerosis, bilateral   Postop note: cataract Procedure took a lot longer than usual; patient reports painful intra-op and postop. CRNA Alm reports that patient reported pain at the end of the case.    Reproductive/Obstetrics negative OB ROS                              Anesthesia Physical Anesthesia Plan  ASA: 2  Anesthesia Plan:    Post-op Pain Management:    Induction:   PONV Risk Score and Plan:   Airway Management Planned:   Additional Equipment:   Intra-op Plan:   Post-operative Plan:   Informed Consent:   Plan Discussed with:   Anesthesia Plan Comments:           Anesthesia Quick Evaluation

## 2024-05-29 NOTE — Op Note (Signed)
 OPERATIVE NOTE  Allison Pham [id number] 05/29/2024   PREOPERATIVE DIAGNOSIS: Nuclear sclerotic cataract left eye. H25.12   POSTOPERATIVE DIAGNOSIS: Nuclear sclerotic cataract left eye. H25.12   PROCEDURE:  Phacoemulsification with posterior chamber intraocular lens placement of the left eye  Ultrasound time: Procedure(s): PHACOEMULSIFICATION, CATARACT, WITH IOL INSERTION (Left) VITRECTOMY, ANTERIOR (Left)  LENS:   Implant Name Type Inv. Item Serial No. Manufacturer Lot No. LRB No. Used Action  LENS IOL TECNIS MONO 26.5 - [id number] Intraocular Lens LENS IOL TECNIS MONO 26.5 [id number] [employer]  Left 1 Implanted and Explanted  LENS IOL ACRSF MP 25.5 - [id number] Intraocular Lens LENS IOL ACRSF MP 25.5 [id number] [employer]  Left 1 Implanted      SURGEON:  Feliciano HERO. Enola, MD   ANESTHESIA:  Topical with tetracaine  drops, augmented with 1% preservative-free intracameral lidocaine .   COMPLICATIONS:  None.   DESCRIPTION OF PROCEDURE:  The patient was identified in the holding room and transported to the operating room and placed in the supine position under the operating microscope.  The left eye was identified as the operative eye, which was prepped and draped in the usual sterile ophthalmic fashion.   A 1 millimeter clear-corneal paracentesis was made inferotemporally. Preservative-free 1% lidocaine  mixed with 1:1,000 bisulfite-free aqueous solution of epinephrine  was injected into the anterior chamber. The anterior chamber was then filled with Viscoat viscoelastic. A 2.4 millimeter keratome was used to make a clear-corneal incision superotemporally. A curvilinear capsulorrhexis was made with a cystotome and capsulorrhexis forceps. Balanced salt solution was used to hydrodissect and hydrodelineate the nucleus. Phacoemulsification was then used to remove the lens nucleus and epinucleus.   During cataract removal, a tear was noted in the posterior capsule.  Additional viscoelastic was  placed into the anterior chamber and the instruments were removed. The main incision was hydrated until sealed.   A new paracentesis was added at the 1-2 o'clock position. Bimanual anterior vitrectomy was performed to remove the vitreous, the remaining lens particles, and lens cortex.  The capsulorrhexis was noted to be round and there was no vitreous noted in the anterior chamber (dilute 1:3 BSS:Kenalog  was used to confirm).  Wounds were checked with Weck cells and noted to be free of incarcerated vitreous strands.  The ciliary sulcus was dilated with Provisc viscoelastic. The 2.4 mm corneal wound was enlarged to approximately 3 mm for intraocular lens placement. A 10-0 Nylon suture was passed through the main incision. A +25.50  D [id number] 3-piece lens was inserted in to the ciliary sulcus.  The optic was captured through the anterior capsulorrhexis and the haptics were well-positioned in the sulcus.The 10-0 suture was tied and buried. The bimanual vitrector was used to aspirate the viscoelastic. Wounds were again checked to ensure no vitreous was present.  The lens was well centered. Timolol  and Brimonidine  drops were applied to the eye.  The patient was taken to the recovery room in stable condition.  [employer] 05/29/2024, 1:00 PM

## 2024-05-30 ENCOUNTER — Encounter: Payer: Self-pay | Admitting: Ophthalmology

## 2024-06-06 ENCOUNTER — Ambulatory Visit: Payer: Self-pay

## 2024-06-06 NOTE — Telephone Encounter (Unsigned)
 Copied from CRM #[id number]. Topic: Clinical - Red Word Triage >> Jun 06, 2024  3:06 PM Allison Pham wrote: Left ear still painful after taking antibiotics for 2 weeks Pt believes she need ear drops with antibiotics not just oral; antibiotics. >> Jun 06, 2024  3:57 PM Allison Pham wrote: Pt calling to see if antibiotics have been called in to pharmacy  [facility] - [place], [place] - [address]  [address] [place] [address]  Phone: [phone] Fax: [phone]

## 2024-06-06 NOTE — Telephone Encounter (Signed)
 FYI Only or Action Required?: Action required by provider: clinical question for provider and requesting ear drops.  Patient was last seen in primary care on 05/08/2024 by Valerio Melanie DASEN, NP.  Called Nurse Triage reporting Otalgia.  Symptoms began several weeks ago.  Interventions attempted: OTC medications: allergy relief and Prescription medications: doxycycline  and prednisone  completed.  Symptoms are: gradually worsening.  Triage Disposition: See Physician Within 24 Hours  Patient/caregiver understands and will follow disposition?: No, wishes to speak with PCP     Copied from CRM #[id number]. Topic: Clinical - Red Word Triage >> Jun 06, 2024  3:06 PM Winona R wrote: Left ear still painful after taking antibiotics for 2 weeks Pt believes she need ear drops with antibiotics not just oral; antibiotics. Reason for Disposition  [1] Taking antibiotic > 72 hours (3 days) and [2] pain persists or recurs  Answer Assessment - Initial Assessment Questions Pt states on a cruise she developed an ear infection. States she saw J. Cannady and was given an atbx that wasn't working and then a second. Rn reviewed chart , doxycycline  and prednisone  were ordered. She states the second one helped and she is taking over the counter allergy medicine. States it got slightly better but has come back worse. She thinks she needs ear drops for the infection. RN provided education on oral vs ear drop atbx. Pt denies any drainage from the ear, any fever, pain behind her ear or any other higher acuity symptoms. She is requesting ear drops be called in. Rn advised she may want her to be seen in the office first. Pt stated understanding.     1. ANTIBIOTIC: What antibiotic are you taking? How many times per day?     Finished doxycycline   2. ONSET: When was the antibiotic started?     05/16/24 3. LOCATION: Which ear is involved?     Right ear 4. PAIN: How bad is the pain?   (Scale 0-10; none, mild,  moderate or severe)     4/10 5. FEVER: Do you have a fever? If Yes, ask: What is your temperature, how was it measured, and when did it start?     denies 6. DISCHARGE: Is there any discharge? If Yes, ask: What color is it? (e.g., clear, white; yellow, green; bloody)     denies 7. OTHER SYMPTOMS: Do you have any other symptoms? (e.g., headache, stiff neck, dizziness, vomiting, runny nose)     denies  Protocols used: Ear - Otitis Media Follow-up Call-A-AH

## 2024-06-09 ENCOUNTER — Encounter: Payer: Self-pay | Admitting: Emergency Medicine

## 2024-06-09 ENCOUNTER — Ambulatory Visit
Admission: EM | Admit: 2024-06-09 | Discharge: 2024-06-09 | Disposition: A | Discharge status: Home / Self Care | Source: Home / Self Care

## 2024-06-09 DIAGNOSIS — H60502 Unspecified acute noninfective otitis externa, left ear: Secondary | ICD-10-CM | POA: Diagnosis not present

## 2024-06-09 MED ORDER — OFLOXACIN 0.3 % OT SOLN: 10.0000 [drp] | Freq: Every day | OTIC | 0 refills | Status: AC

## 2024-06-09 NOTE — Telephone Encounter (Signed)
Routing to provider to advise on patient's message.  

## 2024-06-09 NOTE — Telephone Encounter (Signed)
 Returned call to patient however she is currently at [facility] awaiting the provider. Will call back if she needs us  later.

## 2024-06-09 NOTE — ED Provider Notes (Signed)
 [facility]    CSN: [id number] Arrival date & time: 06/09/24  1331      History   Chief Complaint Chief Complaint  Patient presents with   Otalgia    HPI Allison Pham is a 63 y.o. female presenting for 1 month history of left ear pain.  Reports chronic allergy symptoms of congestion, runny nose, sneezing and sinus pressure.  Patient seen by PCP last month and treated with doxycycline  and prednisone  for otitis media.  Patient says no significant improvements with medication but her pain has overall improved.  Denies hearing loss, ear drainage.  Has been using over-the-counter eardrops and taking allergy medicine without relief.  HPI  Past Medical History:  Diagnosis Date   Anxiety    Carotid atherosclerosis, bilateral 07/16/2018   rescan Jan 2021   Emphysema of lung (HCC) 03/21/2018   GERD (gastroesophageal reflux disease)    Hypercholesteremia    Hypertension    Tick bite    Vertigo    Wears dentures    partial upper    Patient Active Problem List   Diagnosis Date Noted   Macular degeneration 05/08/2024   Otitis media, right 05/08/2024   Atrophic vaginitis 06/22/2023   Constipation 06/22/2023   Elevated hemoglobin 12/20/2022   Oral herpes 07/05/2022   Refused influenza vaccine 01/31/2022   History of colonic polyps    Polyp of descending colon    Family history of colon cancer in mother 01/10/2021   Depression, major, single episode, mild 01/09/2021   Generalized anxiety disorder 01/20/2019   Allergic rhinitis 10/17/2018   Carotid atherosclerosis, bilateral 07/16/2018   Vitamin D  deficiency 07/02/2018   Centrilobular emphysema (HCC) 03/21/2018   Obesity (BMI 30.0-34.9) 02/11/2018   Smoking history 05/15/2016   Elevated serum GGT level 05/15/2016   Spondylosis of cervical spine 07/08/2015   Varicose vein of leg 05/25/2015   Essential hypertension, benign 04/28/2015   Dyslipidemia 04/28/2015    Past Surgical History:  Procedure  Laterality Date   ABDOMINAL HYSTERECTOMY     Partial; bladder suspension   ANTERIOR VITRECTOMY Left 05/29/2024   Procedure: VITRECTOMY, ANTERIOR;  Surgeon: Enola Feliciano Hugger, MD;  Location: [facility];  Service: Ophthalmology;  Laterality: Left;   CATARACT EXTRACTION W/PHACO Left 05/29/2024   Procedure: PHACOEMULSIFICATION, CATARACT, WITH IOL INSERTION 0.15 00:02.8;  Surgeon: Enola Feliciano Hugger, MD;  Location: [facility];  Service: Ophthalmology;  Laterality: Left;   COLONOSCOPY N/A 01/28/2021   Procedure: COLONOSCOPY;  Surgeon: Jinny Carmine, MD;  Location: [facility];  Service: Endoscopy;  Laterality: N/A;   COLONOSCOPY WITH PROPOFOL  N/A 05/10/2017   Procedure: COLONOSCOPY WITH PROPOFOL ;  Surgeon: Jinny Carmine, MD;  Location: [facility];  Service: Endoscopy;  Laterality: N/A;   HEMORRHOID SURGERY     POLYPECTOMY N/A 05/10/2017   Procedure: POLYPECTOMY;  Surgeon: Jinny Carmine, MD;  Location: [facility];  Service: Endoscopy;  Laterality: N/A;   POLYPECTOMY  01/28/2021   Procedure: POLYPECTOMY;  Surgeon: Jinny Carmine, MD;  Location: [facility];  Service: Endoscopy;;    OB History   No obstetric history on file.      Home Medications    Prior to Admission medications  Medication Sig Start Date End Date Taking? Authorizing Provider  ofloxacin  (FLOXIN ) 0.3 % OTIC solution Place 10 drops into the left ear daily for 7 days. 06/09/24 06/16/24 Yes Arvis Jolan NOVAK, PA-C  amLODipine  (NORVASC ) 5 MG tablet Take 2 tablets (10 mg total) by mouth daily. 12/25/23  Cannady, [name] T, NP  aspirin 81 MG chewable tablet Chew 81 mg by mouth as needed.     [provider]  busPIRone  (BUSPAR ) 5 MG tablet Take 0.5-1 tablets (2.5-5 mg total) by mouth 3 (three) times daily as needed. 12/25/23   Cannady, [name] T, NP  clobetasol  cream (TEMOVATE ) 0.05 % Apply 1 Application topically 2 (two) times daily. Patient not taking: Reported on 05/20/2024  10/23/22   Cannady, [name] T, NP  estradiol  (ESTRACE ) 0.1 MG/GM vaginal cream Place 1 applicator full vaginally daily at bedtime x 2 weeks and then reduce to one applicator vaginally at bedtime twice a week only. Patient not taking: Reported on 05/20/2024 06/22/23   Cannady, [name] T, NP  ezetimibe  (ZETIA ) 10 MG tablet Take 1 tablet (10 mg total) by mouth daily. 12/25/23   Cannady, [name] T, NP  gabapentin  (NEURONTIN ) 300 MG capsule Take 1 capsule (300 mg total) by mouth at bedtime as needed. 12/25/23   Cannady, [name] T, NP  losartan  (COZAAR ) 100 MG tablet Take 1 tablet (100 mg total) by mouth daily. 10/16/23   Cannady, [name] T, NP  Multiple Vitamins-Minerals (MACULAR VITAMIN BENEFIT PO) Take by mouth daily.    [provider]  rosuvastatin  (CRESTOR ) 40 MG tablet Take 1 tablet (40 mg total) by mouth daily. 12/25/23   Cannady, [name] T, NP  Vitamin D , Ergocalciferol , 50000 units CAPS Take 1 capsule by mouth once a week. 12/25/23   Cannady, [name] T, NP    Family History Family History  Problem Relation Age of Onset   Cancer Mother        Colon   Hypertension Mother    Hypertension Father    Heart attack Father    Heart disease Father    Hypertension Sister    COPD Sister    Hypertension Brother    Heart disease Brother    Cancer Maternal Aunt    Breast cancer Maternal Aunt    Cancer Maternal Uncle    Breast cancer Maternal Aunt    Diabetes Neg Hx    Stroke Neg Hx     Social History Social History[1]   Allergies   Latex and Penicillins   Review of Systems Review of Systems  Constitutional:  Negative for chills, diaphoresis, fatigue and fever.  HENT:  Positive for congestion, ear pain, rhinorrhea, sinus pressure and sinus pain. Negative for ear discharge, hearing loss and sore throat.   Respiratory:  Negative for cough.   Cardiovascular:  Negative for chest pain.  Gastrointestinal:  Negative for nausea and vomiting.  Skin:  Negative for rash.  Allergic/Immunologic:  Positive for environmental allergies.  Neurological:  Negative for dizziness, weakness and headaches.  Hematological:  Negative for adenopathy.     Physical Exam Triage Vital Signs ED Triage Vitals  Encounter Vitals Group     BP 06/09/24 1345 126/83     Girls Systolic BP Percentile --      Girls Diastolic BP Percentile --      Boys Systolic BP Percentile --      Boys Diastolic BP Percentile --      Pulse Rate 06/09/24 1345 74     Resp 06/09/24 1345 15     Temp 06/09/24 1345 98.3 F (36.8 C)     Temp Source 06/09/24 1345 Oral     SpO2 06/09/24 1345 99 %     Weight 06/09/24 1342 167 lb (75.8 kg)     Height --      Head Circumference --  Peak Flow --      Pain Score 06/09/24 1343 3     Pain Loc --      Pain Education --      Exclude from Growth Chart --    No data found.  Updated Vital Signs BP 126/83 (BP Location: Left Arm)   Pulse 74   Temp 98.3 F (36.8 C) (Oral)   Resp 15   Wt 167 lb (75.8 kg)   SpO2 99%   BMI 32.61 kg/m    Physical Exam Vitals and nursing note reviewed.  Constitutional:      General: She is not in acute distress.    Appearance: Normal appearance. She is not ill-appearing or toxic-appearing.  HENT:     Head: Normocephalic and atraumatic.     Right Ear: Tympanic membrane, ear canal and external ear normal.     Left Ear: External ear normal. Drainage present.     Ears:     Comments: Thick white drainage in left EAC    Nose: Nose normal.     Mouth/Throat:     Mouth: Mucous membranes are moist.     Pharynx: Oropharynx is clear.  Eyes:     General: No scleral icterus.       Right eye: No discharge.        Left eye: No discharge.     Conjunctiva/sclera: Conjunctivae normal.  Cardiovascular:     Rate and Rhythm: Normal rate and regular rhythm.     Heart sounds: Normal heart sounds.  Pulmonary:     Effort: Pulmonary effort is normal. No respiratory distress.     Breath sounds: Normal breath sounds.  Musculoskeletal:     Cervical  back: Neck supple.  Skin:    General: Skin is dry.  Neurological:     General: No focal deficit present.     Mental Status: She is alert. Mental status is at baseline.     Motor: No weakness.     Gait: Gait normal.  Psychiatric:        Mood and Affect: Mood normal.        Behavior: Behavior normal.      UC Treatments / Results  Labs (all labs ordered are listed, but only abnormal results are displayed) Labs Reviewed - No data to display  EKG   Radiology No results found.  Procedures Procedures (including critical care time)  Medications Ordered in UC Medications - No data to display  Initial Impression / Assessment and Plan / UC Course  I have reviewed the triage vital signs and the nursing notes.  Pertinent labs & imaging results that were available during my care of the patient were reviewed by me and considered in my medical decision making (see chart for details).   63 y/o female presents for left ear pain x 1 month.  Had congestion, runny nose and sinus pressure related to allergies.  Treated last month with doxycycline  and prednisone .  Has also tried eardrops over-the-counter and antihistamines.  On evaluation she has thick white drainage in the left ear canal.  Unable to see the left TM.  Will perform otic lavage and reevaluate.  After otic lavage, TM normal-appearing without erythema, effusion or bulging.  She does have inflamed and swollen left EAC white exudates adhered to walls of EAC.  Suspect otitis externa.  Will treat at this time with ofloxacin  eardrops and have her continue over-the-counter meds, rest and fluids.  Overall supportive care discussed.  Patient overall displeased  with the wait time today.  I explained to patient that the clinic is very busy today and thanked her for waiting.  She did not have an appointment. Patient said she did not see many people.  I explained all the rooms were full.  She told nursing staff she was going to write a bad  review because she had to wait for a long time to be seen.   Final Clinical Impressions(s) / UC Diagnoses   Final diagnoses:  Acute otitis externa of left ear, unspecified type   Discharge Instructions   None    ED Prescriptions     Medication Sig Dispense Auth. Provider   ofloxacin  (FLOXIN ) 0.3 % OTIC solution Place 10 drops into the left ear daily for 7 days. 5 mL Arvis Jolan NOVAK, PA-C      PDMP not reviewed this encounter.     [1]  Social History Tobacco Use   Smoking status: Former    Current packs/day: 0.00    Average packs/day: 1 pack/day for 38.0 years (38.0 ttl pk-yrs)    Types: Cigarettes    Start date: 09/12/1977    Quit date: 09/13/2015    Years since quitting: 8.7   Smokeless tobacco: Never  Vaping Use   Vaping status: Never Used  Substance Use Topics   Alcohol use: Never    Comment: rare - holidays   Drug use: No     Arvis Jolan NOVAK, PA-C 06/09/24 1501

## 2024-06-09 NOTE — ED Triage Notes (Addendum)
 Pt c/o left ear pain x 1 month. Pt was seen twice by her PCP and prescribed abx and prednisone , but it has not helped.

## 2024-06-26 NOTE — Patient Instructions (Signed)
 Be Involved in Caring For Your Health:  Taking Medications When medications are taken as directed, they can greatly improve your health. But if they are not taken as prescribed, they may not work. In some cases, not taking them correctly can be harmful. To help ensure your treatment remains effective and safe, understand your medications and how to take them. Bring your medications to each visit for review by your provider.  Your lab results, notes, and after visit summary will be available on My Chart. We strongly encourage you to use this feature. If lab results are abnormal the clinic will contact you with the appropriate steps. If the clinic does not contact you assume the results are satisfactory. You can always view your results on My Chart. If you have questions regarding your health or results, please contact the clinic during office hours. You can also ask questions on My Chart.  We at [facility] are grateful that you chose us  to provide your care. We strive to provide evidence-based and compassionate care and are always looking for feedback. If you get a survey from the clinic please complete this so we can hear your opinions.  Healthy Eating, Adult Healthy eating may help you get and keep a healthy body weight, reduce the risk of chronic disease, and live a long and productive life. It is important to follow a healthy eating pattern. Your nutritional and calorie needs should be met mainly by different nutrient-rich foods. What are tips for following this plan? Reading food labels Read labels and choose the following: Reduced or low sodium products. Juices with 100% fruit juice. Foods with low saturated fats (<3 g per serving) and high polyunsaturated and monounsaturated fats. Foods with whole grains, such as whole wheat, cracked wheat, brown rice, and wild rice. Whole grains that are fortified with folic acid. This is recommended for females who are pregnant or who want to  become pregnant. Read labels and do not eat or drink the following: Foods or drinks with added sugars. These include foods that contain brown sugar, corn sweetener, corn syrup, dextrose , fructose, glucose, high-fructose corn syrup, honey, invert sugar, lactose, malt syrup, maltose, molasses, raw sugar, sucrose, trehalose, or turbinado sugar. Limit your intake of added sugars to less than 10% of your total daily calories. Do not eat more than the following amounts of added sugar per day: 6 teaspoons (25 g) for females. 9 teaspoons (38 g) for males. Foods that contain processed or refined starches and grains. Refined grain products, such as white flour, degermed cornmeal, white bread, and white rice. Shopping Choose nutrient-rich snacks, such as vegetables, whole fruits, and nuts. Avoid high-calorie and high-sugar snacks, such as potato chips, fruit snacks, and candy. Use oil-based dressings and spreads on foods instead of solid fats such as butter, margarine, sour cream, or cream cheese. Limit pre-made sauces, mixes, and instant products such as flavored rice, instant noodles, and ready-made pasta. Try more plant-protein sources, such as tofu, tempeh, black beans, edamame, lentils, nuts, and seeds. Explore eating plans such as the Mediterranean diet or vegetarian diet. Try heart-healthy dips made with beans and healthy fats like hummus and guacamole. Vegetables go great with these. Cooking Use oil to saut or stir-fry foods instead of solid fats such as butter, margarine, or lard. Try baking, boiling, grilling, or broiling instead of frying. Remove the fatty part of meats before cooking. Steam vegetables in water  or broth. Meal planning  At meals, imagine dividing your plate into fourths: One-half of  your plate is fruits and vegetables. One-fourth of your plate is whole grains. One-fourth of your plate is protein, especially lean meats, poultry, eggs, tofu, beans, or nuts. Include low-fat  dairy as part of your daily diet. Lifestyle Choose healthy options in all settings, including home, work, school, restaurants, or stores. Prepare your food safely: Wash your hands after handling raw meats. Where you prepare food, keep surfaces clean by regularly washing with hot, soapy water . Keep raw meats separate from ready-to-eat foods, such as fruits and vegetables. Cook seafood, meat, poultry, and eggs to the recommended temperature. Get a food thermometer. Store foods at safe temperatures. In general: Keep cold foods at 84F (4.4C) or below. Keep hot foods at 184F (60C) or above. Keep your freezer at [facility] (-17.8C) or below. Foods are not safe to eat if they have been between the temperatures of 40-184F (4.4-60C) for more than 2 hours. What foods should I eat? Fruits Aim to eat 1-2 cups of fresh, canned (in natural juice), or frozen fruits each day. One cup of fruit equals 1 small apple, 1 large banana, 8 large strawberries, 1 cup (237 g) canned fruit,  cup (82 g) dried fruit, or 1 cup (240 mL) 100% juice. Vegetables Aim to eat 2-4 cups of fresh and frozen vegetables each day, including different varieties and colors. One cup of vegetables equals 1 cup (91 g) broccoli or cauliflower florets, 2 medium carrots, 2 cups (150 g) raw, leafy greens, 1 large tomato, 1 large bell pepper, 1 large sweet potato, or 1 medium white potato. Grains Aim to eat 5-10 ounce-equivalents of whole grains each day. Examples of 1 ounce-equivalent of grains include 1 slice of bread, 1 cup (40 g) ready-to-eat cereal, 3 cups (24 g) popcorn, or  cup (93 g) cooked rice. Meats and other proteins Try to eat 5-7 ounce-equivalents of protein each day. Examples of 1 ounce-equivalent of protein include 1 egg,  oz nuts (12 almonds, 24 pistachios, or 7 walnut halves), 1/4 cup (90 g) cooked beans, 6 tablespoons (90 g) hummus or 1 tablespoon (16 g) peanut butter. A cut of meat or fish that is the size of a deck of  cards is about 3-4 ounce-equivalents (85 g). Of the protein you eat each week, try to have at least 8 sounce (227 g) of seafood. This is about 2 servings per week. This includes salmon, trout, herring, sardines, and anchovies. Dairy Aim to eat 3 cup-equivalents of fat-free or low-fat dairy each day. Examples of 1 cup-equivalent of dairy include 1 cup (240 mL) milk, 8 ounces (250 g) yogurt, 1 ounces (44 g) natural cheese, or 1 cup (240 mL) fortified soy milk. Fats and oils Aim for about 5 teaspoons (21 g) of fats and oils per day. Choose monounsaturated fats, such as canola and olive oils, mayonnaise made with olive oil or avocado oil, avocados, peanut butter, and most nuts, or polyunsaturated fats, such as sunflower, corn, and soybean oils, walnuts, pine nuts, sesame seeds, sunflower seeds, and flaxseed. Beverages Aim for 6 eight-ounce glasses of water  per day. Limit coffee to 3-5 eight-ounce cups per day. Limit caffeinated beverages that have added calories, such as soda and energy drinks. If you drink alcohol: Limit how much you have to: 0-1 drink a day if you are female. 0-2 drinks a day if you are female. Know how much alcohol is in your drink. In the [place] one drink is one 12 oz bottle of beer (355 mL), one 5 oz glass of wine (  148 mL), or one 1 oz glass of hard liquor (44 mL). Seasoning and other foods Try not to add too much salt to your food. Try using herbs and spices instead of salt. Try not to add sugar to food. This information is based on [place] nutrition guidelines. To learn more, visit [employer]. Exact amounts may vary. You may need different amounts. This information is not intended to replace advice given to you by your health care provider. Make sure you discuss any questions you have with your health care provider. Document Revised: 03/13/2022 Document Reviewed: 03/13/2022 Elsevier Patient Education  2024 [employer].

## 2024-06-30 ENCOUNTER — Ambulatory Visit (INDEPENDENT_AMBULATORY_CARE_PROVIDER_SITE_OTHER): Admitting: Nurse Practitioner

## 2024-06-30 ENCOUNTER — Encounter: Payer: Self-pay | Admitting: Nurse Practitioner

## 2024-06-30 VITALS — BP 120/80 | HR 63 | Temp 97.5°F | Resp 16 | Ht 60.0 in | Wt 167.0 lb

## 2024-06-30 DIAGNOSIS — R748 Abnormal levels of other serum enzymes: Secondary | ICD-10-CM

## 2024-06-30 DIAGNOSIS — J432 Centrilobular emphysema: Secondary | ICD-10-CM

## 2024-06-30 DIAGNOSIS — D582 Other hemoglobinopathies: Secondary | ICD-10-CM | POA: Diagnosis not present

## 2024-06-30 DIAGNOSIS — E559 Vitamin D deficiency, unspecified: Secondary | ICD-10-CM

## 2024-06-30 DIAGNOSIS — E785 Hyperlipidemia, unspecified: Secondary | ICD-10-CM | POA: Diagnosis not present

## 2024-06-30 DIAGNOSIS — F411 Generalized anxiety disorder: Secondary | ICD-10-CM | POA: Diagnosis not present

## 2024-06-30 DIAGNOSIS — E66811 Obesity, class 1: Secondary | ICD-10-CM

## 2024-06-30 DIAGNOSIS — I10 Essential (primary) hypertension: Secondary | ICD-10-CM

## 2024-06-30 DIAGNOSIS — F320 Major depressive disorder, single episode, mild: Secondary | ICD-10-CM | POA: Diagnosis not present

## 2024-06-30 LAB — MICROALBUMIN, URINE WAIVED
Creatinine, Urine Waived: 300 mg/dL (ref 10–300)
Microalb, Ur Waived: 150 mg/L — ABNORMAL HIGH (ref 0–19)

## 2024-06-30 NOTE — Assessment & Plan Note (Signed)
 Ongoing, is a past smoker. Recheck today and if continued elevations may send to hematology for further assessment.

## 2024-06-30 NOTE — Progress Notes (Signed)
 "  BP 120/80 (BP Location: Left Arm, Patient Position: Sitting, Cuff Size: Large)   Pulse 63   Temp (!) 97.5 F (36.4 C) (Oral)   Resp 16   Ht 5' (1.524 m)   Wt 167 lb (75.8 kg)   SpO2 94%   BMI 32.61 kg/m    Subjective:    Patient ID: Allison Pham, female    DOB: 11-Jun-1961, 64 y.o.   MRN: [id number]  HPI: Allison Pham is a 64 y.o. female  Chief Complaint  Patient presents with   Follow-up    Here for a follow up visit. No major concerns at the moment    HYPERTENSION/HLD Takes Amlodipine  10 MG daily, Losartan  100 MG daily, Crestor  40 MG daily, and Zetia . Previously took Pravastatin , but could not get to goal with this.  Atorvastatin  caused side effects for patient.  Satisfied with current treatment? yes Duration of hypertension: chronic BP monitoring frequency: daily BP range: on average <130/80 BP medication side effects: no Past BP meds: multiple kinds Duration of hyperlipidemia: chronic Cholesterol medication side effects: no Cholesterol supplements: none Past cholesterol medications: multiple different kinds Medication compliance: good compliance Aspirin: no Recent stressors: no Recurrent headaches: no Visual changes: no Palpitations: no Dyspnea: no Chest pain: no Lower extremity edema: no Dizzy/lightheaded: no  The 10-year ASCVD risk score (Arnett DK, et al., 2019) is: 6.3%   Values used to calculate the score:     Age: 1 years     Clinically relevant sex: Female     Is Non-Hispanic African American: No     Diabetic: No     Tobacco smoker: No     Systolic Blood Pressure: 120 mmHg     Is BP treated: Yes     HDL Cholesterol: 38 mg/dL     Total Cholesterol: 163 mg/dL  COPD Past smoker. Was a 1 PPD smoker in past. Quit >6 years ago.  No current symptoms or inhalers.  Smokers are present in her household. Centrilobular mild noted on lung CT 2019, has not gone for return screening as recommended. Taking Vitamin D  for history of low levels. Occasional  elevations on hemoglobin and GGT on labs. COPD status: stable Satisfied with current treatment?: yes Oxygen use: no Dyspnea frequency:  none Cough frequency: none Limitation of activity: no Productive cough: no Last Spirometry: unknown Pneumovax:  refuses Influenza:  refuses  ANXIETY/DEPRESSION Takes Buspar  and Gabapentin . Has been sewing which offers her relaxation. Mood status: stable Satisfied with current treatment?: yes Symptom severity: moderate  Duration of current treatment : chronic Side effects: no Medication compliance: good compliance Psychotherapy/counseling: none Depressed mood: no Anxious mood: occasional Anhedonia: occasional Significant weight loss or gain: no Insomnia: no Fatigue: no Feelings of worthlessness or guilt: no Impaired concentration/indecisiveness: no Suicidal ideations: no Hopelessness: no Crying spells: no    06/30/2024   10:14 AM 05/08/2024   11:09 AM 02/26/2024    4:15 PM 12/25/2023   10:46 AM 06/22/2023   10:59 AM  Depression screen PHQ 2/9  Decreased Interest 1 1 0 1 1  Down, Depressed, Hopeless 1 1 1 1 2   PHQ - 2 Score 2 2 1 2 3   Altered sleeping 0 1 1 1 1   Tired, decreased energy 1 1 0 1 2  Change in appetite 0 0 1 2 1   Feeling bad or failure about yourself  1 0 0 1 1  Trouble concentrating 2 1 0 2 1  Moving slowly or fidgety/restless 0 0 0  0 0  Suicidal thoughts 0 0 0 0 0  PHQ-9 Score 6 5 3  9  9    Difficult doing work/chores  Not difficult at all Not difficult at all Not difficult at all Somewhat difficult     Data saved with a previous flowsheet row definition       06/30/2024   10:15 AM 05/08/2024   11:10 AM 12/25/2023   10:47 AM 06/22/2023   10:59 AM  GAD 7 : Generalized Anxiety Score  Nervous, Anxious, on Edge 2 1 1 1   Control/stop worrying 1 1 1 1   Worry too much - different things 1 1 1 1   Trouble relaxing 1 1 1 2   Restless 0 0 0 1  Easily annoyed or irritable 1 0 1 1  Afraid - awful might happen 0 0 0 1  Total  GAD 7 Score 6 4 5 8   Anxiety Difficulty  Not difficult at all Not difficult at all Somewhat difficult   Relevant past medical, surgical, family and social history reviewed and updated as indicated. Interim medical history since our last visit reviewed. Allergies and medications reviewed and updated.  Review of Systems  Constitutional:  Negative for activity change, appetite change, diaphoresis, fatigue and fever.  Respiratory:  Negative for cough, chest tightness, shortness of breath and wheezing.   Cardiovascular:  Negative for chest pain, palpitations and leg swelling.  Gastrointestinal: Negative.   Neurological: Negative.   Psychiatric/Behavioral: Negative.      Per HPI unless specifically indicated above     Objective:    BP 120/80 (BP Location: Left Arm, Patient Position: Sitting, Cuff Size: Large)   Pulse 63   Temp (!) 97.5 F (36.4 C) (Oral)   Resp 16   Ht 5' (1.524 m)   Wt 167 lb (75.8 kg)   SpO2 94%   BMI 32.61 kg/m   Wt Readings from Last 3 Encounters:  06/30/24 167 lb (75.8 kg)  06/09/24 167 lb (75.8 kg)  05/29/24 163 lb 2.3 oz (74 kg)    Physical Exam Vitals and nursing note reviewed.  Constitutional:      General: She is awake. She is not in acute distress.    Appearance: Normal appearance. She is well-developed and well-groomed. She is obese. She is not ill-appearing or toxic-appearing.  HENT:     Head: Normocephalic.     Right Ear: Hearing and external ear normal.     Left Ear: Hearing and external ear normal.  Eyes:     General: Lids are normal.        Right eye: No discharge.        Left eye: No discharge.     Conjunctiva/sclera: Conjunctivae normal.     Pupils: Pupils are equal, round, and reactive to light.  Neck:     Thyroid : No thyromegaly.     Vascular: No carotid bruit.  Cardiovascular:     Rate and Rhythm: Normal rate and regular rhythm.     Heart sounds: Normal heart sounds. No murmur heard.    No gallop.  Pulmonary:     Effort:  Pulmonary effort is normal. No accessory muscle usage or respiratory distress.     Breath sounds: Normal breath sounds. No decreased breath sounds, wheezing or rales.  Abdominal:     General: Bowel sounds are normal. There is no distension.     Palpations: Abdomen is soft.     Tenderness: There is no abdominal tenderness.  Musculoskeletal:     Cervical  back: Normal range of motion and neck supple.     Right lower leg: No edema.     Left lower leg: No edema.  Lymphadenopathy:     Cervical: No cervical adenopathy.  Skin:    General: Skin is warm and dry.  Neurological:     Mental Status: She is alert and oriented to person, place, and time.     Deep Tendon Reflexes: Reflexes are normal and symmetric.     Reflex Scores:      Brachioradialis reflexes are 2+ on the right side and 2+ on the left side.      Patellar reflexes are 2+ on the right side and 2+ on the left side. Psychiatric:        Attention and Perception: Attention normal.        Mood and Affect: Mood normal.        Speech: Speech normal.        Behavior: Behavior normal. Behavior is cooperative.        Thought Content: Thought content normal.    Results for orders placed or performed in visit on 12/25/23  Comprehensive metabolic panel with GFR   Collection Time: 12/25/23 11:03 AM  Result Value Ref Range   Glucose 98 70 - 99 mg/dL   BUN 18 8 - 27 mg/dL   Creatinine, Ser 9.03 0.57 - 1.00 mg/dL   eGFR 66 >40 fO/fpw/8.26   BUN/Creatinine Ratio 19 12 - 28   Sodium 141 134 - 144 mmol/L   Potassium 4.0 3.5 - 5.2 mmol/L   Chloride 105 96 - 106 mmol/L   CO2 20 20 - 29 mmol/L   Calcium  9.5 8.7 - 10.3 mg/dL   Total Protein 6.7 6.0 - 8.5 g/dL   Albumin 4.2 3.9 - 4.9 g/dL   Globulin, Total 2.5 1.5 - 4.5 g/dL   Bilirubin Total 0.5 0.0 - 1.2 mg/dL   Alkaline Phosphatase 129 (H) 44 - 121 IU/L   AST 23 0 - 40 IU/L   ALT 28 0 - 32 IU/L  Lipid Panel w/o Chol/HDL Ratio   Collection Time: 12/25/23 11:03 AM  Result Value Ref  Range   Cholesterol, Total 163 100 - 199 mg/dL   Triglycerides 844 (H) 0 - 149 mg/dL   HDL 38 (L) >60 mg/dL   VLDL Cholesterol Cal 27 5 - 40 mg/dL   LDL Chol Calc (NIH) 98 0 - 99 mg/dL  TSH   Collection Time: 12/25/23 11:03 AM  Result Value Ref Range   TSH 2.180 0.450 - 4.500 uIU/mL  VITAMIN D  25 Hydroxy (Vit-D Deficiency, Fractures)   Collection Time: 12/25/23 11:03 AM  Result Value Ref Range   Vit D, 25-Hydroxy 84.0 30.0 - 100.0 ng/mL  Gamma GT   Collection Time: 12/25/23 11:03 AM  Result Value Ref Range   GGT 188 (H) 0 - 60 IU/L  CBC (STAT)   Collection Time: 12/25/23 11:13 AM  Result Value Ref Range   WBC 9.3 3.4 - 10.8 x10E3/uL   RBC 5.54 (H) 3.77 - 5.28 x10E6/uL   Hemoglobin 16.3 (H) 11.1 - 15.9 g/dL   Hematocrit 53.0 (H) 65.9 - 46.6 %   MCV 85 79 - 97 fL   MCH 29.4 26.6 - 33.0 pg   MCHC 34.8 31.5 - 35.7 g/dL   RDW 85.6 88.2 - 84.5 %   Platelets 123 (L) 150 - 450 x10E3/uL      Assessment & Plan:   Problem List Items Addressed This Visit  Cardiovascular and Mediastinum   Essential hypertension, benign (Chronic)   Chronic, stable.  BP at goal in office and on home checks.  Recommend she monitor BP at least a few mornings a week at home and document.  DASH diet at home.  Continue current medication regimen and adjust as needed, may be able to reduce Amlodipine  back down in future if BP remains stable.  Labs: CBC, TSH, CMP. Urine ALB 150 January 2025, maintain ARB on board.  Refills up to date.  Return in 6 months.      Relevant Orders   Microalbumin, Urine Waived   CBC with Differential/Platelet   Comprehensive metabolic panel with GFR   TSH     Respiratory   Centrilobular emphysema (HCC) - Primary (Chronic)   Chronic, stable with no inhalers.  Will plan on spirometry next visit if available.  If worsening symptoms initiate inhaler regimen.  Continue yearly lung screening -- recommend she call to schedule this.      Relevant Orders   CBC with  Differential/Platelet     Other   Vitamin D  deficiency (Chronic)   Ongoing and stable.  Continue supplement and check Vit D level today.      Relevant Orders   VITAMIN D  25 Hydroxy (Vit-D Deficiency, Fractures)   Obesity (BMI 30.0-34.9) (Chronic)   BMI 32.61. Recommended eating smaller high protein, low fat meals more frequently and exercising 30 mins a day 5 times a week with a goal of 10-15lb weight loss in the next 3 months. Patient voiced their understanding and motivation to adhere to these recommendations.          Generalized anxiety disorder (Chronic)   Refer to depression plan of care.      Elevated serum GGT level (Chronic)   Check CMP and GGT today.  Consider repeat imaging in future.      Relevant Orders   Comprehensive metabolic panel with GFR   Gamma GT   Dyslipidemia (Chronic)   Chronic, ongoing.  Is tolerating Zetia  and Crestor .  Will check lipid panel and CMP today.  Adjust regimen as needed. May need to send to lipid clinic if ongoing elevations.      Relevant Orders   Comprehensive metabolic panel with GFR   Lipid Panel w/o Chol/HDL Ratio   Depression, major, single episode, mild (Chronic)   Chronic, ongoing.  Denies SI/HI.  Reports benefit from current regimen, will continue this and send refills as needed.  Recommend therapy visits as needed.  Return in 6 months.      Relevant Orders   TSH   Elevated hemoglobin   Ongoing, is a past smoker. Recheck today and if continued elevations may send to hematology for further assessment.      Relevant Orders   CBC with Differential/Platelet     Follow up plan: Return in about 31 weeks (around 02/02/2025) for Annual Physical after 02/01/24.       "

## 2024-06-30 NOTE — Assessment & Plan Note (Signed)
 Chronic, ongoing.  Is tolerating Zetia  and Crestor .  Will check lipid panel and CMP today.  Adjust regimen as needed. May need to send to lipid clinic if ongoing elevations.

## 2024-06-30 NOTE — Assessment & Plan Note (Signed)
 Chronic, stable with no inhalers.  Will plan on spirometry next visit if available.  If worsening symptoms initiate inhaler regimen.  Continue yearly lung screening -- recommend she call to schedule this.

## 2024-06-30 NOTE — Assessment & Plan Note (Signed)
 Chronic, stable.  BP at goal in office and on home checks.  Recommend she monitor BP at least a few mornings a week at home and document.  DASH diet at home.  Continue current medication regimen and adjust as needed, may be able to reduce Amlodipine  back down in future if BP remains stable.  Labs: CBC, TSH, CMP. Urine ALB 150 January 2025, maintain ARB on board.  Refills up to date.  Return in 6 months.

## 2024-06-30 NOTE — Assessment & Plan Note (Signed)
Ongoing and stable.  Continue supplement and check Vit D level today. 

## 2024-06-30 NOTE — Assessment & Plan Note (Signed)
BMI 32.61.  Recommended eating smaller high protein, low fat meals more frequently and exercising 30 mins a day 5 times a week with a goal of 10-15lb weight loss in the next 3 months. Patient voiced their understanding and motivation to adhere to these recommendations. ? ?

## 2024-06-30 NOTE — Assessment & Plan Note (Signed)
 Check CMP and GGT today.  Consider repeat imaging in future.

## 2024-06-30 NOTE — Assessment & Plan Note (Signed)
 Chronic, ongoing.  Denies SI/HI.  Reports benefit from current regimen, will continue this and send refills as needed.  Recommend therapy visits as needed.  Return in 6 months.

## 2024-06-30 NOTE — Assessment & Plan Note (Signed)
 Refer to depression plan of care.

## 2024-07-01 ENCOUNTER — Ambulatory Visit: Payer: Self-pay | Admitting: Nurse Practitioner

## 2024-07-01 LAB — COMPREHENSIVE METABOLIC PANEL WITH GFR
ALT: 59 IU/L — ABNORMAL HIGH (ref 0–32)
AST: 44 IU/L — ABNORMAL HIGH (ref 0–40)
Albumin: 4.4 g/dL (ref 3.9–4.9)
Alkaline Phosphatase: 121 IU/L (ref 49–135)
BUN/Creatinine Ratio: 26 (ref 12–28)
BUN: 28 mg/dL — ABNORMAL HIGH (ref 8–27)
Bilirubin Total: 0.6 mg/dL (ref 0.0–1.2)
CO2: 23 mmol/L (ref 20–29)
Calcium: 9.6 mg/dL (ref 8.7–10.3)
Chloride: 109 mmol/L — ABNORMAL HIGH (ref 96–106)
Creatinine, Ser: 1.07 mg/dL — ABNORMAL HIGH (ref 0.57–1.00)
Globulin, Total: 2 g/dL (ref 1.5–4.5)
Glucose: 96 mg/dL (ref 70–99)
Potassium: 4.3 mmol/L (ref 3.5–5.2)
Sodium: 144 mmol/L (ref 134–144)
Total Protein: 6.4 g/dL (ref 6.0–8.5)
eGFR: 58 mL/min/1.73 — ABNORMAL LOW

## 2024-07-01 LAB — CBC WITH DIFFERENTIAL/PLATELET
Basophils Absolute: 0.1 x10E3/uL (ref 0.0–0.2)
Basos: 1 %
EOS (ABSOLUTE): 0.2 x10E3/uL (ref 0.0–0.4)
Eos: 2 %
Hematocrit: 46.7 % — ABNORMAL HIGH (ref 34.0–46.6)
Hemoglobin: 15.5 g/dL (ref 11.1–15.9)
Immature Grans (Abs): 0 x10E3/uL (ref 0.0–0.1)
Immature Granulocytes: 0 %
Lymphocytes Absolute: 4.6 x10E3/uL — ABNORMAL HIGH (ref 0.7–3.1)
Lymphs: 47 %
MCH: 29.3 pg (ref 26.6–33.0)
MCHC: 33.2 g/dL (ref 31.5–35.7)
MCV: 88 fL (ref 79–97)
Monocytes Absolute: 0.9 x10E3/uL (ref 0.1–0.9)
Monocytes: 9 %
Neutrophils Absolute: 4.1 x10E3/uL (ref 1.4–7.0)
Neutrophils: 41 %
Platelets: 218 x10E3/uL (ref 150–450)
RBC: 5.29 x10E6/uL — ABNORMAL HIGH (ref 3.77–5.28)
RDW: 13.8 % (ref 11.7–15.4)
WBC: 10 x10E3/uL (ref 3.4–10.8)

## 2024-07-01 LAB — LIPID PANEL W/O CHOL/HDL RATIO
Cholesterol, Total: 160 mg/dL (ref 100–199)
HDL: 57 mg/dL
LDL Chol Calc (NIH): 86 mg/dL (ref 0–99)
Triglycerides: 94 mg/dL (ref 0–149)
VLDL Cholesterol Cal: 17 mg/dL (ref 5–40)

## 2024-07-01 LAB — TSH: TSH: 2.33 u[IU]/mL (ref 0.450–4.500)

## 2024-07-01 LAB — VITAMIN D 25 HYDROXY (VIT D DEFICIENCY, FRACTURES): Vit D, 25-Hydroxy: 61.4 ng/mL (ref 30.0–100.0)

## 2024-07-01 LAB — GAMMA GT: GGT: 302 IU/L — ABNORMAL HIGH (ref 0–60)

## 2024-07-01 NOTE — Progress Notes (Signed)
 Contacted via MyChart  Good morning Jewelz, your labs have returned: - CBC is overall stable and at baseline, we will continue to monitor levels on this at visits. - Kidney function, creatinine and eGFR, is showing slight trend down this check. Any recent Ibuprofen use? If so cut back on this. Liver function, AST and ALT, is showing trend back up again too. Ensure minimal Tylenol  and alcohol use at home. We will recheck both next visit. GGT, which at times looks at gall bladder, has trended up a little. We will monitor this as well and may consider repeat ultrasound of liver in future. - Remainder of labs look great. No changes needed. Any questions? Keep being awesome!!  Thank you for allowing me to participate in your care.  I appreciate you. Kindest regards, Piper Albro

## 2025-02-03 ENCOUNTER — Ambulatory Visit: Admitting: Nurse Practitioner

## 2025-03-03 ENCOUNTER — Ambulatory Visit
